# Patient Record
Sex: Male | Born: 1950 | Race: Black or African American | Hispanic: No | Marital: Married | Smoking: Current some day smoker
Health system: Southern US, Community
[De-identification: ages and names within clinical notes are randomized; demographics above are authoritative.]

## PROBLEM LIST (undated history)

## (undated) ENCOUNTER — Ambulatory Visit: Discharge status: Absent without leave | Payer: Medicare Other

## (undated) DIAGNOSIS — I2510 Atherosclerotic heart disease of native coronary artery without angina pectoris: Secondary | ICD-10-CM

## (undated) DIAGNOSIS — K449 Diaphragmatic hernia without obstruction or gangrene: Secondary | ICD-10-CM

## (undated) DIAGNOSIS — I10 Essential (primary) hypertension: Secondary | ICD-10-CM

## (undated) DIAGNOSIS — J189 Pneumonia, unspecified organism: Secondary | ICD-10-CM

## (undated) DIAGNOSIS — D649 Anemia, unspecified: Secondary | ICD-10-CM

## (undated) DIAGNOSIS — G4730 Sleep apnea, unspecified: Secondary | ICD-10-CM

## (undated) DIAGNOSIS — R011 Cardiac murmur, unspecified: Secondary | ICD-10-CM

## (undated) DIAGNOSIS — C189 Malignant neoplasm of colon, unspecified: Secondary | ICD-10-CM

## (undated) DIAGNOSIS — K219 Gastro-esophageal reflux disease without esophagitis: Secondary | ICD-10-CM

## (undated) DIAGNOSIS — M1990 Unspecified osteoarthritis, unspecified site: Secondary | ICD-10-CM

## (undated) HISTORY — DX: Sleep apnea, unspecified: G47.30

## (undated) HISTORY — DX: Gastro-esophageal reflux disease without esophagitis: K21.9

## (undated) HISTORY — PX: COLONOSCOPY: SHX174

## (undated) HISTORY — PX: ANKLE SURGERY: SHX546

## (undated) HISTORY — DX: Unspecified osteoarthritis, unspecified site: M19.90

## (undated) HISTORY — PX: HERNIA REPAIR: SHX51

## (undated) HISTORY — PX: CIRCUMCISION: SUR203

## (undated) HISTORY — DX: Anemia, unspecified: D64.9

---

## 1999-03-15 ENCOUNTER — Ambulatory Visit: Admission: RE | Admit: 1999-03-15 | Discharge: 1999-03-15 | Payer: Self-pay | Admitting: Cardiology

## 2000-05-23 ENCOUNTER — Encounter: Admission: RE | Admit: 2000-05-23 | Discharge: 2000-05-23 | Payer: Self-pay | Admitting: Cardiology

## 2000-05-23 ENCOUNTER — Encounter: Payer: Self-pay | Admitting: Cardiology

## 2004-09-06 ENCOUNTER — Ambulatory Visit (HOSPITAL_COMMUNITY): Admission: RE | Admit: 2004-09-06 | Discharge: 2004-09-06 | Payer: Self-pay | Admitting: Cardiology

## 2005-07-15 ENCOUNTER — Emergency Department (HOSPITAL_COMMUNITY): Admission: EM | Admit: 2005-07-15 | Discharge: 2005-07-15 | Payer: Self-pay | Admitting: Family Medicine

## 2006-04-04 ENCOUNTER — Ambulatory Visit (HOSPITAL_BASED_OUTPATIENT_CLINIC_OR_DEPARTMENT_OTHER): Admission: RE | Admit: 2006-04-04 | Discharge: 2006-04-04 | Payer: Self-pay | Admitting: Otolaryngology

## 2006-04-09 ENCOUNTER — Ambulatory Visit: Payer: Self-pay | Admitting: Internal Medicine

## 2006-05-16 ENCOUNTER — Encounter (HOSPITAL_COMMUNITY): Admission: RE | Admit: 2006-05-16 | Discharge: 2006-07-06 | Payer: Self-pay | Admitting: Cardiology

## 2006-09-05 ENCOUNTER — Ambulatory Visit (HOSPITAL_COMMUNITY): Admission: RE | Admit: 2006-09-05 | Discharge: 2006-09-05 | Payer: Self-pay | Admitting: Cardiology

## 2008-02-21 ENCOUNTER — Encounter: Admission: RE | Admit: 2008-02-21 | Discharge: 2008-02-21 | Payer: Self-pay | Admitting: Cardiology

## 2008-06-11 ENCOUNTER — Encounter: Admission: RE | Admit: 2008-06-11 | Discharge: 2008-06-11 | Payer: Self-pay | Admitting: Cardiology

## 2008-10-10 ENCOUNTER — Encounter (HOSPITAL_COMMUNITY): Admission: RE | Admit: 2008-10-10 | Discharge: 2008-10-21 | Payer: Self-pay | Admitting: Cardiology

## 2009-02-19 ENCOUNTER — Inpatient Hospital Stay (HOSPITAL_BASED_OUTPATIENT_CLINIC_OR_DEPARTMENT_OTHER): Admission: RE | Admit: 2009-02-19 | Discharge: 2009-02-19 | Payer: Self-pay | Admitting: Cardiology

## 2010-06-09 ENCOUNTER — Ambulatory Visit (HOSPITAL_COMMUNITY): Admission: RE | Admit: 2010-06-09 | Discharge: 2010-06-09 | Payer: Self-pay | Admitting: Cardiology

## 2010-11-13 ENCOUNTER — Encounter: Payer: Self-pay | Admitting: Cardiology

## 2010-11-14 ENCOUNTER — Encounter: Payer: Self-pay | Admitting: Cardiology

## 2011-01-04 ENCOUNTER — Inpatient Hospital Stay (INDEPENDENT_AMBULATORY_CARE_PROVIDER_SITE_OTHER)
Admission: RE | Admit: 2011-01-04 | Discharge: 2011-01-04 | Disposition: A | Discharge status: Home / Self Care | Payer: BC Managed Care – PPO | Source: Ambulatory Visit | Attending: Family Medicine | Admitting: Family Medicine

## 2011-01-04 DIAGNOSIS — J40 Bronchitis, not specified as acute or chronic: Secondary | ICD-10-CM

## 2011-03-08 NOTE — Cardiovascular Report (Signed)
George Watkins, George Watkins                ACCOUNT NO.:  [id number]   MEDICAL RECORD NO.:  [id number]          PATIENT TYPE:  OIB   LOCATION:  1963                         FACILITY:  [facility]   PHYSICIAN:  Mohan N. Sharyn Lull, M.D. DATE OF BIRTH:  02/08/1951   DATE OF PROCEDURE:  02/19/2009  DATE OF DISCHARGE:  02/19/2009                            CARDIAC CATHETERIZATION   PROCEDURE:  Left cardiac catheterization with selective left and right  coronary angiography, left ventriculography via right groin using  Judkins technique.   INDICATIONS FOR PROCEDURE:  George Watkins is 60 year old black male with  past medical history significant for coronary artery disease,  hypertension, morbid obesity, tobacco abuse, GERD, obesity,  hypoventilation syndrome, and obstructive sleep apnea.  Complains of  retrosternal and precordial chest pain off and on associated with  diaphoresis, chest pain resolves with rest.  Also, complains of  exertional dyspnea with minimal exertion, associated with tired and weak  feeling.  Denies any palpitation, lightheadedness, or syncope.  The  patient underwent Persantine Myoview on October 10, 2008, which showed  apical and lateral infarct with no evidence of ischemia.  The patient  was initially treated medically, but due to recurrent chest pain, was  referred for left cath.   PAST MEDICAL HISTORY:  As above.   PAST SURGICAL HISTORY:  1. He had left inguinal hernia repair.  2. Had circumcision in the past.  3. Had polyps of vocal cord resected in the past.  4. Had colonoscopy and polypectomy in the past.   ALLERGIES:  Allergic to SULFA.   MEDICATION AT HOME:  He is on:  1. Bystolic 10 mg daily.  2. Tekturna HCT 300/25 daily.  3. Exforge 10/160 daily.  4. Celebrex 200 mg daily.  5. Clonidine 0.2 mg daily.  6. Enteric-coated aspirin 81 mg p.o. daily.   SOCIAL HISTORY:  He is married and has 4 children.  Chews tobacco, half  pack everyday and drinks occasionally,  socially.  Works for [employer].   FAMILY HISTORY:  Father died of complications of pneumonia at the age of  73.  Mother died of stroke at age of 55, she was hypertensive.   PHYSICAL EXAMINATION:  GENERAL:  He was alert, awake, and oriented x3,  in no acute distress.  VITAL SIGNS:  Blood pressure was 140/84.  Pulse was 53 regular.  EYES:  Conjunctiva was pink.  NECK:  Supple.  No JVD or no bruit.  LUNGS:  Clear to auscultation without rhonchi or rales.  CARDIOVASCULAR:  S1 and S2 was normal.  There was soft systolic murmur.  There was no S3 gallop.  ABDOMEN:  Soft.  Bowel sounds are present, obese, and nontender.  EXTREMITIES:  There is no clubbing, cyanosis, or edema.   EKG showed normal sinus rhythm with LVH with nonspecific T-wave changes.  I discussed with the patient at length regarding left cardiac cath risks  and benefits, i.e., death, MI, stroke, need for emergency CABG, local  vascular complications, etc., and consented for the procedure.   PROCEDURE:  After obtaining informed consent, the patient was brought to  the cath lab and was placed on fluoroscopy table.  The right groin was  prepped and draped in usual fashion.  Xylocaine 2% was used for local  anesthesia in the right groin.  With the help of thin-wall needle, a 4-  French arterial sheath was placed.  The sheath was aspirated and  flushed.  Next, a 4-French left Judkins catheter was advanced over the  wire under fluoroscopic guidance up to the ascending aorta.  Wire was  pulled out, the catheter was aspirated and connected to the manifold.  Catheter was further advanced and engaged into left coronary ostium.  Multiple views of the left system were taken.  Next, the catheter was  disengaged and was pulled out over the wire and was replaced with 4-  [place] right Judkins catheter, which was advanced over the wire under  fluoroscopic guidance up to the ascending aorta.  Wire was pulled out,  the catheter was  aspirated and connected to the manifold.  Catheter was  further advanced and engaged into right coronary ostium.  Multiple views  of the right system were taken.  Next, the catheter was disengaged and  was pulled out over the wire and was replaced with 4-French pigtail  catheter, which was advanced over the wire under fluoroscopic guidance  up to the ascending aorta.  Wire was pulled out.  The catheter was  aspirated and connected to the manifold.  Catheter was further advanced  across the aortic valve into the LV.  LV pressures were recorded.  Next,  LV graft was done in 30-degree RAO position.  Post angiographic  pressures were recorded from LV and then pullback pressures were  recorded from the aorta.  There was no gradient across the aortic valve.  Next, the pigtail catheter was pulled out over the wire.  Sheaths were  aspirated and flushed.   FINDINGS:  LV was mildly enlarged, EF of approximately 50%.  Left main  was long, which was patent.  LAD has 10-15% mid stenosis.  Diagonal one  and two were small, which were patent.  Ramus was very very small, left  circumflex has 10-15% proximal stenosis.  OM1 and OM2 were very small.  OM3 was moderate size, which was patent.  OM4 was very small.  RCA has  10% proximal stenosis.  PDA was moderate size, which was patent.  PLV  branches were small, which were patent.  The patient tolerated the  procedure well.  There were no complications.  The patient was  transferred to recovery room in stable condition.      Eduardo Osier. Sharyn Lull, M.D.  Electronically Signed     MNH/MEDQ  D:  02/19/2009  T:  02/20/2009  Job:  [id number]   cc:   Genene Churn. Love, M.D.

## 2011-03-11 ENCOUNTER — Other Ambulatory Visit (HOSPITAL_COMMUNITY): Payer: Self-pay | Admitting: Cardiology

## 2011-03-11 NOTE — Procedures (Signed)
George Watkins, REASON                ACCOUNT NO.:  [id number]   MEDICAL RECORD NO.:  [id number]          PATIENT TYPE:  OUT   LOCATION:  SLEEP CENTER                 FACILITY:  [facility]   PHYSICIAN:  Clinton D. Maple Hudson, M.D. DATE OF BIRTH:  Dec 27, 1950   DATE OF STUDY:  04/04/2006                              NOCTURNAL POLYSOMNOGRAM   REFERRING PHYSICIAN:  Dr. Hermelinda Medicus.   INDICATIONS FOR STUDY:  Hypersomnia with sleep apnea.   EPWORTH SLEEPINESS SCORE:  17/24.   BMI:  40.9.   WEIGHT:  283 pounds.   HOME MEDICATIONS:  Diovan, labetalol, Tekturna, aspirin.   SLEEP ARCHITECTURE:  Total sleep time 303 minutes with sleep efficiency 74%.  Stage I was 8%, stage II 64%, stages III and IV were absent, REM was 28% of  total sleep time.  Sleep latency 31 minutes, REM latency 58 minutes, awake  after sleep onset 63 minutes arousal index 9.9.   No bedtime medication was taken.   RESPIRATORY DATA:  Split study protocol.  Apnea/hypopnea index (AHI, RDI)  50.3 obstructive events per hour indicating severe obstructive sleep  apnea/hypopnea syndrome before CPAP.  This included 20 obstructive apneas  and 97 hypopneas before CPAP.  He slept only supine and all events were  recorded in that position.  REM AHI 84.4.  CPAP was titrated to 19 CWP, AHI  0 per hour.  A large ResMed Quattro full-face mask was used with heated  humidifier.   OXYGEN DATA:  Moderate snoring with oxygen desaturation to a nadir of 70%  before CPAP.  After CPAP control saturation held 93-94% on room air.   CARDIAC DATA:  Sinus rhythm with occasional PAC and PVC.   MOVEMENT/PARASOMNIA:  Occasional limb jerk with arousal, insignificant.   IMPRESSION/RECOMMENDATIONS:  1.  Severe obstructive sleep apnea/hypopnea syndrome, AHI 50.3 per hour with      moderately loud snoring and oxygen desaturation to 70%.  All sleep and      events were while supine.  2.  Successful CPAP titration to 19 CWP, AHI 0 per hour.  A large ResMed     Quattro full-face mask was used with heated humidifier.      Clinton D. Maple Hudson, M.D.  Diplomate, Biomedical engineer of Sleep Medicine  Electronically Signed     CDY/MEDQ  D:  04/09/2006 09:42:39  T:  04/10/2006 10:36:29  Job:  [id number]

## 2011-03-25 ENCOUNTER — Encounter (HOSPITAL_COMMUNITY)
Admission: RE | Admit: 2011-03-25 | Discharge: 2011-03-25 | Disposition: A | Discharge status: Home / Self Care | Payer: BC Managed Care – PPO | Source: Ambulatory Visit | Attending: Cardiology | Admitting: Cardiology

## 2011-03-25 DIAGNOSIS — R079 Chest pain, unspecified: Secondary | ICD-10-CM | POA: Insufficient documentation

## 2011-03-25 MED ORDER — TECHNETIUM TC 99M TETROFOSMIN IV KIT
10.0000 | PACK | Freq: Once | INTRAVENOUS | Status: AC | PRN
  Administered 2011-03-25: 10 via INTRAVENOUS

## 2011-03-25 MED ORDER — TECHNETIUM TC 99M TETROFOSMIN IV KIT
30.0000 | PACK | Freq: Once | INTRAVENOUS | Status: AC | PRN
  Administered 2011-03-25: 30 via INTRAVENOUS

## 2011-03-31 ENCOUNTER — Inpatient Hospital Stay (HOSPITAL_BASED_OUTPATIENT_CLINIC_OR_DEPARTMENT_OTHER)
Admission: RE | Admit: 2011-03-31 | Discharge: 2011-03-31 | Disposition: A | Discharge status: Home / Self Care | Payer: BC Managed Care – PPO | Source: Ambulatory Visit | Attending: Cardiology | Admitting: Cardiology

## 2011-03-31 DIAGNOSIS — E662 Morbid (severe) obesity with alveolar hypoventilation: Secondary | ICD-10-CM | POA: Insufficient documentation

## 2011-03-31 DIAGNOSIS — F17200 Nicotine dependence, unspecified, uncomplicated: Secondary | ICD-10-CM | POA: Insufficient documentation

## 2011-03-31 DIAGNOSIS — I2510 Atherosclerotic heart disease of native coronary artery without angina pectoris: Secondary | ICD-10-CM | POA: Insufficient documentation

## 2011-03-31 DIAGNOSIS — G4733 Obstructive sleep apnea (adult) (pediatric): Secondary | ICD-10-CM | POA: Insufficient documentation

## 2011-03-31 DIAGNOSIS — R079 Chest pain, unspecified: Secondary | ICD-10-CM | POA: Insufficient documentation

## 2011-03-31 DIAGNOSIS — I10 Essential (primary) hypertension: Secondary | ICD-10-CM | POA: Insufficient documentation

## 2011-03-31 DIAGNOSIS — K219 Gastro-esophageal reflux disease without esophagitis: Secondary | ICD-10-CM | POA: Insufficient documentation

## 2011-04-14 NOTE — Cardiovascular Report (Signed)
NAMEHAYGEN, ZEBROWSKI                ACCOUNT NO.:  [id number]  MEDICAL RECORD NO.:  [id number]  LOCATION:  [facility]                          FACILITY:  [facility]  PHYSICIAN:  Katherina Wimer N. Sharyn Lull, M.D. DATE OF BIRTH:  1950/12/21  DATE OF PROCEDURE:  03/31/2011 DATE OF DISCHARGE:  03/25/2011                           CARDIAC CATHETERIZATION   PROCEDURE:  Left cardiac catheterization with selective left and right coronary angiography, left ventriculography via right groin using Judkins technique.  INDICATIONS FOR PROCEDURE:  Mr. Norfleet is a 60 year old black male with past medical history significant for coronary artery disease, hypertension, morbid obesity, tobacco abuse, GERD, obstructive sleep apnea, obesity hypoventilation syndrome, complains of recurrent retrosternal chest pain and left-sided chest pain lasting few minutes, relieves with rest.  The patient had Persantine Myoview in August 2011, which showed reversible ischemia in the midportion of the anteroseptal wall and was treated medically as the patient had left cath in March 2010, which showed mild coronary artery disease.  Due to recurrent chest pain again the patient underwent Persantine Myoview on March 25, 2011, which showed small area of reversible ischemia involving the inferoapical lateral wall with EF of 58%.  EKG done in my office showed sinus bradycardia with LVH with T-wave inversion in lateral leads.  The patient denies any relation of chest pain to food, breathing or movement.  PAST MEDICAL HISTORY:  As above.  PAST SURGICAL HISTORY:  He had left inguinal hernia repair in the past, had circumcision in the past, had vocal cord polyp infection in the past and colonoscopy and polypectomy in the past.  ALLERGIES:  Allergic to SULFA.  MEDICATIONS:  At home, he is on: 1. Metoprolol XL 50 mg daily. 2. Exforge 10/160 p.o. daily. 3. Clonidine 0.2 mg p.o. b.i.d. 4. Enteric-coated aspirin 81 mg p.o. daily. 5. Fish oil. 6.  Labetalol was discontinued due to marked bradycardia. 7. The patient was on to beta blockers.  PHYSICAL EXAMINATION:  GENERAL:  He is alert, awake, and oriented x3 in no acute distress. VITAL SIGNS:  Blood pressure was 140/80, pulse has improved from 48 to 68 regular sinus rhythm on the monitor. EYES:  Conjunctivae pink. NECK:  Supple.  No JVD.  No bruit. LUNGS:  Clear to auscultation without rhonchi or rales. CARDIOVASCULAR:  S1, S2 was normal.  There was soft systolic murmur and S3 gallop. ABDOMEN:  Soft.  Bowel sounds were present, nontender. EXTREMITIES:  There is no clubbing, cyanosis and edema.  IMPRESSION:  Recurrent chest pain and mildly positive Persantine Myoview, history of mild coronary artery disease in the past, hypertension, tobacco abuse, morbid obesity, obstructive sleep apnea. Discussed with the patient regarding Persantine Myoview result and left cardiac catheterization with selective left and right coronary angiography, left ventriculography, its risks and benefits i.e. death, myocardial infarction, stroke, need for emergency, coronary artery bypass grafting, peripheral vascular complications etc. and consented for left catheterization.  PROCEDURE:  After obtaining informed consent, the patient was brought to the cath lab and was placed on fluoroscopy table.  Right groin was prepped and draped in usual fashion.  Xylocaine 1% was used for local anesthesia in the right groin.  With the help of thin-wall needle,  a 4- [place] arterial sheath was placed.  The sheath was aspirated and flushed.  Next, 4-French left Judkins catheter was advanced over the wire under fluoroscopic guidance up to the ascending aorta.  Wire was pulled out, the catheter was aspirated and connected to the manifold. Catheter was further advanced and engaged into left coronary ostium. Multiple views of the left system were taken.  Next, catheter was disengaged and was pulled out over the wire and  was replaced with 4- [place] 3-D right diagnostic catheter which was advanced over the wire under fluoroscopic guidance up to the ascending aorta.  Wire was pulled out, the catheter was aspirated and connected to the manifold.  Catheter was further advanced and engaged into right coronary ostium.  Multiple views of the right system were taken.  Next, the catheter was disengaged and was pulled out over the wire and was replaced with 4-French pigtail catheter which was advanced over the wire under fluoroscopic guidance up to the ascending aorta.  Wire was pulled out, the catheter was aspirated and connected to the manifold.  Catheter was further advanced across the aortic valve into the LV.  LV pressures were recorded.  Next, LV graphy was done in 30-degree RAO position.  Post angiographic pressures were recorded from LV and then pullback pressures were recorded from the aorta.  There was no gradient across the aortic valve.  Next, the pigtail catheter was pulled out over the wire.  Sheaths were aspirated and flushed.  FINDINGS:  LV showed good LV systolic function, EF of 55-60%.  Left main was long which was patent.  LAD has 20-25% proximal stenosis and 10-15% mid stenosis.  Diagonal 1 and 2 were very small.  Left circumflex was large which has 5-10% proximal stenosis.  OM1 and OM-2 were very very small which were less than 0.5 mm.  OM-3  is large which is patent which bifurcates in the midportion where the inferior and superior branches were patent.  RCA was large which was patent.  PDA and PLV branches were small which were patent.  The patient tolerated the procedure well. There were no complications.  The patient was transferred to recovery room in stable condition.  The patient is acceptable risk for his ankle surgery which we will discuss with him also early next week.     Eduardo Osier. Sharyn Lull, M.D.     MNH/MEDQ  D:  03/31/2011  T:  03/31/2011  Job:  [id number]  Electronically  Signed by Rinaldo Cloud M.D. on 04/14/2011 09:42:57 AM

## 2011-07-29 LAB — LIPID PANEL
Cholesterol: 159 mg/dL (ref 0–200)
LDL Cholesterol: 112 mg/dL — ABNORMAL HIGH (ref 0–99)
Triglycerides: 80 mg/dL (ref <150)
VLDL: 16 mg/dL (ref 0–40)

## 2011-07-29 LAB — COMPREHENSIVE METABOLIC PANEL
AST: 25 U/L (ref 0–37)
Alkaline Phosphatase: 76 U/L (ref 39–117)
Calcium: 9 mg/dL (ref 8.4–10.5)
GFR calc non Af Amer: >60 mL/min (ref >60)

## 2011-07-29 LAB — URINALYSIS, ROUTINE W REFLEX MICROSCOPIC
Bilirubin Urine: NEGATIVE
Glucose, UA: NEGATIVE mg/dL
Protein, ur: NEGATIVE mg/dL
Urobilinogen, UA: 1 mg/dL (ref 0.0–1.0)

## 2011-07-29 LAB — CBC
HCT: 36.5 % — ABNORMAL LOW (ref 39.0–52.0)
RBC: 4.34 MIL/uL (ref 4.22–5.81)
WBC: 5.9 10*3/uL (ref 4.0–10.5)

## 2011-12-01 ENCOUNTER — Other Ambulatory Visit: Payer: Self-pay | Admitting: Cardiology

## 2016-11-11 ENCOUNTER — Encounter (HOSPITAL_COMMUNITY): Payer: Self-pay

## 2016-11-11 ENCOUNTER — Ambulatory Visit (HOSPITAL_COMMUNITY)
Admission: EM | Admit: 2016-11-11 | Discharge: 2016-11-11 | Disposition: A | Discharge status: Home / Self Care | Payer: Medicare Other | Attending: Family Medicine | Admitting: Family Medicine

## 2016-11-11 DIAGNOSIS — R059 Cough, unspecified: Secondary | ICD-10-CM

## 2016-11-11 DIAGNOSIS — R05 Cough: Secondary | ICD-10-CM | POA: Diagnosis not present

## 2016-11-11 DIAGNOSIS — J00 Acute nasopharyngitis [common cold]: Secondary | ICD-10-CM | POA: Diagnosis not present

## 2016-11-11 HISTORY — DX: Diaphragmatic hernia without obstruction or gangrene: K44.9

## 2016-11-11 HISTORY — DX: Cardiac murmur, unspecified: R01.1

## 2016-11-11 HISTORY — DX: Essential (primary) hypertension: I10

## 2016-11-11 MED ORDER — AZITHROMYCIN 250 MG PO TABS: 250.0000 mg | ORAL_TABLET | Freq: Every day | ORAL | 0 refills | Status: DC

## 2016-11-11 NOTE — ED Provider Notes (Signed)
CSN: [id number]     Arrival date & time 11/11/16  1623 History   First MD Initiated Contact with Patient 11/11/16 1843     Chief Complaint  Patient presents with  . Generalized Body Aches   (Consider location/radiation/quality/duration/timing/severity/associated sxs/prior Treatment) Patient c/o uri sx's and congestion for 3 days.    URI  Presenting symptoms: congestion, cough and fatigue   Severity:  Moderate Onset quality:  Sudden Duration:  3 days Timing:  Constant Progression:  Worsening Chronicity:  New   Past Medical History:  Diagnosis Date  . Heart murmur   . Hiatal hernia   . Hypertension    Past Surgical History:  Procedure Laterality Date  . CIRCUMCISION    . HERNIA REPAIR     No family history on file. Social History  Substance Use Topics  . Smoking status: Never Smoker  . Smokeless tobacco: Current User    Types: Chew  . Alcohol use No    Review of Systems  Constitutional: Positive for fatigue.  HENT: Positive for congestion.   Eyes: Negative.   Respiratory: Positive for cough.   Cardiovascular: Negative.   Gastrointestinal: Negative.   Endocrine: Negative.   Genitourinary: Negative.   Musculoskeletal: Negative.   Allergic/Immunologic: Negative.   Neurological: Negative.   Hematological: Negative.   Psychiatric/Behavioral: Negative.     Allergies  Sulfa antibiotics  Home Medications   Prior to Admission medications   Medication Sig Start Date End Date Taking? Authorizing Provider  amLODipine-valsartan (EXFORGE) 5-160 MG tablet Take 1 tablet by mouth daily.   Yes Historical Provider, MD  aspirin 81 MG chewable tablet Chew by mouth daily.   Yes Historical Provider, MD  cloNIDine (CATAPRES) 0.2 MG tablet Take 0.2 mg by mouth 2 (two) times daily.   Yes Historical Provider, MD  labetalol (NORMODYNE) 200 MG tablet Take 200 mg by mouth 2 (two) times daily.   Yes Historical Provider, MD  azithromycin (ZITHROMAX) 250 MG tablet Take 1 tablet (250  mg total) by mouth daily. Take first 2 tablets together, then 1 every day until finished. 11/11/16   Lysbeth Penner, FNP   Meds Ordered and Administered this Visit  Medications - No data to display  BP (!) 145/101 (BP Location: Right Arm)   Pulse (!) 59   Temp 98.2 F (36.8 C) (Oral)   Resp 20   SpO2 99%  No data found.   Physical Exam  Constitutional: He appears well-developed and well-nourished.  HENT:  Head: Normocephalic and atraumatic.  Right Ear: External ear normal.  Left Ear: External ear normal.  Mouth/Throat: Oropharynx is clear and moist.  Eyes: Conjunctivae and EOM are normal. Pupils are equal, round, and reactive to light.  Neck: Normal range of motion. Neck supple.  Cardiovascular: Normal rate, regular rhythm and normal heart sounds.   Pulmonary/Chest: Effort normal and breath sounds normal.  Abdominal: Soft. Bowel sounds are normal.  Vitals reviewed.   Urgent Care Course     Procedures (including critical care time)  Labs Review Labs Reviewed - No data to display  Imaging Review No results found.   Visual Acuity Review  Right Eye Distance:   Left Eye Distance:   Bilateral Distance:    Right Eye Near:   Left Eye Near:    Bilateral Near:         MDM   1. Acute nasopharyngitis   2. Cough    Zpak Offered Tamiflu since wife has flu and he has been having sx's but  refuses Continue current cough and cold meds    Lysbeth Penner, FNP 11/11/16 [phone]

## 2016-11-11 NOTE — ED Triage Notes (Signed)
Pt said he had a cold but now having body aches for over a week. Pain radiates and now having hot/cold flashes. Michela Pitcher his wife has the flu. Cough but no sore throat. Taking otc medication for cold and flu. No fever today.

## 2018-12-11 ENCOUNTER — Ambulatory Visit
Admission: EM | Admit: 2018-12-11 | Discharge: 2018-12-11 | Disposition: A | Discharge status: Home / Self Care | Payer: Medicare Other | Attending: Family Medicine | Admitting: Family Medicine

## 2018-12-11 ENCOUNTER — Ambulatory Visit (INDEPENDENT_AMBULATORY_CARE_PROVIDER_SITE_OTHER): Payer: Medicare Other

## 2018-12-11 DIAGNOSIS — S29011A Strain of muscle and tendon of front wall of thorax, initial encounter: Secondary | ICD-10-CM | POA: Diagnosis not present

## 2018-12-11 DIAGNOSIS — M5489 Other dorsalgia: Secondary | ICD-10-CM | POA: Diagnosis not present

## 2018-12-11 MED ORDER — PREDNISONE 20 MG PO TABS: ORAL_TABLET | ORAL | 0 refills | Status: DC

## 2018-12-11 NOTE — ED Triage Notes (Signed)
Pt c/o rt to middle back pain since Saturday, denies injury

## 2018-12-11 NOTE — ED Provider Notes (Signed)
[facility]    CSN: [id number] Arrival date & time: 12/11/18  1031     History   Chief Complaint Chief Complaint  Patient presents with  . Back Pain    HPI George Watkins is a 68 y.o. male.   Pt c/o rt to middle back pain since Saturday, denies injury.  Pain is sharp stabbing quality, worse with deep breath or movement.  Has a h/o pneumonia, but no fever or cough lately  No nausea or abdominal pain  Patient works 2 hours daily as custodian     Past Medical History:  Diagnosis Date  . Heart murmur   . Hiatal hernia   . Hypertension     There are no active problems to display for this patient.   Past Surgical History:  Procedure Laterality Date  . CIRCUMCISION    . HERNIA REPAIR         Home Medications    Prior to Admission medications   Medication Sig Start Date End Date Taking? Authorizing Provider  amLODipine-valsartan (EXFORGE) 5-160 MG tablet Take 1 tablet by mouth daily.    [provider]  aspirin 81 MG chewable tablet Chew by mouth daily.    [provider]  cloNIDine (CATAPRES) 0.2 MG tablet Take 0.2 mg by mouth 2 (two) times daily.    [provider]  labetalol (NORMODYNE) 200 MG tablet Take 200 mg by mouth 2 (two) times daily.    [provider]  predniSONE (DELTASONE) 20 MG tablet Two daily with food 12/11/18   Robyn Haber, MD    Family History No family history on file.  Social History Social History   Tobacco Use  . Smoking status: Never Smoker  . Smokeless tobacco: Current User    Types: Chew  Substance Use Topics  . Alcohol use: No  . Drug use: Not on file     Allergies   Sulfa antibiotics   Review of Systems Review of Systems   Physical Exam Triage Vital Signs ED Triage Vitals [12/11/18 1103]  Enc Vitals Group     BP (!) 156/88     Pulse Rate (!) 54     Resp 18     Temp 98.1 F (36.7 C)     Temp Source Oral     SpO2 97 %     Weight      Height      Head  Circumference      Peak Flow      Pain Score 6     Pain Loc      Pain Edu?      Excl. in [place]?    No data found.  Updated Vital Signs BP (!) 156/88 (BP Location: Left Arm)   Pulse (!) 54   Temp 98.1 F (36.7 C) (Oral)   Resp 18   SpO2 97%    Physical Exam Vitals signs and nursing note reviewed.  Constitutional:      Appearance: Normal appearance. He is obese.  HENT:     Head: Normocephalic.     Nose: Nose normal.     Mouth/Throat:     Mouth: Mucous membranes are moist.     Pharynx: Oropharynx is clear.  Eyes:     Pupils: Pupils are equal, round, and reactive to light.  Neck:     Musculoskeletal: Normal range of motion and neck supple.  Cardiovascular:     Rate and Rhythm: Normal rate and regular rhythm.  Pulmonary:  Effort: Pulmonary effort is normal.     Breath sounds: Normal breath sounds.  Musculoskeletal: Normal range of motion.  Skin:    General: Skin is warm and dry.  Neurological:     General: No focal deficit present.     Mental Status: He is alert.  Psychiatric:        Mood and Affect: Mood normal.      UC Treatments / Results  Labs (all labs ordered are listed, but only abnormal results are displayed) Labs Reviewed - No data to display  EKG None  Radiology Dg Chest 2 View  Result Date: 12/11/2018 CLINICAL DATA:  Patient complains of right upper and mid back pain with occasional movement to the left side X 3-4 days. Patient denies cough, congestion, SOB. Hx of Pneumonia and HTN. Former smoker. EXAM: CHEST - 2 VIEW COMPARISON:  06/11/2008 FINDINGS: The heart is mildly enlarged. There are no focal consolidations or pleural effusions. No pulmonary edema. IMPRESSION: Mild cardiomegaly. Electronically Signed   By: Nolon Nations M.D.   On: 12/11/2018 12:21    Procedures Procedures (including critical care time)  Medications Ordered in UC Medications - No data to display  Initial Impression / Assessment and Plan / UC Course  I have reviewed  the triage vital signs and the nursing notes.  Pertinent labs & imaging results that were available during my care of the patient were reviewed by me and considered in my medical decision making (see chart for details).    Final Clinical Impressions(s) / UC Diagnoses   Final diagnoses:  Muscle strain of chest wall, initial encounter   Discharge Instructions   None    ED Prescriptions    Medication Sig Dispense Auth. Provider   predniSONE (DELTASONE) 20 MG tablet Two daily with food 10 tablet Robyn Haber, MD     Controlled Substance Prescriptions [place] Controlled Substance Registry consulted? Not Applicable   Robyn Haber, MD 12/11/18 1229

## 2018-12-17 ENCOUNTER — Ambulatory Visit
Admission: EM | Admit: 2018-12-17 | Discharge: 2018-12-17 | Disposition: A | Discharge status: Home / Self Care | Payer: Medicare Other | Attending: Nurse Practitioner | Admitting: Nurse Practitioner

## 2018-12-17 ENCOUNTER — Encounter: Payer: Self-pay | Admitting: Emergency Medicine

## 2018-12-17 ENCOUNTER — Ambulatory Visit (INDEPENDENT_AMBULATORY_CARE_PROVIDER_SITE_OTHER): Payer: Medicare Other

## 2018-12-17 DIAGNOSIS — M5489 Other dorsalgia: Secondary | ICD-10-CM

## 2018-12-17 MED ORDER — KETOROLAC TROMETHAMINE 60 MG/2ML IM SOLN
60.0000 mg | Freq: Once | INTRAMUSCULAR | Status: AC
  Administered 2018-12-17: 60 mg via INTRAMUSCULAR

## 2018-12-17 MED ORDER — DICLOFENAC POTASSIUM 50 MG PO TABS: 50.0000 mg | ORAL_TABLET | Freq: Three times a day (TID) | ORAL | 0 refills | Status: DC | PRN

## 2018-12-17 MED ORDER — METHYLPREDNISOLONE SODIUM SUCC 125 MG IJ SOLR
125.0000 mg | Freq: Once | INTRAMUSCULAR | Status: AC
  Administered 2018-12-17: 125 mg via INTRAMUSCULAR

## 2018-12-17 NOTE — ED Triage Notes (Addendum)
Pt presents to [facility] for assessment of continued back pain since his previous visit.  Finished his last steroid pill yesterday.  States he was feeling better, but flared up again Saturday.  States last night he could barely walk around.  Pt states pain radiates from RUQ to his back and between.

## 2018-12-17 NOTE — ED Provider Notes (Signed)
[facility]    CSN: [id number] Arrival date & time: 12/17/18  1114     History   Chief Complaint Chief Complaint  Patient presents with  . Back Pain    HPI George Watkins is a 68 y.o. male.   Subjective:  George Watkins is a 68 y.o. male who presents for evaluation of low back pain. The patient has had recurrent self limited episodes of low back pain in the past as he was seen most recently on 12/11/2018 for similar complaints.  He underwent a chest x-ray at that time which was negative for anything acute.  He was prescribed a short course of steroids which was not much helpful for his pain.  Patient presents again today with thoracic back pain with radiation to the right.  Symptoms have been present for 2 weeks and are unchanged.  Onset was related to / precipitated by no known injury. The pain is described as burning and tightness and occurs intermittently. He rates his pain as moderate. Symptoms are exacerbated by twisting, standing up and various body movements. Symptoms are improved by rest. He denies any weakness in the legs, tingling in the legs, burning pain in the legs, urinary incontinence, bowel incontinence or groin/perineal numbness associated with the back pain. The patient has no "red flag" history indicative of complicated back pain.  The following portions of the patient's history were reviewed and updated as appropriate: allergies, current medications, past family history, past medical history, past social history, past surgical history and problem list.         Past Medical History:  Diagnosis Date  . Heart murmur   . Hiatal hernia   . Hypertension     There are no active problems to display for this patient.   Past Surgical History:  Procedure Laterality Date  . CIRCUMCISION    . HERNIA REPAIR         Home Medications    Prior to Admission medications   Medication Sig Start Date End Date Taking? Authorizing Provider    amLODipine-valsartan (EXFORGE) 5-160 MG tablet Take 1 tablet by mouth daily.   Yes [provider]  aspirin 81 MG chewable tablet Chew by mouth daily.   Yes [provider]  cloNIDine (CATAPRES) 0.2 MG tablet Take 0.2 mg by mouth 2 (two) times daily.   Yes [provider]  labetalol (NORMODYNE) 200 MG tablet Take 200 mg by mouth 2 (two) times daily.   Yes [provider]  predniSONE (DELTASONE) 20 MG tablet Two daily with food 12/11/18  Yes Robyn Haber, MD  diclofenac (CATAFLAM) 50 MG tablet Take 1 tablet (50 mg total) by mouth 3 (three) times daily as needed (pain). 12/17/18   Enrique Sack, FNP    Family History History reviewed. No pertinent family history.  Social History Social History   Tobacco Use  . Smoking status: Never Smoker  . Smokeless tobacco: Current User    Types: Chew  Substance Use Topics  . Alcohol use: No  . Drug use: Not on file     Allergies   Sulfa antibiotics   Review of Systems Review of Systems  Genitourinary: Negative.   Musculoskeletal: Positive for back pain.  Neurological: Negative.   All other systems reviewed and are negative.    Physical Exam Triage Vital Signs ED Triage Vitals  Enc Vitals Group     BP 12/17/18 1151 (!) 159/93     Pulse Rate 12/17/18 1151 60  Resp 12/17/18 1151 18     Temp 12/17/18 1151 97.7 F (36.5 C)     Temp Source 12/17/18 1151 Oral     SpO2 12/17/18 1151 95 %     Weight --      Height --      Head Circumference --      Peak Flow --      Pain Score 12/17/18 1152 5     Pain Loc --      Pain Edu? --      Excl. in [place]? --    No data found.  Updated Vital Signs BP (!) 159/93 (BP Location: Left Arm)   Pulse 60   Temp 97.7 F (36.5 C) (Oral)   Resp 18   SpO2 95%   Visual Acuity Right Eye Distance:   Left Eye Distance:   Bilateral Distance:    Right Eye Near:   Left Eye Near:    Bilateral Near:     Physical Exam Vitals signs reviewed.   Constitutional:      General: He is not in acute distress.    Appearance: Normal appearance. He is not ill-appearing, toxic-appearing or diaphoretic.  HENT:     Head: Normocephalic.  Neck:     Musculoskeletal: Normal range of motion and neck supple.  Cardiovascular:     Rate and Rhythm: Normal rate and regular rhythm.  Pulmonary:     Effort: Pulmonary effort is normal.     Breath sounds: Normal breath sounds.  Musculoskeletal: Normal range of motion.     Comments: Full range of motion without pain, no tenderness, no spasm, no curvature. Normal reflexes, gait, strength and negative straight-leg raise. Muscle tone and ROM exam: muscle tone normal without spasm, full range of motion with pain. Straight leg raise: negative    Skin:    General: Skin is warm and dry.  Neurological:     General: No focal deficit present.     Mental Status: He is alert and oriented to person, place, and time.     Comments: Normal DTRs, muscle strength and reflexes.   Psychiatric:        Mood and Affect: Mood normal.        Behavior: Behavior normal.        Thought Content: Thought content normal.        Judgment: Judgment normal.      UC Treatments / Results  Labs (all labs ordered are listed, but only abnormal results are displayed) Labs Reviewed - No data to display  EKG None  Radiology Dg Thoracic Spine 2 View  Result Date: 12/17/2018 CLINICAL DATA:  Midthoracic pain 2 weeks. No known injury. Pain radiating to RIGHT side. EXAM: THORACIC SPINE 2 VIEWS COMPARISON:  Chest x-ray dated 06/11/2008. FINDINGS: Mild dextroscoliosis of the thoracic spine. No evidence of acute vertebral body subluxation. No acute or suspicious osseous lesion. No fracture line or displaced fracture fragment seen. Mild degenerative spurring within the mid and lower thoracic spine. There is some prominence/displacement of the LEFT paraspinal line. IMPRESSION: 1. Prominence/displacement of the LEFT paraspinal line, of  uncertain significance, but can be associated with spinal or paraspinal pathology. Recommend thoracic spine CT or MRI for further characterization. 2. Mild scoliosis. 3. No acute appearing osseous abnormality. These results will be called to the ordering clinician or representative by the Radiologist Assistant, and communication documented in the PACS or zVision Dashboard. Electronically Signed   By: Franki Cabot M.D.   On: 12/17/2018 13:06  Procedures Procedures (including critical care time)  Medications Ordered in UC Medications  ketorolac (TORADOL) injection 60 mg (has no administration in time range)  methylPREDNISolone sodium succinate (SOLU-MEDROL) 125 mg/2 mL injection 125 mg (has no administration in time range)    Initial Impression / Assessment and Plan / UC Course  I have reviewed the triage vital signs and the nursing notes.  Pertinent labs & imaging results that were available during my care of the patient were reviewed by me and considered in my medical decision making (see chart for details).     68 year old male presenting with thoracic pain the radiation to the right side for 2 weeks.  No known injury noted.  Not much relief after short course of steroids.  X-ray today of the thoracic spine shows prominence/displacement of the left paraspinal line, of uncertain significance, but can be associated with spinal or paraspinal pathology. Thoracic spine CT or MRI for further characterization is recommended.  Patient given injection of Solu-Medrol and Toradol in the clinic.  Rx for diclofenac 3 times daily as needed.  Warm compresses to affected areas.  Avoid heavy lifting or pulling.  Patient should follow-up with orthopedics as soon as possible.  Today's evaluation has revealed no signs of a dangerous process. Discussed diagnosis with patient. Patient aware of their diagnosis, possible red flag symptoms to watch out for and need for close follow up. Patient understands verbal and  written discharge instructions. Patient comfortable with plan and disposition.  Patient has a clear mental status at this time, good insight into illness (after discussion and teaching) and has clear judgment to make decisions regarding their care.  Documentation was completed with the aid of voice recognition software. Transcription may contain typographical errors.  Final Clinical Impressions(s) / UC Diagnoses   Final diagnoses:  Pain in paraspinal region     Discharge Instructions     Take medications as directed.  Alternate between ice and heat to affected areas.  Avoid heavy lifting and pulling.  Follow-up with orthopedics as soon as possible.  Call today to make an appointment.    ED Prescriptions    Medication Sig Dispense Auth. Provider   diclofenac (CATAFLAM) 50 MG tablet Take 1 tablet (50 mg total) by mouth 3 (three) times daily as needed (pain). 30 tablet Enrique Sack, FNP     Controlled Substance Prescriptions [place] Controlled Substance Registry consulted? Not Applicable   Enrique Sack, [place] 12/17/18 1329

## 2018-12-17 NOTE — Discharge Instructions (Addendum)
Take medications as directed.  Alternate between ice and heat to affected areas.  Avoid heavy lifting and pulling.  Follow-up with orthopedics as soon as possible.  Call today to make an appointment.

## 2018-12-17 NOTE — ED Notes (Signed)
Explained delay and offered water to patient.

## 2018-12-17 NOTE — ED Notes (Signed)
Patient able to ambulate independently  

## 2018-12-24 DIAGNOSIS — M546 Pain in thoracic spine: Secondary | ICD-10-CM | POA: Insufficient documentation

## 2020-03-31 ENCOUNTER — Ambulatory Visit
Admission: EM | Admit: 2020-03-31 | Discharge: 2020-03-31 | Disposition: A | Discharge status: Home / Self Care | Payer: Medicare PPO | Attending: Physician Assistant | Admitting: Physician Assistant

## 2020-03-31 ENCOUNTER — Other Ambulatory Visit: Payer: Self-pay

## 2020-03-31 DIAGNOSIS — K130 Diseases of lips: Secondary | ICD-10-CM

## 2020-03-31 DIAGNOSIS — K069 Disorder of gingiva and edentulous alveolar ridge, unspecified: Secondary | ICD-10-CM

## 2020-03-31 MED ORDER — CHLORHEXIDINE GLUCONATE 0.12 % MT SOLN: 15.0000 mL | Freq: Two times a day (BID) | OROMUCOSAL | 0 refills | Status: DC

## 2020-03-31 MED ORDER — MICONAZOLE NITRATE 2 % EX CREA: 1.0000 "application " | TOPICAL_CREAM | Freq: Two times a day (BID) | CUTANEOUS | 0 refills | Status: DC

## 2020-03-31 MED ORDER — LIDOCAINE VISCOUS HCL 2 % MT SOLN: 15.0000 mL | OROMUCOSAL | 0 refills | Status: DC | PRN

## 2020-03-31 NOTE — ED Triage Notes (Signed)
Pt c/o soreness/raw to both corners on lips/mouth and soreness to gums x2-3 wks.

## 2020-03-31 NOTE — Discharge Instructions (Signed)
Start Peridex as directed for dental hygiene.  Lidocaine solution to help with numbing.  Miconazole cream to the corner of the lips to help with possible fungal infection.  You can also apply barrier cream corner of the lip to prevent recurring symptoms. Keep hydrated, urine should be clear to pale yellow in color.  Follow-up with dentist for further evaluation and management needed.

## 2020-03-31 NOTE — ED Provider Notes (Signed)
[facility]    CSN: [id number] Arrival date & time: 03/31/20  1538      History   Chief Complaint Chief Complaint  Patient presents with  . mouth pain    HPI George Watkins is a 69 y.o. male.   69 year old male comes in for 2-3 week history of mouth soreness. Has pain to the corner of the lips, lower gumline.  Wears dentures to his top teeth, removes daily for cleaning.  Has been doing this more frequently since pain started.  He denies any trouble swallowing, sore throat, fever.  Has been doing saltwater rinses, mouth wash rinses without relief.     Past Medical History:  Diagnosis Date  . Heart murmur   . Hiatal hernia   . Hypertension     There are no problems to display for this patient.   Past Surgical History:  Procedure Laterality Date  . CIRCUMCISION    . HERNIA REPAIR         Home Medications    Prior to Admission medications   Medication Sig Start Date End Date Taking? Authorizing Provider  amLODipine-valsartan (EXFORGE) 5-160 MG tablet Take 1 tablet by mouth daily.    [provider]  aspirin 81 MG chewable tablet Chew by mouth daily.    [provider]  chlorhexidine (PERIDEX) 0.12 % solution Use as directed 15 mLs in the mouth or throat 2 (two) times daily. 03/31/20   Tasia Catchings, Kohler Pellerito V, PA-C  cloNIDine (CATAPRES) 0.2 MG tablet Take 0.2 mg by mouth 2 (two) times daily.    [provider]  labetalol (NORMODYNE) 200 MG tablet Take 200 mg by mouth 2 (two) times daily.    [provider]  lidocaine (XYLOCAINE) 2 % solution Use as directed 15 mLs in the mouth or throat as needed for mouth pain. 03/31/20   Tasia Catchings, Sewell Pitner V, PA-C  miconazole (MICOTIN) 2 % cream Apply 1 application topically 2 (two) times daily. To the corner of the mouth sparingly for 5-7 days. 03/31/20   Ok Edwards, PA-C    Family History History reviewed. No pertinent family history.  Social History Social History   Tobacco Use  . Smoking status: Current  Some Day Smoker    Types: Cigars  . Smokeless tobacco: Current User    Types: Chew  Substance Use Topics  . Alcohol use: Yes  . Drug use: Not Currently     Allergies   Sulfa antibiotics   Review of Systems Review of Systems  Reason unable to perform ROS: See HPI as above.     Physical Exam Triage Vital Signs ED Triage Vitals  Enc Vitals Group     BP 03/31/20 1550 128/81     Pulse Rate 03/31/20 1550 83     Resp 03/31/20 1550 18     Temp 03/31/20 1550 98.3 F (36.8 C)     Temp Source 03/31/20 1550 Oral     SpO2 03/31/20 1550 98 %     Weight --      Height --      Head Circumference --      Peak Flow --      Pain Score 03/31/20 1554 0     Pain Loc --      Pain Edu? --      Excl. in [place]? --    No data found.  Updated Vital Signs BP 128/81 (BP Location: Left Arm)   Pulse 83   Temp  98.3 F (36.8 C) (Oral)   Resp 18   SpO2 98%   Physical Exam Constitutional:      General: He is not in acute distress.    Appearance: Normal appearance. He is well-developed. He is not ill-appearing, toxic-appearing or diaphoretic.  HENT:     Head: Normocephalic and atraumatic.     Jaw: No trismus.     Mouth/Throat:     Mouth: Mucous membranes are moist.     Pharynx: Oropharynx is clear. Uvula midline. No uvula swelling.     Tonsils: No tonsillar exudate.     Comments: Redness, skin cracking to the corner of bilateral lips. Dentures removed. No gum swelling noted. receeding gumline with plaques noted to the bottom teeth.   Floor of mouth soft to palpation.  No facial swelling. Eyes:     Conjunctiva/sclera: Conjunctivae normal.     Pupils: Pupils are equal, round, and reactive to light.  Pulmonary:     Effort: Pulmonary effort is normal. No respiratory distress.     Comments: Speaking in full sentences without difficulty Musculoskeletal:     Cervical back: Normal range of motion and neck supple.  Skin:    General: Skin is warm and dry.  Neurological:     Mental Status: He  is alert and oriented to person, place, and time.      UC Treatments / Results  Labs (all labs ordered are listed, but only abnormal results are displayed) Labs Reviewed - No data to display  EKG   Radiology No results found.  Procedures Procedures (including critical care time)  Medications Ordered in UC Medications - No data to display  Initial Impression / Assessment and Plan / UC Course  I have reviewed the triage vital signs and the nursing notes.  Pertinent labs & imaging results that were available during my care of the patient were reviewed by me and considered in my medical decision making (see chart for details).    Will treat for angular cheilitis with miconazole, barrier cream.  Peridex/lidocaine for gum discomfort.  Discussed to follow-up with dentist for further evaluation of gum disease.  Return precautions given.  Final Clinical Impressions(s) / UC Diagnoses   Final diagnoses:  Angular cheilitis  Gum disease   ED Prescriptions    Medication Sig Dispense Auth. Provider   chlorhexidine (PERIDEX) 0.12 % solution Use as directed 15 mLs in the mouth or throat 2 (two) times daily. 120 mL Natahlia Hoggard V, PA-C   lidocaine (XYLOCAINE) 2 % solution Use as directed 15 mLs in the mouth or throat as needed for mouth pain. 100 mL Parisa Pinela V, PA-C   miconazole (MICOTIN) 2 % cream Apply 1 application topically 2 (two) times daily. To the corner of the mouth sparingly for 5-7 days. 14 g Ok Edwards, PA-C     PDMP not reviewed this encounter.   Ok Edwards, PA-C 03/31/20 1730

## 2020-05-22 ENCOUNTER — Encounter (HOSPITAL_BASED_OUTPATIENT_CLINIC_OR_DEPARTMENT_OTHER): Payer: Self-pay | Admitting: *Deleted

## 2020-05-22 ENCOUNTER — Emergency Department (HOSPITAL_BASED_OUTPATIENT_CLINIC_OR_DEPARTMENT_OTHER)
Admission: EM | Admit: 2020-05-22 | Discharge: 2020-05-22 | Disposition: A | Discharge status: Home / Self Care | Payer: Medicare PPO | Attending: Emergency Medicine | Admitting: Emergency Medicine

## 2020-05-22 ENCOUNTER — Other Ambulatory Visit: Payer: Self-pay

## 2020-05-22 DIAGNOSIS — F17290 Nicotine dependence, other tobacco product, uncomplicated: Secondary | ICD-10-CM | POA: Insufficient documentation

## 2020-05-22 DIAGNOSIS — D649 Anemia, unspecified: Secondary | ICD-10-CM | POA: Diagnosis present

## 2020-05-22 DIAGNOSIS — I10 Essential (primary) hypertension: Secondary | ICD-10-CM | POA: Diagnosis not present

## 2020-05-22 LAB — FERRITIN: Ferritin: 3 ng/mL — ABNORMAL LOW (ref 24–336)

## 2020-05-22 LAB — OCCULT BLOOD X 1 CARD TO LAB, STOOL: Fecal Occult Bld: NEGATIVE

## 2020-05-22 LAB — COMPREHENSIVE METABOLIC PANEL
ALT: 23 U/L (ref 0–44)
AST: 22 U/L (ref 15–41)
Albumin: 3.7 g/dL (ref 3.5–5.0)
Alkaline Phosphatase: 63 U/L (ref 38–126)
Anion gap: 11 (ref 5–15)
BUN: 14 mg/dL (ref 8–23)
CO2: 26 mmol/L (ref 22–32)
Calcium: 9 mg/dL (ref 8.9–10.3)
Chloride: 102 mmol/L (ref 98–111)
Creatinine, Ser: 1.34 mg/dL — ABNORMAL HIGH (ref 0.61–1.24)
GFR calc Af Amer: >60 mL/min (ref >60)
GFR calc non Af Amer: 54 mL/min — ABNORMAL LOW (ref >60)
Glucose, Bld: 104 mg/dL — ABNORMAL HIGH (ref 70–99)
Potassium: 3.1 mmol/L — ABNORMAL LOW (ref 3.5–5.1)
Sodium: 139 mmol/L (ref 135–145)
Total Bilirubin: 0.5 mg/dL (ref 0.3–1.2)
Total Protein: 8.1 g/dL (ref 6.5–8.1)

## 2020-05-22 LAB — CBC WITH DIFFERENTIAL/PLATELET
Abs Immature Granulocytes: 0.03 10*3/uL (ref 0.00–0.07)
Basophils Absolute: 0.1 10*3/uL (ref 0.0–0.1)
Basophils Relative: 1 %
Eosinophils Absolute: 0.2 10*3/uL (ref 0.0–0.5)
Eosinophils Relative: 2 %
HCT: 26.3 % — ABNORMAL LOW (ref 39.0–52.0)
Hemoglobin: 6.9 g/dL — CL (ref 13.0–17.0)
Immature Granulocytes: 0 %
Lymphocytes Relative: 20 %
Lymphs Abs: 1.9 10*3/uL (ref 0.7–4.0)
MCH: 16 pg — ABNORMAL LOW (ref 26.0–34.0)
MCHC: 26.2 g/dL — ABNORMAL LOW (ref 30.0–36.0)
MCV: 61.2 fL — ABNORMAL LOW (ref 80.0–100.0)
Monocytes Absolute: 1.2 10*3/uL — ABNORMAL HIGH (ref 0.1–1.0)
Monocytes Relative: 13 %
Neutro Abs: 6 10*3/uL (ref 1.7–7.7)
Neutrophils Relative %: 64 %
Platelets: 349 10*3/uL (ref 150–400)
RBC: 4.3 MIL/uL (ref 4.22–5.81)
RDW: 20.2 % — ABNORMAL HIGH (ref 11.5–15.5)
WBC: 9.4 10*3/uL (ref 4.0–10.5)
nRBC: 0 % (ref 0.0–0.2)

## 2020-05-22 LAB — IRON AND TIBC
Iron: 14 ug/dL — ABNORMAL LOW (ref 45–182)
Saturation Ratios: 3 % — ABNORMAL LOW (ref 17.9–39.5)
TIBC: 475 ug/dL — ABNORMAL HIGH (ref 250–450)
UIBC: 461 ug/dL

## 2020-05-22 LAB — RETICULOCYTES
Immature Retic Fract: 29.3 % — ABNORMAL HIGH (ref 2.3–15.9)
RBC.: 4.32 MIL/uL (ref 4.22–5.81)
Retic Count, Absolute: 81.2 10*3/uL (ref 19.0–186.0)
Retic Ct Pct: 1.9 % (ref 0.4–3.1)

## 2020-05-22 LAB — VITAMIN B12: Vitamin B-12: 367 pg/mL (ref 180–914)

## 2020-05-22 MED ORDER — POTASSIUM CHLORIDE CRYS ER 20 MEQ PO TBCR
40.0000 meq | EXTENDED_RELEASE_TABLET | Freq: Once | ORAL | Status: AC
  Administered 2020-05-22: 40 meq via ORAL
  Filled 2020-05-22: qty 2

## 2020-05-22 MED ORDER — FERROUS SULFATE 325 (65 FE) MG PO TABS
325.0000 mg | ORAL_TABLET | Freq: Two times a day (BID) | ORAL | 0 refills | Status: AC
Start: 2020-05-22 — End: ?

## 2020-05-22 NOTE — ED Provider Notes (Signed)
[facility] Provider Note   CSN: [id number] Arrival date & time: 05/22/20  1718     History Chief Complaint  Patient presents with  . Abnormal Lab    George Watkins is a 69 y.o. male.  HPI 70 year old male presents with anemia.  The patient states that he went to see a PCP for the 1st time as he was changing PCPs and they checked blood work.  Called him today with anemia.  He states he feels fine and denies dizziness/lightheadedness, chest pain, shortness of breath, fatigue.  He denies any known blood in his stool or black stools.  Notes prior history of the same.   Past Medical History:  Diagnosis Date  . Heart murmur   . Hiatal hernia   . Hypertension     There are no problems to display for this patient.   Past Surgical History:  Procedure Laterality Date  . CIRCUMCISION    . HERNIA REPAIR         History reviewed. No pertinent family history.  Social History   Tobacco Use  . Smoking status: Current Some Day Smoker    Types: Cigars  . Smokeless tobacco: Current User    Types: Chew  Substance Use Topics  . Alcohol use: Yes  . Drug use: Not Currently    Home Medications Prior to Admission medications   Medication Sig Start Date End Date Taking? Authorizing Provider  amLODipine-valsartan (EXFORGE) 5-160 MG tablet Take 1 tablet by mouth daily.    [provider]  aspirin 81 MG chewable tablet Chew by mouth daily.    [provider]  chlorhexidine (PERIDEX) 0.12 % solution Use as directed 15 mLs in the mouth or throat 2 (two) times daily. 03/31/20   Tasia Catchings, Amy V, PA-C  cloNIDine (CATAPRES) 0.2 MG tablet Take 0.2 mg by mouth 2 (two) times daily.    [provider]  ferrous sulfate 325 (65 FE) MG tablet Take 1 tablet (325 mg total) by mouth 2 (two) times daily with a meal. 05/22/20   Sherwood Gambler, MD  labetalol (NORMODYNE) 200 MG tablet Take 200 mg by mouth 2 (two) times daily.    [provider]    lidocaine (XYLOCAINE) 2 % solution Use as directed 15 mLs in the mouth or throat as needed for mouth pain. 03/31/20   Tasia Catchings, Amy V, PA-C  miconazole (MICOTIN) 2 % cream Apply 1 application topically 2 (two) times daily. To the corner of the mouth sparingly for 5-7 days. 03/31/20   Tasia Catchings, Amy V, PA-C    Allergies    Sulfa antibiotics  Review of Systems   Review of Systems  Constitutional: Negative for fatigue.  Respiratory: Negative for shortness of breath.   Cardiovascular: Negative for chest pain.  Neurological: Negative for dizziness, weakness and light-headedness.  All other systems reviewed and are negative.   Physical Exam Updated Vital Signs BP (!) 161/94   Pulse 69   Temp 98.5 F (36.9 C) (Oral)   Resp 16   Ht 5\' 9"  (1.753 m)   Wt (!) 104.3 kg   SpO2 100%   BMI 33.97 kg/m   Physical Exam Vitals and nursing note reviewed.  Constitutional:      Appearance: He is well-developed. He is obese.  HENT:     Head: Normocephalic and atraumatic.     Right Ear: External ear normal.     Left Ear: External ear normal.     Nose: Nose normal.  Eyes:     General:        Right eye: No discharge.        Left eye: No discharge.  Cardiovascular:     Rate and Rhythm: Normal rate and regular rhythm.     Heart sounds: Normal heart sounds.  Pulmonary:     Effort: Pulmonary effort is normal.     Breath sounds: Normal breath sounds.  Abdominal:     Palpations: Abdomen is soft.     Tenderness: There is no abdominal tenderness.  Genitourinary:    Rectum: No tenderness, anal fissure, external hemorrhoid or internal hemorrhoid. Normal anal tone.     Comments: Light brown stool on rectal exam Musculoskeletal:     Cervical back: Neck supple.  Skin:    General: Skin is warm and dry.  Neurological:     Mental Status: He is alert.  Psychiatric:        Mood and Affect: Mood is not anxious.     ED Results / Procedures / Treatments   Labs (all labs ordered are listed, but only abnormal  results are displayed) Labs Reviewed  CBC WITH DIFFERENTIAL/PLATELET - Abnormal; Notable for the following components:      Result Value   Hemoglobin 6.9 (*)    HCT 26.3 (*)    MCV 61.2 (*)    MCH 16.0 (*)    MCHC 26.2 (*)    RDW 20.2 (*)    Monocytes Absolute 1.2 (*)    All other components within normal limits  COMPREHENSIVE METABOLIC PANEL - Abnormal; Notable for the following components:   Potassium 3.1 (*)    Glucose, Bld 104 (*)    Creatinine, Ser 1.34 (*)    GFR calc non Af Amer 54 (*)    All other components within normal limits  OCCULT BLOOD X 1 CARD TO LAB, STOOL  VITAMIN B12  FOLATE  IRON AND TIBC  FERRITIN  RETICULOCYTES    EKG None  Radiology No results found.  Procedures Procedures (including critical care time)  Medications Ordered in ED Medications  potassium chloride SA (KLOR-CON) CR tablet 40 mEq (40 mEq Oral Given 05/22/20 2001)    ED Course  I have reviewed the triage vital signs and the nursing notes.  Pertinent labs & imaging results that were available during my care of the patient were reviewed by me and considered in my medical decision making (see chart for details).    MDM Rules/Calculators/A&P                          Patient has anemia with hemoglobin of 6.9.  Most recent check besides yesterday is several years ago so nothing significant to compare to.  He is not symptomatic at all.  Appears to have microcytic anemia based on low MCV.  Hemoccult is negative and no obvious melena or blood based on patient report or exam.  We do not have type and crossmatched blood at this facility.  He also has not symptomatic or unstable.  I discussed options with patient and he does not want to be admitted and transferred.  He would rather try iron and follow-up closely with his PCP.  Given he is not symptomatic I think this is reasonable though I did discuss strict return precautions. Final Clinical Impression(s) / ED Diagnoses Final diagnoses:    Anemia, unspecified type    Rx / DC Orders ED Discharge Orders  Ordered    ferrous sulfate 325 (65 FE) MG tablet  2 times daily with meals     Discontinue  Reprint     05/22/20 2043           Sherwood Gambler, MD 05/22/20 2115

## 2020-05-22 NOTE — Discharge Instructions (Addendum)
If you develop fatigue, shortness of breath, dizziness, lightheadedness, weakness, or any other new/concerning symptoms then return to the ER for evaluation.  Call your doctor on Monday for an appointment this upcoming week.  Be sure to take the iron supplementations.

## 2020-05-22 NOTE — ED Triage Notes (Signed)
Pt sent here by PMD  For " low blood"

## 2020-05-22 NOTE — ED Notes (Signed)
ED Provider at bedside. 

## 2020-05-23 LAB — FOLATE: Folate: 32.9 ng/mL (ref >5.9)

## 2020-08-18 ENCOUNTER — Encounter: Payer: Self-pay | Admitting: Gastroenterology

## 2020-08-27 ENCOUNTER — Other Ambulatory Visit: Payer: Self-pay

## 2020-08-27 ENCOUNTER — Ambulatory Visit (INDEPENDENT_AMBULATORY_CARE_PROVIDER_SITE_OTHER): Payer: Medicare PPO

## 2020-08-27 ENCOUNTER — Ambulatory Visit: Payer: Medicare PPO | Admitting: Podiatry

## 2020-08-27 DIAGNOSIS — M216X1 Other acquired deformities of right foot: Secondary | ICD-10-CM | POA: Diagnosis not present

## 2020-08-27 DIAGNOSIS — L84 Corns and callosities: Secondary | ICD-10-CM

## 2020-08-27 DIAGNOSIS — M2140 Flat foot [pes planus] (acquired), unspecified foot: Secondary | ICD-10-CM

## 2020-08-27 DIAGNOSIS — M21861 Other specified acquired deformities of right lower leg: Secondary | ICD-10-CM

## 2020-08-27 DIAGNOSIS — B351 Tinea unguium: Secondary | ICD-10-CM | POA: Diagnosis not present

## 2020-08-27 DIAGNOSIS — M79674 Pain in right toe(s): Secondary | ICD-10-CM | POA: Diagnosis not present

## 2020-08-27 DIAGNOSIS — M79675 Pain in left toe(s): Secondary | ICD-10-CM

## 2020-08-27 DIAGNOSIS — M2141 Flat foot [pes planus] (acquired), right foot: Secondary | ICD-10-CM | POA: Diagnosis not present

## 2020-08-27 DIAGNOSIS — M19071 Primary osteoarthritis, right ankle and foot: Secondary | ICD-10-CM | POA: Diagnosis not present

## 2020-08-27 DIAGNOSIS — M62461 Contracture of muscle, right lower leg: Secondary | ICD-10-CM

## 2020-08-30 NOTE — Progress Notes (Signed)
  Subjective:  Patient ID: George Watkins, male    DOB: [birth year],  MRN: [id number]  Chief Complaint  Patient presents with  . routine foot care    nail trim thickened nails and painful callou. Right ankle is tender    69 y.o. male presents with the above complaint. History confirmed with patient. Had sx 10 years ago in R ankle with another podiatrist "up on [address]" in [place]. Painful and stiff.  Objective:  Physical Exam: warm, good capillary refill, no trophic changes or ulcerative lesions, normal DP and PT pulses and normal sensory exam. Onychomycosis x10. Right foot PoP to medial ankle, sinus tarsi, with ROM of STJ which is near rigid, over the TNJ. 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 Gastrocnemius equinus of right lower extremity   4. Onychomycosis   5. Pain due to onychomycosis of toenails of both feet   6. Callus of foot      Plan:  Patient was evaluated and treated and all questions answered.   Discussed the etiology and treatment options for the condition in detail with the patient. Educated patient on the topical and oral treatment options for mycotic nails. Recommended debridement of the nails today. Sharp and mechanical debridement performed of all painful and mycotic nails today. Nails debrided in length and thickness using a nail nipper and a mechanical burr to level of comfort. Discussed treatment options including appropriate shoe gear. Follow up as needed for painful nails.  Discussed etiology and treatment options of his painful hindfoot and that he has arthritic changes and moderate to severe pes planus. We discussed non surgical and surgical treatment options in detail. Surgically we discussed double or triple arthrodesis with Achilles tendon  lengthening for realignment of his deformity and treatment of the hindfoot arthritis. He is at higher risk for complications such as wound infection or delayed healing as well as bone healing complications due to his heavy smoking. I recommend we continue non operative treatment for now with bracing. I believe he would benefit from an [place] SMO brace. Appointment for fitting will be scheduled with our pedorthist [facility] ([address]  Return in about 3 months (around 11/27/2020).

## 2020-09-08 ENCOUNTER — Other Ambulatory Visit: Payer: Medicare PPO | Admitting: Orthotics

## 2020-10-07 ENCOUNTER — Encounter: Payer: Self-pay | Admitting: Gastroenterology

## 2020-10-07 ENCOUNTER — Other Ambulatory Visit (INDEPENDENT_AMBULATORY_CARE_PROVIDER_SITE_OTHER): Payer: Medicare PPO

## 2020-10-07 ENCOUNTER — Ambulatory Visit: Payer: Medicare PPO | Admitting: Gastroenterology

## 2020-10-07 VITALS — BP 132/84 | HR 72 | Ht 69.5 in | Wt 240.0 lb

## 2020-10-07 DIAGNOSIS — R1084 Generalized abdominal pain: Secondary | ICD-10-CM | POA: Diagnosis not present

## 2020-10-07 DIAGNOSIS — D509 Iron deficiency anemia, unspecified: Secondary | ICD-10-CM | POA: Diagnosis not present

## 2020-10-07 LAB — COMPREHENSIVE METABOLIC PANEL
ALT: 27 U/L (ref 0–53)
AST: 24 U/L (ref 0–37)
Albumin: 4 g/dL (ref 3.5–5.2)
Alkaline Phosphatase: 67 U/L (ref 39–117)
BUN: 9 mg/dL (ref 6–23)
CO2: 32 mEq/L (ref 19–32)
Calcium: 9.6 mg/dL (ref 8.4–10.5)
Chloride: 101 mEq/L (ref 96–112)
Creatinine, Ser: 1.2 mg/dL (ref 0.40–1.50)
GFR: 61.95 mL/min (ref >60.00)
Glucose, Bld: 112 mg/dL — ABNORMAL HIGH (ref 70–99)
Potassium: 3.7 mEq/L (ref 3.5–5.1)
Sodium: 138 mEq/L (ref 135–145)
Total Bilirubin: 0.4 mg/dL (ref 0.2–1.2)
Total Protein: 7.8 g/dL (ref 6.0–8.3)

## 2020-10-07 LAB — CBC
HCT: 38.6 % — ABNORMAL LOW (ref 39.0–52.0)
Hemoglobin: 12.4 g/dL — ABNORMAL LOW (ref 13.0–17.0)
MCHC: 32.2 g/dL (ref 30.0–36.0)
MCV: 83.7 fl (ref 78.0–100.0)
Platelets: 225 10*3/uL (ref 150.0–400.0)
RBC: 4.61 Mil/uL (ref 4.22–5.81)
RDW: 14.2 % (ref 11.5–15.5)
WBC: 5.6 10*3/uL (ref 4.0–10.5)

## 2020-10-07 LAB — PROTIME-INR
INR: 1 ratio (ref 0.8–1.0)
Prothrombin Time: 11.5 s (ref 9.6–13.1)

## 2020-10-07 MED ORDER — NA SULFATE-K SULFATE-MG SULF 17.5-3.13-1.6 GM/177ML PO SOLN: 1.0000 | Freq: Once | ORAL | 0 refills | Status: AC

## 2020-10-07 NOTE — Progress Notes (Signed)
HPI: This is a very pleasant 69 year old man who was referred to me by Arthur Holms, NP from [facility]  He was feeling very fatigued presented to his PCP several months ago and was found to be profoundly anemic with a hemoglobin around 6, very low MCV.  They instructed him to go to the emergency room for transfusion.  He went to the emergency room but ER team did not feel he necessarily needed blood transfusion so he was instructed to take iron twice daily.  He has been taking that indeed oral iron supplement twice daily since then.  He really has not noticed much change in his bowels.  Prior to his significant anemia he was having no overt GI bleeding.  He does have chronic intermittent abdominal pains, they are generalized, no associated nausea or vomiting.  He says eating certain foods tends to bring it on including oranges and certain fruits, tomatoes as well.  The pain can last about an hour.  It does not radiate anywhere.  He was put on a proton pump with her once daily which she takes every day or 2 and it does seem to help his abdominal pains.  He has lost 5 to 10 pounds in the past 6 months.  He does not take NSAIDs routinely.  Colon cancer does not run in his family.  Upper GI cancers do not either as far as he knows   Old Data Reviewed: Blood work August 2021 showed hemoglobin 10.3, MCV 73, normal platelets, normal white count, basic metabolic profile was normal.  1 month prior his hemoglobin was 10.9 and MCV was 63, platelets 528, ferritin 176, total iron 38, base metabolic profile was normal.  Hospital work-up July 2021 Hemoccult testing of his stool was negative, his ferritin was 3, his hemoglobin was 6.9 and his MCV was 61, complete metabolic profile was normal.    Review of systems: Pertinent positive and negative review of systems were noted in the above HPI section. All other review negative.   Past Medical History:  Diagnosis Date  . Anemia    . Arthritis   . GERD (gastroesophageal reflux disease)   . Heart murmur   . Hiatal hernia   . Hypertension     Past Surgical History:  Procedure Laterality Date  . ANKLE SURGERY    . CIRCUMCISION    . HERNIA REPAIR      Current Outpatient Medications  Medication Sig Dispense Refill  . amLODipine (NORVASC) 5 MG tablet Take 5 mg by mouth daily.    . cloNIDine (CATAPRES) 0.2 MG tablet Take 0.2 mg by mouth 2 (two) times daily.    . ferrous sulfate 325 (65 FE) MG tablet Take 1 tablet (325 mg total) by mouth 2 (two) times daily with a meal. 30 tablet 0  . labetalol (NORMODYNE) 200 MG tablet Take 200 mg by mouth 2 (two) times daily.    Marland Kitchen losartan-hydrochlorothiazide (HYZAAR) 100-25 MG tablet Take 1 tablet by mouth daily.    . Omega-3 Fatty Acids (FISH OIL) 1200 MG CAPS Take 1 capsule by mouth in the morning and at bedtime.    . pantoprazole (PROTONIX) 40 MG tablet Take 40 mg by mouth daily.    Marland Kitchen triamcinolone ointment (KENALOG) 0.1 %      No current facility-administered medications for this visit.    Allergies as of 10/07/2020 - Review Complete 10/07/2020  Allergen Reaction Noted  . Sulfa antibiotics Itching 11/11/2016    History reviewed.  No pertinent family history.  Social History   Socioeconomic History  . Marital status: Married    Spouse name: Not on file  . Number of children: Not on file  . Years of education: Not on file  . Highest education level: Not on file  Occupational History  . Not on file  Tobacco Use  . Smoking status: Current Some Day Smoker    Types: Cigars  . Smokeless tobacco: Current User    Types: Chew  Substance and Sexual Activity  . Alcohol use: Yes  . Drug use: Not Currently  . Sexual activity: Not on file  Other Topics Concern  . Not on file  Social History Narrative  . Not on file   Social Determinants of Health   Financial Resource Strain: Not on file  Food Insecurity: Not on file  Transportation Needs: Not on file  Physical  Activity: Not on file  Stress: Not on file  Social Connections: Not on file  Intimate Partner Violence: Not on file     Physical Exam: BP 132/84   Pulse 72   Ht 5' 9.5" (1.765 m)   Wt 240 lb (108.9 kg)   BMI 34.93 kg/m  Constitutional: generally well-appearing Psychiatric: alert and oriented x3 Eyes: extraocular movements intact Mouth: oral pharynx moist, no lesions Neck: supple no lymphadenopathy Cardiovascular: heart regular rate and rhythm Lungs: clear to auscultation bilaterally Abdomen: soft, nontender, nondistended, no obvious ascites, no peritoneal signs, normal bowel sounds Extremities: no lower extremity edema bilaterally Skin: no lesions on visible extremities   Assessment and plan: 69 y.o. male with chronic intermittent generalized abdominal pain and severe iron deficiency anemia  Unclear etiology however I do have some concern for underlying neoplasm given his severe iron deficiency anemia and the fact that he has lost 5 or 10 pounds in the past 6 months or so.  He has not had repeat labs in 4 months.  We will do that for him today including a CBC and complete metabolic profile as well as coags.  I recommended we start his testing with endoscopic work-up including a colonoscopy and upper endoscopy.  He understands that if these tests are not helpful to explain his pains or his severe iron deficiency anemia then he will very likely need further testing.  Probably imaging at that point.  Please see the "Patient Instructions" section for addition details about the plan.   Owens Loffler, MD [facility] 10/07/2020, 11:16 AM  Cc: Arthur Holms, NP  Total time on date of encounter was 45  minutes (this included time spent preparing to see the patient reviewing records; obtaining and/or reviewing separately obtained history; performing a medically appropriate exam and/or evaluation; counseling and educating the patient and family if present; ordering  medications, tests or procedures if applicable; and documenting clinical information in the health record).

## 2020-10-07 NOTE — Patient Instructions (Signed)
If you are age 68 or older, your body mass index should be between 23-30. Your Body mass index is 34.93 kg/m. If this is out of the aforementioned range listed, please consider follow up with your Primary Care Provider.  If you are age 35 or younger, your body mass index should be between 19-25. Your Body mass index is 34.93 kg/m. If this is out of the aformentioned range listed, please consider follow up with your Primary Care Provider.    Your provider has requested that you go to the basement level for lab work before leaving today. Press "B" on the elevator. The lab is located at the first door on the left as you exit the elevator.  Due to recent changes in healthcare laws, you may see the results of your imaging and laboratory studies on MyChart before your provider has had a chance to review them.  We understand that in some cases there may be results that are confusing or concerning to you. Not all laboratory results come back in the same time frame and the provider may be waiting for multiple results in order to interpret others.  Please give [place] 48 hours in order for your provider to thoroughly review all the results before contacting the office for clarification of your results.    You have been scheduled for an endoscopy and colonoscopy. Please follow the written instructions given to you at your visit today. Please pick up your prep supplies at the pharmacy within the next 1-3 days. If you use inhalers (even only as needed), please bring them with you on the day of your procedure.   It was great seeing you today!  Thank you for entrusting me with your care and choosing [facility].  Dr. Ardis Hughs

## 2020-10-09 ENCOUNTER — Telehealth: Payer: Self-pay | Admitting: Gastroenterology

## 2020-10-09 NOTE — Telephone Encounter (Signed)
See results note. 

## 2020-11-20 ENCOUNTER — Telehealth: Payer: Self-pay | Admitting: Gastroenterology

## 2020-11-20 NOTE — Telephone Encounter (Signed)
Hi Dr. Ardis Hughs,  This patient just called requesting to cancel his procedure scheduled for 11/23/20 due to his wife being at the hospital at this time.

## 2020-11-23 ENCOUNTER — Encounter: Payer: Medicare PPO | Admitting: Gastroenterology

## 2020-12-03 ENCOUNTER — Ambulatory Visit: Payer: Medicare PPO | Admitting: Podiatry

## 2020-12-28 ENCOUNTER — Ambulatory Visit
Admission: EM | Admit: 2020-12-28 | Discharge: 2020-12-28 | Disposition: A | Discharge status: Home / Self Care | Payer: Medicare PPO | Attending: Family Medicine | Admitting: Family Medicine

## 2020-12-28 ENCOUNTER — Other Ambulatory Visit: Payer: Self-pay

## 2020-12-28 DIAGNOSIS — M5489 Other dorsalgia: Secondary | ICD-10-CM

## 2020-12-28 MED ORDER — CYCLOBENZAPRINE HCL 5 MG PO TABS: 5.0000 mg | ORAL_TABLET | Freq: Every day | ORAL | 0 refills | Status: DC

## 2020-12-28 MED ORDER — PREDNISONE 20 MG PO TABS
20.0000 mg | ORAL_TABLET | Freq: Every day | ORAL | 0 refills | Status: AC
Start: 2020-12-28 — End: 2021-01-02

## 2020-12-28 NOTE — ED Triage Notes (Signed)
Pt presents with complains of right flank pain x 2 weeks. Denies any urinary symptoms or injury. Reports hx of same, unsure what it was at that time. Sometimes the pain is worse with movement or positioning.

## 2020-12-28 NOTE — ED Provider Notes (Signed)
[facility]    CSN: [id number] Arrival date & time: 12/28/20  1145      History   Chief Complaint Chief Complaint  Patient presents with  . Flank Pain    HPI George Watkins is a 70 y.o. male.   HPI  Patient reports right lateral thoracic/flank pain ongoing over 2 weeks.  He is not having any dysuria symptoms.  Pain is worse with positioning and movement. Patient was seen here at urgent care for the same pattern of pain back in 2020 and was treated for acute paraspinal inflammation with prednisone.  Patient reports symptoms resolved with treatment.  He has not had any additional follow-up since that time.  He has primary care provider over at [facility].  He is interested in a referral to a spine specialist and will discuss with primary care provider.  He denies any recent upper respiratory illness, any chest pain, or any shortness of breath.  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  Past Medical History:  Diagnosis Date  . Anemia   . Arthritis   . GERD (gastroesophageal reflux disease)   . Heart murmur   . Hiatal hernia   . Hypertension     There are no problems to display for this patient.   Past Surgical History:  Procedure Laterality Date  . ANKLE SURGERY    . CIRCUMCISION    . HERNIA REPAIR         Home Medications    Prior to Admission medications   Medication Sig Start Date End Date Taking? Authorizing Provider  amLODipine (NORVASC) 5 MG tablet Take 5 mg by mouth daily. 08/06/20   [provider]  cloNIDine (CATAPRES) 0.2 MG tablet Take 0.2 mg by mouth 2 (two) times daily.    [provider]  ferrous sulfate 325 (65 FE) MG tablet Take 1 tablet (325 mg total) by mouth 2 (two) times daily with a meal. 05/22/20   Sherwood Gambler, MD  labetalol (NORMODYNE) 200 MG tablet Take 200 mg by mouth 2 (two) times daily.    [provider]  losartan-hydrochlorothiazide (HYZAAR) 100-25  MG tablet Take 1 tablet by mouth daily.    [provider]  Omega-3 Fatty Acids (FISH OIL) 1200 MG CAPS Take 1 capsule by mouth in the morning and at bedtime.    [provider]  pantoprazole (PROTONIX) 40 MG tablet Take 40 mg by mouth daily. 06/11/20   [provider]  triamcinolone ointment (KENALOG) 0.1 %  06/11/20   [provider]    Family History Family History  Problem Relation Age of Onset  . Healthy Mother   . Healthy Father     Social History Social History   Tobacco Use  . Smoking status: Current Some Day Smoker    Types: Cigars  . Smokeless tobacco: Current User    Types: Chew  Substance Use Topics  . Alcohol use: Yes  . Drug use: Not Currently     Allergies   Sulfa antibiotics   Review of Systems Review of Systems Pertinent negatives listed in HPI   Physical Exam Triage Vital Signs ED Triage Vitals  Enc Vitals Group     BP 12/28/20 1254 126/81     Pulse Rate 12/28/20 1254 (!) 54     Resp 12/28/20 1254 19     Temp 12/28/20 1254 98 F (36.7 C)     Temp src --  SpO2 12/28/20 1254 100 %     Weight --      Height --      Head Circumference --      Peak Flow --      Pain Score 12/28/20 1253 8     Pain Loc --      Pain Edu? --      Excl. in [place]? --    No data found.  Updated Vital Signs BP 126/81   Pulse (!) 54   Temp 98 F (36.7 C)   Resp 19   SpO2 100%   Visual Acuity Right Eye Distance:   Left Eye Distance:   Bilateral Distance:    Right Eye Near:   Left Eye Near:    Bilateral Near:     Physical Exam Constitutional:      Appearance: Normal appearance.  Cardiovascular:     Rate and Rhythm: Regular rhythm. Bradycardia present.  Musculoskeletal:     Cervical back: Normal range of motion.       Back:  Skin:    Capillary Refill: Capillary refill takes less than 2 seconds.  Neurological:     General: No focal deficit present.     Mental Status: He is alert and oriented to person, place,  and time.     Sensory: Sensation is intact.     Motor: Motor function is intact.     Coordination: Coordination is intact.     Gait: Gait is intact.  Psychiatric:        Mood and Affect: Mood normal.        Behavior: Behavior normal.        Thought Content: Thought content normal.        Judgment: Judgment normal.      UC Treatments / Results  Labs (all labs ordered are listed, but only abnormal results are displayed) Labs Reviewed - No data to display  EKG   Radiology No results found.  Procedures Procedures (including critical care time)  Medications Ordered in UC Medications - No data to display  Initial Impression / Assessment and Plan / UC Course  I have reviewed the triage vital signs and the nursing notes.  Pertinent labs & imaging results that were available during my care of the patient were reviewed by me and considered in my medical decision making (see chart for details).   Treating for acute right paraspinal strain.  Prednisone 20 mg once daily for 5 days.  Cyclobenzaprine as needed at bedtime.  Discussed with patient to contact primary care provider for referral to a spinal specialist.  Avoid any heavy lifting until symptoms completely resolved.  ER precautions given. Final Clinical Impressions(s) / UC Diagnoses   Final diagnoses:  Pain in right paraspinal region     Discharge Instructions     Please follow-up with primary care provider for referral to spine specialist for further evaluation of left paraspinal pain.     ED Prescriptions    Medication Sig Dispense Auth. Provider   predniSONE (DELTASONE) 20 MG tablet Take 1 tablet (20 mg total) by mouth daily with breakfast for 5 days. 5 tablet Scot Jun, FNP   cyclobenzaprine (FLEXERIL) 5 MG tablet Take 1 tablet (5 mg total) by mouth at bedtime. 20 tablet Scot Jun, FNP     PDMP not reviewed this encounter.   Scot Jun, FNP 12/28/20 1356

## 2020-12-28 NOTE — Discharge Instructions (Addendum)
Please follow-up with primary care provider for referral to spine specialist for further evaluation of left paraspinal pain.

## 2020-12-29 ENCOUNTER — Ambulatory Visit
Admission: EM | Admit: 2020-12-29 | Discharge: 2020-12-29 | Disposition: A | Discharge status: Home / Self Care | Payer: Medicare PPO | Attending: Family Medicine | Admitting: Family Medicine

## 2020-12-29 DIAGNOSIS — M546 Pain in thoracic spine: Secondary | ICD-10-CM | POA: Diagnosis not present

## 2020-12-29 DIAGNOSIS — M62838 Other muscle spasm: Secondary | ICD-10-CM

## 2020-12-29 MED ORDER — KETOROLAC TROMETHAMINE 30 MG/ML IJ SOLN
30.0000 mg | Freq: Once | INTRAMUSCULAR | Status: AC
  Administered 2020-12-29: 30 mg via INTRAMUSCULAR

## 2020-12-29 MED ORDER — TRAMADOL HCL 50 MG PO TABS: 50.0000 mg | ORAL_TABLET | Freq: Four times a day (QID) | ORAL | 0 refills | Status: DC | PRN

## 2020-12-29 NOTE — ED Triage Notes (Signed)
Pt was seen yesterday for back pain and returns because he was unable to sleep because of pain, has taken t doses of medication

## 2020-12-29 NOTE — Discharge Instructions (Signed)
You have received an injection for pain in the office today  I have sent in Tramadol 50mg  for you to take one tablet every 6 hours as needed for moderate to severe pain  Continue steroid course and muscle relaxers as needed  Follow up with this office or with primary care if symptoms are persisting.  Follow up in the ER for high fever, trouble swallowing, trouble breathing, other concerning symptoms.

## 2020-12-31 NOTE — ED Provider Notes (Signed)
[name]    CSN: [id number] Arrival date & time: 12/29/20  1117      History   Chief Complaint Chief Complaint  Patient presents with  . Back Pain    HPI George Watkins is a 70 y.o. male.   Reports right sided back and rib pain for the last week. Was seen in this office yesterday and was treated with muscle relaxers and steroids. States that he was in so much pain last night that he couldn't sleep. Denies new pain, new symptoms. Denies pain radiation, changes in strength or sensation.   ROS per HPI  The history is provided by the patient.  Back Pain   Past Medical History:  Diagnosis Date  . Anemia   . Arthritis   . GERD (gastroesophageal reflux disease)   . Heart murmur   . Hiatal hernia   . Hypertension     There are no problems to display for this patient.   Past Surgical History:  Procedure Laterality Date  . ANKLE SURGERY    . CIRCUMCISION    . HERNIA REPAIR         Home Medications    Prior to Admission medications   Medication Sig Start Date End Date Taking? Authorizing Provider  traMADol (ULTRAM) 50 MG tablet Take 1 tablet (50 mg total) by mouth every 6 (six) hours as needed. 12/29/20  Yes Faustino Congress, NP  amLODipine (NORVASC) 5 MG tablet Take 5 mg by mouth daily. 08/06/20   [provider]  cloNIDine (CATAPRES) 0.2 MG tablet Take 0.2 mg by mouth 2 (two) times daily.    [provider]  cyclobenzaprine (FLEXERIL) 5 MG tablet Take 1 tablet (5 mg total) by mouth at bedtime. 12/28/20   Scot Jun, FNP  ferrous sulfate 325 (65 FE) MG tablet Take 1 tablet (325 mg total) by mouth 2 (two) times daily with a meal. 05/22/20   Sherwood Gambler, MD  labetalol (NORMODYNE) 200 MG tablet Take 200 mg by mouth 2 (two) times daily.    [provider]  losartan-hydrochlorothiazide (HYZAAR) 100-25 MG tablet Take 1 tablet by mouth daily.    [provider]  Omega-3 Fatty Acids (FISH OIL) 1200 MG CAPS Take 1  capsule by mouth in the morning and at bedtime.    [provider]  pantoprazole (PROTONIX) 40 MG tablet Take 40 mg by mouth daily. 06/11/20   [provider]  predniSONE (DELTASONE) 20 MG tablet Take 1 tablet (20 mg total) by mouth daily with breakfast for 5 days. 12/28/20 01/02/21  Scot Jun, FNP  triamcinolone ointment (KENALOG) 0.1 %  06/11/20   [provider]    Family History Family History  Problem Relation Age of Onset  . Healthy Mother   . Healthy Father     Social History Social History   Tobacco Use  . Smoking status: Current Some Day Smoker    Types: Cigars  . Smokeless tobacco: Current User    Types: Chew  Substance Use Topics  . Alcohol use: Yes  . Drug use: Not Currently     Allergies   Sulfa antibiotics   Review of Systems Review of Systems  Musculoskeletal: Positive for back pain.     Physical Exam Triage Vital Signs ED Triage Vitals  Enc Vitals Group     BP 12/29/20 1138 (!) 152/94     Pulse Rate 12/29/20 1138 75     Resp 12/29/20 1138 20  Temp 12/29/20 1138 98.7 F (37.1 C)     Temp Source 12/29/20 1138 Oral     SpO2 12/29/20 1138 97 %     Weight --      Height --      Head Circumference --      Peak Flow --      Pain Score 12/29/20 1155 7     Pain Loc --      Pain Edu? --      Excl. in [place]? --    No data found.  Updated Vital Signs BP (!) 152/94 (BP Location: Left Arm)   Pulse 75   Temp 98.7 F (37.1 C) (Oral)   Resp 20   SpO2 97%   Visual Acuity Right Eye Distance:   Left Eye Distance:   Bilateral Distance:    Right Eye Near:   Left Eye Near:    Bilateral Near:     Physical Exam Vitals and nursing note reviewed.  Constitutional:      General: He is not in acute distress.    Appearance: Normal appearance. He is well-developed. He is not ill-appearing.  HENT:     Head: Normocephalic and atraumatic.  Eyes:     Conjunctiva/sclera: Conjunctivae normal.  Cardiovascular:     Rate and  Rhythm: Normal rate and regular rhythm.     Heart sounds: No murmur heard.   Pulmonary:     Effort: Pulmonary effort is normal. No respiratory distress.     Breath sounds: Normal breath sounds.  Abdominal:     Palpations: Abdomen is soft.     Tenderness: There is no abdominal tenderness.  Musculoskeletal:     Cervical back: Normal range of motion and neck supple. Tenderness present.       Back:  Skin:    General: Skin is warm and dry.     Capillary Refill: Capillary refill takes less than 2 seconds.  Neurological:     General: No focal deficit present.     Mental Status: He is alert and oriented to person, place, and time.  Psychiatric:        Mood and Affect: Mood normal.        Behavior: Behavior normal.        Thought Content: Thought content normal.      UC Treatments / Results  Labs (all labs ordered are listed, but only abnormal results are displayed) Labs Reviewed - No data to display  EKG   Radiology No results found.  Procedures Procedures (including critical care time)  Medications Ordered in UC Medications  ketorolac (TORADOL) 30 MG/ML injection 30 mg (30 mg Intramuscular Given 12/29/20 1213)    Initial Impression / Assessment and Plan / UC Course  I have reviewed the triage vital signs and the nursing notes.  Pertinent labs & imaging results that were available during my care of the patient were reviewed by me and considered in my medical decision making (see chart for details).    Muscle spasm Acute right sided thoracic back pain  Toradol 30mg  IM in office today Prescribed tramadol for moderate to severe pain May continue to take tylenol Continue steroid course Continue muscle relaxers as ordered Follow up with this office or with primary care if symptoms are persisting.  Follow up in the ER for high fever, trouble swallowing, trouble breathing, other concerning symptoms.   Final Clinical Impressions(s) / UC Diagnoses   Final diagnoses:   Acute right-sided thoracic back pain  Muscle spasm  Discharge Instructions     You have received an injection for pain in the office today  I have sent in Tramadol 50mg  for you to take one tablet every 6 hours as needed for moderate to severe pain  Continue steroid course and muscle relaxers as needed  Follow up with this office or with primary care if symptoms are persisting.  Follow up in the ER for high fever, trouble swallowing, trouble breathing, other concerning symptoms.     ED Prescriptions    Medication Sig Dispense Auth. Provider   traMADol (ULTRAM) 50 MG tablet Take 1 tablet (50 mg total) by mouth every 6 (six) hours as needed. 15 tablet Faustino Congress, NP     I have reviewed the PDMP during this encounter.   Faustino Congress, NP 12/31/20 1202

## 2021-01-11 ENCOUNTER — Ambulatory Visit: Payer: Medicare PPO | Attending: Critical Care Medicine

## 2021-01-11 DIAGNOSIS — Z20822 Contact with and (suspected) exposure to covid-19: Secondary | ICD-10-CM

## 2021-01-12 LAB — NOVEL CORONAVIRUS, NAA: SARS-CoV-2, NAA: NOT DETECTED

## 2021-01-12 LAB — SARS-COV-2, NAA 2 DAY TAT

## 2021-08-25 ENCOUNTER — Other Ambulatory Visit (INDEPENDENT_AMBULATORY_CARE_PROVIDER_SITE_OTHER): Payer: Medicare PPO

## 2021-08-25 ENCOUNTER — Encounter: Payer: Self-pay | Admitting: Gastroenterology

## 2021-08-25 ENCOUNTER — Ambulatory Visit (INDEPENDENT_AMBULATORY_CARE_PROVIDER_SITE_OTHER): Payer: Medicare PPO | Admitting: Gastroenterology

## 2021-08-25 DIAGNOSIS — R109 Unspecified abdominal pain: Secondary | ICD-10-CM

## 2021-08-25 DIAGNOSIS — D509 Iron deficiency anemia, unspecified: Secondary | ICD-10-CM

## 2021-08-25 LAB — CBC
HCT: 38.4 % — ABNORMAL LOW (ref 39.0–52.0)
Hemoglobin: 12.1 g/dL — ABNORMAL LOW (ref 13.0–17.0)
MCHC: 31.5 g/dL (ref 30.0–36.0)
MCV: 85.2 fl (ref 78.0–100.0)
Platelets: 240 10*3/uL (ref 150.0–400.0)
RBC: 4.51 Mil/uL (ref 4.22–5.81)
RDW: 14.8 % (ref 11.5–15.5)
WBC: 7.3 10*3/uL (ref 4.0–10.5)

## 2021-08-25 MED ORDER — NA SULFATE-K SULFATE-MG SULF 17.5-3.13-1.6 GM/177ML PO SOLN
1.0000 | ORAL | 0 refills | Status: DC
Start: 2021-08-25 — End: 2021-10-29

## 2021-08-25 NOTE — Patient Instructions (Signed)
If you are age 69 or older, your body mass index should be between 23-30. Your Body mass index is 32.82 kg/m. If this is out of the aforementioned range listed, please consider follow up with your Primary Care Provider. ________________________________________________________  The [facility] providers would like to encourage you to use [facility] to communicate with providers for non-urgent requests or questions.  Due to long hold times on the telephone, sending your provider a message by [facility] may be a faster and more efficient way to get a response.  Please allow 48 business hours for a response.  Please remember that this is for non-urgent requests.  _______________________________________________________  Your provider has requested that you go to the basement level for lab work before leaving today. Press "B" on the elevator. The lab is located at the first door on the left as you exit the elevator.  You have been scheduled for an endoscopy and colonoscopy. Please follow the written instructions given to you at your visit today. Please pick up your prep supplies at the pharmacy within the next 1-3 days. If you use inhalers (even only as needed), please bring them with you on the day of your procedure.  Due to recent changes in healthcare laws, you may see the results of your imaging and laboratory studies on MyChart before your provider has had a chance to review them.  We understand that in some cases there may be results that are confusing or concerning to you. Not all laboratory results come back in the same time frame and the provider may be waiting for multiple results in order to interpret others.  Please give [place] 48 hours in order for your provider to thoroughly review all the results before contacting the office for clarification of your results.   Thank you for entrusting me with your care and choosing [facility].  Dr Ardis Hughs

## 2021-08-25 NOTE — Progress Notes (Signed)
HPI: This is a very pleasant 70 year old man   I last saw him here in the office December 2021 for generalized abdominal pain and severe iron deficiency anemia.  I recommended that we start his testing with repeat blood work as well as endoscopic testing with a colonoscopy and upper endoscopy.  He canceled the procedures and we have not heard from him since then.  Sounds like his wife became ill with gallbladder problems that were eventually proven to be metastatic gallbladder cancer.  She is on chemotherapy now.  Blood work December 2021 showed hemoglobin 12.4, MCV was normal, coags were normal, complete metabolic profile was normal.  Today he tells me he has continued to take iron, 2 pills daily.  He has generalized abdominal pain.  He thinks he has lost maybe 20 pounds in the past year or so.  The abdominal pain is just a generalized ache.  It can be worse postprandial sometimes.  Otherwise he feels fine no dysphagia, he moves his bowels regularly without overt bleeding.  Colon cancer does not run in his family, he does not take NSAIDs regularly  ROS: complete GI ROS as described in HPI, all other review negative.  Constitutional:  No unintentional weight loss   Past Medical History:  Diagnosis Date   Anemia    Arthritis    GERD (gastroesophageal reflux disease)    Heart murmur    Hiatal hernia    Hypertension     Past Surgical History:  Procedure Laterality Date   ANKLE SURGERY     CIRCUMCISION     HERNIA REPAIR      Current Outpatient Medications  Medication Sig Dispense Refill   amLODipine (NORVASC) 5 MG tablet Take 5 mg by mouth daily.     cloNIDine (CATAPRES) 0.2 MG tablet Take 0.2 mg by mouth 2 (two) times daily.     ferrous sulfate 325 (65 FE) MG tablet Take 1 tablet (325 mg total) by mouth 2 (two) times daily with a meal. 30 tablet 0   labetalol (NORMODYNE) 200 MG tablet Take 200 mg by mouth 2 (two) times daily.     losartan-hydrochlorothiazide (HYZAAR) 100-25  MG tablet Take 1 tablet by mouth daily.     Omega-3 Fatty Acids (FISH OIL) 1200 MG CAPS Take 1 capsule by mouth in the morning and at bedtime.     pantoprazole (PROTONIX) 40 MG tablet Take 40 mg by mouth daily.     triamcinolone ointment (KENALOG) 0.1 %      No current facility-administered medications for this visit.    Allergies as of 08/25/2021 - Review Complete 08/25/2021  Allergen Reaction Noted   Sulfa antibiotics Itching 11/11/2016    Family History  Problem Relation Age of Onset   Healthy Mother    Healthy Father     Social History   Socioeconomic History   Marital status: Married    Spouse name: Not on file   Number of children: Not on file   Years of education: Not on file   Highest education level: Not on file  Occupational History   Not on file  Tobacco Use   Smoking status: Some Days    Types: Cigars   Smokeless tobacco: Current    Types: Chew  Substance and Sexual Activity   Alcohol use: Yes   Drug use: Not Currently   Sexual activity: Not on file  Other Topics Concern   Not on file  Social History Narrative   Not on file  Social Determinants of Health   Financial Resource Strain: Not on file  Food Insecurity: Not on file  Transportation Needs: Not on file  Physical Activity: Not on file  Stress: Not on file  Social Connections: Not on file  Intimate Partner Violence: Not on file     Physical Exam: BP 118/82   Pulse 70   Ht 5' 9.5" (1.765 m)   Wt 225 lb 8 oz (102.3 kg)   SpO2 98%   BMI 32.82 kg/m  Constitutional: generally well-appearing Psychiatric: alert and oriented x3 Abdomen: soft, nontender, nondistended, no obvious ascites, no peritoneal signs, normal bowel sounds No peripheral edema noted in lower extremities  Assessment and plan: 70 y.o. male with 70 year old man with iron deficiency anemia, generalized abdominal pain, previous weight loss  I again recommended that we proceed with further testing with colonoscopy and upper  endoscopy at his soonest convenience.  He will also get a repeat set of labs today including a CBC.  I see no reason for any further blood tests or imaging studies prior to then.  Please see the "Patient Instructions" section for addition details about the plan.  George Loffler, MD [facility] 08/25/2021, 11:04 AM   Total time on date of encounter was 35 minutes (this included time spent preparing to see the patient reviewing records; obtaining and/or reviewing separately obtained history; performing a medically appropriate exam and/or evaluation; counseling and educating the patient and family if present; ordering medications, tests or procedures if applicable; and documenting clinical information in the health record).

## 2021-10-07 ENCOUNTER — Encounter: Payer: Self-pay | Admitting: Gastroenterology

## 2021-10-13 ENCOUNTER — Other Ambulatory Visit: Payer: Self-pay | Admitting: *Deleted

## 2021-10-13 ENCOUNTER — Ambulatory Visit (AMBULATORY_SURGERY_CENTER): Payer: Medicare PPO | Admitting: Gastroenterology

## 2021-10-13 ENCOUNTER — Encounter: Payer: Self-pay | Admitting: Gastroenterology

## 2021-10-13 ENCOUNTER — Other Ambulatory Visit (INDEPENDENT_AMBULATORY_CARE_PROVIDER_SITE_OTHER): Payer: Medicare PPO

## 2021-10-13 ENCOUNTER — Other Ambulatory Visit: Payer: Self-pay

## 2021-10-13 VITALS — BP 139/75 | HR 56 | Temp 97.1°F | Resp 22 | Ht 69.0 in | Wt 225.0 lb

## 2021-10-13 DIAGNOSIS — C183 Malignant neoplasm of hepatic flexure: Secondary | ICD-10-CM | POA: Diagnosis not present

## 2021-10-13 DIAGNOSIS — D509 Iron deficiency anemia, unspecified: Secondary | ICD-10-CM

## 2021-10-13 DIAGNOSIS — R109 Unspecified abdominal pain: Secondary | ICD-10-CM

## 2021-10-13 DIAGNOSIS — D123 Benign neoplasm of transverse colon: Secondary | ICD-10-CM

## 2021-10-13 LAB — COMPREHENSIVE METABOLIC PANEL
ALT: 31 U/L (ref 0–53)
AST: 24 U/L (ref 0–37)
Albumin: 3.5 g/dL (ref 3.5–5.2)
Alkaline Phosphatase: 50 U/L (ref 39–117)
BUN: 14 mg/dL (ref 6–23)
CO2: 33 mEq/L — ABNORMAL HIGH (ref 19–32)
Calcium: 8.9 mg/dL (ref 8.4–10.5)
Chloride: 99 mEq/L (ref 96–112)
Creatinine, Ser: 1.3 mg/dL (ref 0.40–1.50)
GFR: 55.88 mL/min — ABNORMAL LOW (ref >60.00)
Glucose, Bld: 95 mg/dL (ref 70–99)
Potassium: 3.4 mEq/L — ABNORMAL LOW (ref 3.5–5.1)
Sodium: 138 mEq/L (ref 135–145)
Total Bilirubin: 1.2 mg/dL (ref 0.2–1.2)
Total Protein: 7.1 g/dL (ref 6.0–8.3)

## 2021-10-13 LAB — CBC WITH DIFFERENTIAL/PLATELET
Basophils Absolute: 0 10*3/uL (ref 0.0–0.1)
Basophils Relative: 0.7 % (ref 0.0–3.0)
Eosinophils Absolute: 0.1 10*3/uL (ref 0.0–0.7)
Eosinophils Relative: 1.1 % (ref 0.0–5.0)
HCT: 35.8 % — ABNORMAL LOW (ref 39.0–52.0)
Hemoglobin: 11.2 g/dL — ABNORMAL LOW (ref 13.0–17.0)
Lymphocytes Relative: 22 % (ref 12.0–46.0)
Lymphs Abs: 1.4 10*3/uL (ref 0.7–4.0)
MCHC: 31.4 g/dL (ref 30.0–36.0)
MCV: 83.6 fl (ref 78.0–100.0)
Monocytes Absolute: 0.6 10*3/uL (ref 0.1–1.0)
Monocytes Relative: 10.3 % (ref 3.0–12.0)
Neutro Abs: 4.1 10*3/uL (ref 1.4–7.7)
Neutrophils Relative %: 65.9 % (ref 43.0–77.0)
Platelets: 216 10*3/uL (ref 150.0–400.0)
RBC: 4.28 Mil/uL (ref 4.22–5.81)
RDW: 13.9 % (ref 11.5–15.5)
WBC: 6.3 10*3/uL (ref 4.0–10.5)

## 2021-10-13 MED ORDER — SODIUM CHLORIDE 0.9 % IV SOLN: 500.0000 mL | Freq: Once | INTRAVENOUS | Status: DC

## 2021-10-13 NOTE — Progress Notes (Signed)
Called to room to assist during endoscopic procedure.  Patient ID and intended procedure confirmed with present staff. Received instructions for my participation in the procedure from the performing physician.  

## 2021-10-13 NOTE — Patient Instructions (Signed)
Please read handouts provided. Continue present medications. Await pathology results.     YOU HAD AN ENDOSCOPIC PROCEDURE TODAY AT THE [facility]:   Refer to the procedure report that was given to you for any specific questions about what was found during the examination.  If the procedure report does not answer your questions, please call your gastroenterologist to clarify.  If you requested that your care partner not be given the details of your procedure findings, then the procedure report has been included in a sealed envelope for you to review at your convenience later.  YOU SHOULD EXPECT: Some feelings of bloating in the abdomen. Passage of more gas than usual.  Walking can help get rid of the air that was put into your GI tract during the procedure and reduce the bloating. If you had a lower endoscopy (such as a colonoscopy or flexible sigmoidoscopy) you may notice spotting of blood in your stool or on the toilet paper. If you underwent a bowel prep for your procedure, you may not have a normal bowel movement for a few days.  Please Note:  You might notice some irritation and congestion in your nose or some drainage.  This is from the oxygen used during your procedure.  There is no need for concern and it should clear up in a day or so.  SYMPTOMS TO REPORT IMMEDIATELY:   Following lower endoscopy (colonoscopy or flexible sigmoidoscopy):  Excessive amounts of blood in the stool  Significant tenderness or worsening of abdominal pains  Swelling of the abdomen that is new, acute  Fever of 100F or higher   Following upper endoscopy (EGD)  Vomiting of blood or coffee ground material  New chest pain or pain under the shoulder blades  Painful or persistently difficult swallowing  New shortness of breath  Fever of 100F or higher  Black, tarry-looking stools  For urgent or emergent issues, a gastroenterologist can be reached at any hour by calling [phone]. Do not  use MyChart messaging for urgent concerns.    DIET:  We do recommend a small meal at first, but then you may proceed to your regular diet.  Drink plenty of fluids but you should avoid alcoholic beverages for 24 hours.  ACTIVITY:  You should plan to take it easy for the rest of today and you should NOT DRIVE or use heavy machinery until tomorrow (because of the sedation medicines used during the test).    FOLLOW UP: Our staff will call the number listed on your records 48-72 hours following your procedure to check on you and address any questions or concerns that you may have regarding the information given to you following your procedure. If we do not reach you, we will leave a message.  We will attempt to reach you two times.  During this call, we will ask if you have developed any symptoms of COVID 19. If you develop any symptoms (ie: fever, flu-like symptoms, shortness of breath, cough etc.) before then, please call [phone].  If you test positive for Covid 19 in the 2 weeks post procedure, please call and report this information to us.    If any biopsies were taken you will be contacted by phone or by letter within the next 1-3 weeks.  Please call us at [phone] if you have not heard about the biopsies in 3 weeks.    SIGNATURES/CONFIDENTIALITY: You and/or your care partner have signed paperwork which will be entered into your electronic medical   record.  These signatures attest to the fact that that the information above on your After Visit Summary has been reviewed and is understood.  Full responsibility of the confidentiality of this discharge information lies with you and/or your care-partner. 

## 2021-10-13 NOTE — Op Note (Signed)
[facility] Patient Name: George Watkins Procedure Date: 10/13/2021 1:50 PM MRN: [id number] Endoscopist: Milus Banister , MD Age: 70 Referring MD:  Date of Birth: 11/22/1950 Gender: Male Account #: [id number] Procedure:                Upper GI endoscopy Indications:              Iron deficiency anemia, abdominal pain Medicines:                Monitored Anesthesia Care Procedure:                Pre-Anesthesia Assessment:                           - Prior to the procedure, a History and Physical                            was performed, and patient medications and                            allergies were reviewed. The patient's tolerance of                            previous anesthesia was also reviewed. The risks                            and benefits of the procedure and the sedation                            options and risks were discussed with the patient.                            All questions were answered, and informed consent                            was obtained. Prior Anticoagulants: The patient has                            taken no previous anticoagulant or antiplatelet                            agents. ASA Grade Assessment: II - A patient with                            mild systemic disease. After reviewing the risks                            and benefits, the patient was deemed in                            satisfactory condition to undergo the procedure.                           After obtaining informed consent, the endoscope was  passed under direct vision. Throughout the                            procedure, the patient's blood pressure, pulse, and                            oxygen saturations were monitored continuously. The                            Endoscope was introduced through the mouth, and                            advanced to the second part of duodenum. The upper                            GI endoscopy was  accomplished without difficulty.                            The patient tolerated the procedure well. Scope In: Scope Out: Findings:                 The esophagus was normal.                           The stomach was normal.                           The examined duodenum was normal. Complications:            No immediate complications. Estimated blood loss:                            None. Estimated Blood Loss:     Estimated blood loss: none. Impression:               - Normal UGI tract. Recommendation:           - Patient has a contact number available for                            emergencies. The signs and symptoms of potential                            delayed complications were discussed with the                            patient. Return to normal activities tomorrow.                            Written discharge instructions were provided to the                            patient.                           - Resume previous diet.                           -  Continue present medications.                           - Staging workup for the colon cancer diagnosed                            just prior to this exam: CT scan chest/abd/pelvis                            with PO and IV contrast. CBC, CMET, CEA.                           - Referral to [facility] for newly diagnosed colon                            cancer.                           - May need medical oncology referral as well, will                            depend on staging workup. Milus Banister, MD 10/13/2021 2:39:37 PM This report has been signed electronically.

## 2021-10-13 NOTE — Progress Notes (Signed)
Report to PACU, RN, vss, BBS= Clear.  

## 2021-10-13 NOTE — Op Note (Signed)
[name] Procedure Date: 10/13/2021 1:50 PM MRN: [id number] Endoscopist: Milus Banister , MD Age: 70 Referring MD:  Date of Birth: 1951/02/25 Gender: Male Account #: [id number] Procedure:                Colonoscopy Indications:              Iron deficiency anemia, abdominal pain Medicines:                Monitored Anesthesia Care Procedure:                Pre-Anesthesia Assessment:                           - Prior to the procedure, a History and Physical                            was performed, and patient medications and                            allergies were reviewed. The patient's tolerance of                            previous anesthesia was also reviewed. The risks                            and benefits of the procedure and the sedation                            options and risks were discussed with the patient.                            All questions were answered, and informed consent                            was obtained. Prior Anticoagulants: The patient has                            taken no previous anticoagulant or antiplatelet                            agents. ASA Grade Assessment: II - A patient with                            mild systemic disease. After reviewing the risks                            and benefits, the patient was deemed in                            satisfactory condition to undergo the procedure.                           After obtaining informed consent, the colonoscope  was passed under direct vision. Throughout the                            procedure, the patient's blood pressure, pulse, and                            oxygen saturations were monitored continuously. The                            Olympus CF-HQ190L ([id number]) Colonoscope was                            introduced through the anus and advanced to the the                            cecum, identified by  appendiceal orifice and                            ileocecal valve. The colonoscopy was performed                            without difficulty. The patient tolerated the                            procedure well. The quality of the bowel                            preparation was good. The ileocecal valve,                            appendiceal orifice, and rectum were photographed. 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 The mass was circumferential and measured                            4cm in length. Biopsies were taken with a cold                            forceps for histology and then the mucosa just                            distal to the mass was tattooed with an injection                            of Spot (carbon black).  jar 1                           Two pedunculated polyps were found in the mid                            transverse colon. The polyps were 8 to 11 mm in                            size. These polyps were removed with a hot snare.                            Resection and retrieval were complete. jar 2                           Five pedunculated and sessile polyps were found in                            the distal transverse colon. The polyps were 4 to                            14 mm in size. Two were removed with hot snare,                            three were removed with cold snare. Resection and                            retrieval were complete. jar 3                           A 20 mm polyp was found in the distal transverse                            colon. The polyp was pedunculated. The polyp was                            removed with a hot snare.  Resection and retrieval                            were complete. jar 4                           The exam was otherwise without abnormality on                            direct and retroflexion views. Complications:            No immediate complications. Estimated blood loss:                            None. Estimated Blood Loss:     Estimated blood loss: none. 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 Biopsied. Tattooed.                           - Two 8 to 11 mm polyps in the mid transverse                            colon, removed with a hot snare. Resected and                            retrieved.                           - Five 4 to 14 mm polyps in the distal transverse                            colon, some removed with hot snare and some with                            cold snare. Resected and retrieved.                           - One 20 mm polyp in the distal transverse colon,                            removed with a hot snare. Resected and retrieved.                           - The examination was otherwise normal on direct                            and retroflexion views. Recommendation:           - EGD now.                           - Await pathology results, sent [facility] Milus Banister, MD 10/13/2021 2:36:05 PM This report has been signed electronically.

## 2021-10-13 NOTE — Progress Notes (Signed)
HPI: This is a man with IDA   ROS: complete GI ROS as described in HPI, all other review negative.  Constitutional:  No unintentional weight loss   Past Medical History:  Diagnosis Date   Anemia    Arthritis    GERD (gastroesophageal reflux disease)    Heart murmur    Hiatal hernia    Hypertension    Sleep apnea     Past Surgical History:  Procedure Laterality Date   ANKLE SURGERY     CIRCUMCISION     HERNIA REPAIR      Current Outpatient Medications  Medication Sig Dispense Refill   amLODipine (NORVASC) 5 MG tablet Take 5 mg by mouth daily.     atorvastatin (LIPITOR) 10 MG tablet atorvastatin 10 mg tablet  TAKE 1 TABLET BY MOUTH EVERYDAY AT BEDTIME     cloNIDine (CATAPRES) 0.2 MG tablet Take 0.2 mg by mouth 2 (two) times daily.     losartan-hydrochlorothiazide (HYZAAR) 100-25 MG tablet Take 1 tablet by mouth daily.     pantoprazole (PROTONIX) 40 MG tablet Take 40 mg by mouth daily.     ferrous sulfate 325 (65 FE) MG tablet Take 1 tablet (325 mg total) by mouth 2 (two) times daily with a meal. 30 tablet 0   labetalol (NORMODYNE) 200 MG tablet Take 200 mg by mouth 2 (two) times daily.     Na Sulfate-K Sulfate-Mg Sulf (SUPREP BOWEL PREP KIT) 17.5-3.13-1.6 GM/177ML SOLN Take 1 kit by mouth as directed. 324 mL 0   Omega-3 Fatty Acids (FISH OIL) 1200 MG CAPS Take 1 capsule by mouth in the morning and at bedtime.     triamcinolone ointment (KENALOG) 0.1 %      Current Facility-Administered Medications  Medication Dose Route Frequency Provider Last Rate Last Admin   0.9 %  sodium chloride infusion  500 mL Intravenous Once Milus Banister, MD        Allergies as of 10/13/2021 - Review Complete 10/13/2021  Allergen Reaction Noted   Sulfa antibiotics Itching 11/11/2016    Family History  Problem Relation Age of Onset   Healthy Mother    Healthy Father     Social History   Socioeconomic History   Marital status: Married    Spouse name: Not on file   Number of  children: Not on file   Years of education: Not on file   Highest education level: Not on file  Occupational History   Not on file  Tobacco Use   Smoking status: Some Days    Types: Cigars   Smokeless tobacco: Current    Types: Chew  Substance and Sexual Activity   Alcohol use: Yes   Drug use: Not Currently   Sexual activity: Not on file  Other Topics Concern   Not on file  Social History Narrative   Not on file   Social Determinants of Health   Financial Resource Strain: Not on file  Food Insecurity: Not on file  Transportation Needs: Not on file  Physical Activity: Not on file  Stress: Not on file  Social Connections: Not on file  Intimate Partner Violence: Not on file     Physical Exam: BP (!) 105/51    Pulse (!) 52    Temp (!) 97.1 F (36.2 C)    Ht 5' 9"  (1.753 m)    Wt 225 lb (102.1 kg)    SpO2 96%    BMI 33.23 kg/m  Constitutional: generally well-appearing Psychiatric: alert and  oriented x3 Lungs: CTA bilaterally Heart: no MCR  Assessment and plan: 70 y.o. male with IDA  Colonosc opy and EGD today  Care is appropriate for the ambulatory setting.  George Loffler, MD [facility] 10/13/2021, 1:47 PM

## 2021-10-14 ENCOUNTER — Other Ambulatory Visit: Payer: Self-pay

## 2021-10-14 ENCOUNTER — Telehealth: Payer: Self-pay

## 2021-10-14 DIAGNOSIS — C189 Malignant neoplasm of colon, unspecified: Secondary | ICD-10-CM

## 2021-10-14 LAB — CEA: CEA: 25.9 ng/mL — ABNORMAL HIGH

## 2021-10-14 NOTE — Telephone Encounter (Signed)
CT has been ordered. Schedulers made aware. Referral to [facility] faxed over.

## 2021-10-15 ENCOUNTER — Telehealth: Payer: Self-pay

## 2021-10-15 NOTE — Telephone Encounter (Signed)
°  Follow up Call-  Call back number 10/13/2021  Post procedure Call Back phone  # [phone]  Permission to leave phone message Yes  Some recent data might be hidden     Patient questions:  Do you have a fever, pain , or abdominal swelling? No. Pain Score  0 *  Have you tolerated food without any problems? Yes.    Have you been able to return to your normal activities? Yes.    Do you have any questions about your discharge instructions: Diet   No. Medications  No. Follow up visit  No.  Do you have questions or concerns about your Care? No.  Actions: * If pain score is 4 or above: No action needed, pain <4.

## 2021-10-21 ENCOUNTER — Ambulatory Visit (HOSPITAL_COMMUNITY)
Admission: RE | Admit: 2021-10-21 | Discharge: 2021-10-21 | Disposition: A | Discharge status: Home / Self Care | Payer: Medicare PPO | Source: Ambulatory Visit | Attending: Gastroenterology | Admitting: Gastroenterology

## 2021-10-21 ENCOUNTER — Encounter (HOSPITAL_COMMUNITY): Payer: Self-pay

## 2021-10-21 ENCOUNTER — Other Ambulatory Visit: Payer: Self-pay

## 2021-10-21 DIAGNOSIS — C189 Malignant neoplasm of colon, unspecified: Secondary | ICD-10-CM | POA: Insufficient documentation

## 2021-10-21 MED ORDER — SODIUM CHLORIDE (PF) 0.9 % IJ SOLN
INTRAMUSCULAR | Status: AC
  Filled 2021-10-21: qty 50

## 2021-10-21 MED ORDER — IOHEXOL 350 MG/ML SOLN
75.0000 mL | Freq: Once | INTRAVENOUS | Status: AC | PRN
  Administered 2021-10-21: 08:00:00 80 mL via INTRAVENOUS

## 2021-10-26 ENCOUNTER — Telehealth: Payer: Self-pay | Admitting: Physician Assistant

## 2021-10-26 ENCOUNTER — Other Ambulatory Visit: Payer: Self-pay

## 2021-10-26 DIAGNOSIS — C189 Malignant neoplasm of colon, unspecified: Secondary | ICD-10-CM

## 2021-10-26 NOTE — Telephone Encounter (Signed)
Scheduled appt per 1/3 referral. Pt is aware of appt date and time.

## 2021-10-28 DIAGNOSIS — C182 Malignant neoplasm of ascending colon: Secondary | ICD-10-CM | POA: Insufficient documentation

## 2021-10-28 DIAGNOSIS — C19 Malignant neoplasm of rectosigmoid junction: Secondary | ICD-10-CM | POA: Insufficient documentation

## 2021-10-28 NOTE — Progress Notes (Signed)
[place] [phone]   [phone]  CONSULT NOTE  REFERRING PHYSICIAN: Dr. Ardis Hughs  REASON FOR CONSULTATION:  Newly diagnosed colorectal cancer  HPI George Watkins is a 71 y.o. male with a past medical history significant for iron deficiency anemia, hypertension, GERD, hiatal hernia, and hyperlipidemia is referred to the clinic for newly diagnosed colorectal cancer.   The patient saw Dr. Ardis Hughs from gastroenterology in 10/07/2020 for the chief complaint of significant iron deficiency anemia (Hbg 6.9 on 05/22/20 per chart review) and generalized intermittent abdominal pain.  Dr. Ardis Hughs note indicated that he was concerned about an underlying neoplasm given the severe iron deficiency anemia and the fact that he lost 5 to 10 pounds in the last 6 months prior to that appointment.  Dr. Ardis Hughs recommended endoscopic work-up including colonoscopy and upper endoscopy.  The patient canceled this procedure due to taking care of his ill wife who was diagnosed with what sounds like is cholangiocarcinoma and is also a patient in the clinic.   The patient then followed back up with Dr. Ardis Hughs in August 25, 2021 for the same concern.  He was still reporting generalized abdominal pain and weight loss of 20 pounds in 1 year. Regarding his anemia, he had a good response to taking ferrous sulfate BID, which he is compliant with. He never has require a blood transfusion or iron infusion to date. Last Hbg from 10/13/21 11.2.   Dr. Ardis Hughs arrange for upper endoscopy and colonoscopy which was performed on 10/13/2021.  Unfortunately, the patient's colonoscopy noted a malignant appearing 4 cm mass near the hepatic flexure.  The patient's endoscopy was normal.  His baseline CEA was elevated at 25.9.  The patient had a CT scan of the chest, abdomen, and pelvis to complete the staging work-up which was performed on 10/21/2021.  The CT scan showed a circumferential mass in the mid  ascending colon measuring 5 cm in length.  There was some adjacent mesenteric fat stranding suggestive of invasion beyond the muscularis as well as abnormally enlarged mesocolonic lymph nodes.  There was a mildly prominent, asymmetric right external iliac lymph node which is nonspecific although remains suspicious for early nodal metastatic disease. Other incidental findings include asymmetric prominent left axillary lymph nodes which are nonspecific and less favored to reflect metastatic disease given the location and the absence of any other metastatic disease above the diaphragm.  There was also an exophytic soft tissue attenuation along the anterior midportion of the left kidney measuring 1.2 cm.  As well as a partially exophytic soft tissue attenuation lesion along the posterior inferior pole of the left kidney measuring 1.9 x 1.7 cm which may reflect hemorrhagic or proteinaceous cyst but it was incompletely characterized but could represent incidental renal cell carcinoma.  He is scheduled to see Dr. Marcello Moores from surgery later today.  Overall, he is feeling fair. Regarding the abdominal pain, he characterizes his pain as achy and intermittent. It occurs at least once a day and lasts about an hour or less before resolving. The patient gestured that the pain starts near the umbilicus moving outward bilaterally across the abdomen. He rates the pain a 5/10. Bowel movements and passing gas helps relieve the pain. He denies constipation. He denies associated nausea, vomiting, or reflux. Denies hematochezia. He reports his stools are dark brown but not black. He describes his stools as being a normal consistency and caliber. His last bowel movement was yesterday. He has not tried taking any  medication for his abdominal pain.  He denies any fever or chills.  He reports that he has night sweats 2 to 3 days a week on average and has had this for many years.  Overall, he does not have a good appetite due to early  satiety and eats 1 meal a day. He does not drink any supplemental drinks. He reports fatigue.   Regarding the left axillary adenopathy, he denies upper extremity or breast tissue skin infections. Denies axillary pain or swelling.  Denies any joint or shoulder pain.  Denies breast masses. He had an area that needed to be lanced in his left axilla over 30 years ago but nothing recently. His last vaccine in his left arm was the 3rd COVID vaccine which was on 10/20/20.   The patient denies any family history of malignancy to his knowledge.  He does not know his father's past medical history but his father passed away at the age of 60 during an unknown surgery.  The patient's mother had "heart trouble" and was on dialysis.  Patient has a half sibling who had an unknown type of brain surgery approximately 5 years ago.   The patient is accompanied by his wife of over 28 years today.  The patient lives at home in a 1 level house with his wife and 103 year old granddaughter who is in the process of moving out.  The patient walks without any assistive devices and is independent with his activities of daily living.  The patient chews tobacco multiple times a day and has done so over the past 30 years.  He smokes cigars about 1 time per 1 to 2 months.  He does not drink alcohol frequently and estimates that he drinks on average 1 beer per month.  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  He still works as a Retail buyer 2 hours a day.    HPI  Past Medical History:  Diagnosis Date   Anemia    Arthritis    GERD (gastroesophageal reflux disease)    Heart murmur    Hiatal hernia    Hypertension    Sleep apnea     Past Surgical History:  Procedure Laterality Date   ANKLE SURGERY     CIRCUMCISION     HERNIA REPAIR      Family History  Problem Relation Age of Onset   Healthy Mother    Healthy Father     Social History Social History   Tobacco Use   Smoking status: Some Days     Types: Cigars   Smokeless tobacco: Current    Types: Chew  Substance Use Topics   Alcohol use: Yes   Drug use: Not Currently    Allergies  Allergen Reactions   Sulfa Antibiotics Itching    Current Outpatient Medications  Medication Sig Dispense Refill   amLODipine (NORVASC) 5 MG tablet Take 5 mg by mouth daily.     atorvastatin (LIPITOR) 10 MG tablet atorvastatin 10 mg tablet  TAKE 1 TABLET BY MOUTH EVERYDAY AT BEDTIME     cloNIDine (CATAPRES) 0.2 MG tablet Take 0.2 mg by mouth 2 (two) times daily.     ferrous sulfate 325 (65 FE) MG tablet Take 1 tablet (325 mg total) by mouth 2 (two) times daily with a meal. 30 tablet 0   labetalol (NORMODYNE) 200 MG tablet Take 200 mg by mouth 2 (two) times daily.     losartan-hydrochlorothiazide (HYZAAR) 100-25 MG tablet Take 1 tablet  by mouth daily.     Multiple Vitamins-Minerals (MULTIVITAMIN MEN 50+ PO) Take by mouth.     triamcinolone ointment (KENALOG) 0.1 %      Omega-3 Fatty Acids (FISH OIL) 1200 MG CAPS Take 1 capsule by mouth in the morning and at bedtime. (Patient not taking: Reported on 10/29/2021)     pantoprazole (PROTONIX) 40 MG tablet Take 40 mg by mouth daily.     No current facility-administered medications for this visit.    REVIEW OF SYSTEMS:   Review of Systems  Constitutional: Positive for fatigue and decreased appetite.  Positive for 20 pound weight loss over the last year or so.  Negative for chills and fever. HENT: Negative for mouth sores, nosebleeds, sore throat and trouble swallowing.   Eyes: Negative for eye problems and icterus.  Respiratory: Negative for cough, hemoptysis, shortness of breath and wheezing.   Cardiovascular: Negative for chest pain and leg swelling.  Gastrointestinal: Positive for generalized abdominal pain.  Negative for  constipation, diarrhea, nausea and vomiting.  Genitourinary: Negative for bladder incontinence, difficulty urinating, dysuria, frequency and hematuria.   Musculoskeletal:  Negative for back pain, gait problem, neck pain and neck stiffness.  Skin: Negative for itching and rash.  Neurological: Negative for dizziness, extremity weakness, gait problem, headaches, light-headedness and seizures.  Hematological: Negative for adenopathy. Does not bruise/bleed easily.  Psychiatric/Behavioral: Negative for confusion, depression and sleep disturbance. The patient is not nervous/anxious.     PHYSICAL EXAMINATION:  Blood pressure 118/79, pulse (!) 57, temperature (!) 97.2 F (36.2 C), resp. rate 18, weight 220 lb 1.6 oz (99.8 kg), SpO2 100 %.  ECOG PERFORMANCE STATUS: 1  Physical Exam  Constitutional: Oriented to person, place, and time and well-developed, well-nourished, and in no distress.  HENT:  Head: Normocephalic and atraumatic.  Mouth/Throat: Oropharynx is clear and moist. No oropharyngeal exudate.  Eyes: Conjunctivae are normal. Right eye exhibits no discharge. Left eye exhibits no discharge. No scleral icterus.  Neck: Normal range of motion. Neck supple.  Cardiovascular: Normal rate, regular rhythm, normal heart sounds and intact distal pulses.   Pulmonary/Chest: Effort normal and breath sounds normal. No respiratory distress. No wheezes. No rales.  Abdominal: Soft. Bowel sounds are normal. Exhibits no distension and no mass. Mild tenderness near umbilicus and RLQ.  Musculoskeletal: Normal range of motion. Exhibits no edema.  Lymphadenopathy:    No cervical adenopathy, supraclavicular, or palpable axillary adenopathy. Neurological: Alert and oriented to person, place, and time. Exhibits normal muscle tone. Gait normal. Coordination normal.  Skin: No overlying skin changes or obvious signs of infection over upper extremity, chest, or axillary region. Skin is warm and dry. No rash noted. Not diaphoretic. No erythema. No pallor.  Breasts: Breast inspection showed them to be symmetrical with no nipple discharge. Palpation of the breasts and axilla revealed no  obvious mass that I could appreciate. Psychiatric: Mood, memory and judgment normal.  Vitals reviewed.  LABORATORY DATA: Lab Results  Component Value Date   WBC 6.3 10/13/2021   HGB 11.2 (L) 10/13/2021   HCT 35.8 (L) 10/13/2021   MCV 83.6 10/13/2021   PLT 216.0 10/13/2021      Chemistry      Component Value Date/Time   NA 138 10/13/2021 1518   K 3.4 (L) 10/13/2021 1518   CL 99 10/13/2021 1518   CO2 33 (H) 10/13/2021 1518   BUN 14 10/13/2021 1518   CREATININE 1.30 10/13/2021 1518      Component Value Date/Time   CALCIUM 8.9  10/13/2021 1518   ALKPHOS 50 10/13/2021 1518   AST 24 10/13/2021 1518   ALT 31 10/13/2021 1518   BILITOT 1.2 10/13/2021 1518       RADIOGRAPHIC STUDIES: CT CHEST ABDOMEN PELVIS W CONTRAST  Result Date: 10/21/2021 CLINICAL DATA:  New diagnosis colon cancer at recent colonoscopy EXAM: CT CHEST, ABDOMEN, AND PELVIS WITH CONTRAST TECHNIQUE: Multidetector CT imaging of the chest, abdomen and pelvis was performed following the standard protocol during bolus administration of intravenous contrast. CONTRAST:  74m OMNIPAQUE IOHEXOL 350 MG/ML SOLN, additional oral enteric contrast COMPARISON:  None. FINDINGS: CT CHEST FINDINGS Cardiovascular: Aortic atherosclerosis. Normal heart size. No pericardial effusion. Mediastinum/Nodes: Asymmetrically prominent left axillary lymph nodes measuring up to 1.3 x 0.7 cm (series 2, image 17). No other enlarged mediastinal, hilar, or axillary lymph nodes. Thyroid gland, trachea, and esophagus demonstrate no significant findings. Lungs/Pleura: Lungs are clear. No pleural effusion or pneumothorax. Musculoskeletal: No chest wall mass or suspicious bone lesions identified. CT ABDOMEN PELVIS FINDINGS Hepatobiliary: No solid liver abnormality is seen. No gallstones, gallbladder wall thickening, or biliary dilatation. Pancreas: Unremarkable. No pancreatic ductal dilatation or surrounding inflammatory changes. Spleen: Normal in size without  significant abnormality. Adrenals/Urinary Tract: Adrenal glands are unremarkable. Soft tissue attenuation exophytic lesion of the anterior midportion of the left kidney measuring 1.2 cm (series 2, image 71). Partially exophytic soft tissue attenuation lesion of the posteroinferior pole of left kidney measuring 1.9 x 1.7 cm (series 2, image 77). Thickening of the decompressed urinary bladder wall. Stomach/Bowel: Stomach is within normal limits. Appendix appears normal. There is a circumferential mass of the mid ascending colon measuring approximately 5 cm in length (series 4, image 69). There is adjacent mesenteric fat stranding and abnormally enlarged mesocolonic lymph nodes measuring up to 1.6 x 1.2 cm (series 2, image 71). Vascular/Lymphatic: Aortic atherosclerosis. There are mildly prominent, asymmetric right external iliac lymph nodes measuring up to 1.3 x 0.6 cm (series 2, image 102). Reproductive: Prostatomegaly. Other: No abdominal wall hernia or abnormality. No abdominopelvic ascites. Musculoskeletal: No acute or significant osseous findings. IMPRESSION: 1. There is a circumferential mass of the mid ascending colon measuring approximately 5 cm in length, in keeping with newly diagnosed primary colon malignancy. 2. Adjacent to the colon mass, there is mesenteric fat stranding suggesting invasion beyond the muscularis as well as abnormally enlarged mesocolonic lymph nodes. 3. Mildly prominent, asymmetric right external iliac lymph nodes, nonspecific although suspicious for early nodal metastatic disease. 4. Asymmetrically prominent left axillary lymph nodes, nonspecific, these nodes less favored to reflect metastatic disease given location in the absence of other evidence of metastatic disease above the diaphragm. Both external iliac and axillary lymph nodes could be characterized for abnormal metabolic activity by PET-CT. 5. No evidence of organ metastatic disease in the chest, abdomen, or pelvis. 6.  Exophytic soft tissue attenuation lesion of the anterior midportion of the left kidney measuring 1.2 cm. Partially exophytic soft tissue attenuation lesion of the posteroinferior pole of the left kidney measuring 1.9 x 1.7 cm. These may reflect hemorrhagic or proteinaceous cysts but are incompletely characterized and incidental renal cell carcinoma is not excluded. Recommend initial renal ultrasound to establish definitively cystic character, multiphasic contrast enhanced CT or MRI may however be further required to exclude malignancy. 7. Prostatomegaly with thickening of the decompressed urinary bladder wall, likely due to chronic outlet obstruction. Aortic Atherosclerosis (ICD10-I70.0). Electronically Signed   By: ADelanna AhmadiM.D.   On: 10/21/2021 16:23    ASSESSMENT:  This is a  very pleasant 71 year old African-American male with:   1) Colorectal cancer, adenocarcinoma of the ascending colon.  Diagnosed in December 2022.  -Colonoscopy performed on 12/21 for the chief complaint of IDA and abdominal pain showed a malignant appearing 4 cm mass near the hepatic flexure.   -Baseline CEA was elevated at 25.9 on 10/13/21 -CT scan of C/A/P on 10/21/21 showed circumferential mass in the mid ascending colon, mildly prominent, asymmetric right external iliac lymph node which is nonspecific although remains suspicious for early nodal metastatic disease.   -The final pathology ( Accession: [id number]) was consistent with adenocarcinoma. MMR Requested today (10/29/21).  -The patient was seen with Dr. Burr Medico today.  Dr. Burr Medico had a lengthy discussion today with the patient for his current condition and recommended treatment options. Dr. Burr Medico recommended arranging for a PET scan to complete the staging work-up.  This would also allow [place] to evaluate the left renal lesion, abdominal lymph nodes, and the left axillary adenopathy.  -Dr. Burr Medico discussed that if he is found to have metastatic disease, she would likely arrange  for biopsy to prove metastatic disease.  -If the patient does not have metastatic disease, the patient may be a candidate for surgery.  The patient is scheduled to see Dr. Marcello Moores later today from [facility].  Dr. Burr Medico discussed her role in the patient's care would be to possibly provide additional chemotherapy before or after surgery based on surgery evaluation and his staging PET scan to assess for lymph node involvement/metastatic disease.  -This patient's case will also likely be discussed at the multidisciplinary GI tumor board conference next week. -We will arrange for follow-up visit after the patient has a staging PET scan performed and surgical evaluation.   2.  Abdominal discomfort and weight loss -Patient reports generalized abdominal pain, intermittent, and achy.  Rates pain 5 out of 10.  Resolve spontaneously.  -Encouraged the patient to try taking Tylenol for now if needed.  He is not needed to take any pain medication thus far. -Regarding the 20 pound weight loss over the past year, discussed the importance of good nutrition with the patient at length.  Discussed if he is ever interested, we do have nutritionist here at the cancer center.  For now, he was encouraged to increase his caloric intake with high-calorie high-protein foods.  Encouraged to drink supplemental drinks and to eat small frequent meals.   3.  Iron deficiency anemia  -Patient currently taking ferrous sulfate BID and compliant.  Had good response to oral iron supplement.  Has not required iron infusion or blood transfusion.  -Last hemoglobin (10/13/21) 11.2.  -May consider repeating iron studies and ferritin in the future.   4. Renal Lesion -Staging CT incidentally noted: exophytic soft tissue attenuation lesion of the anterior midportion of the left kidney measuring 1.2 cm. Partially exophytic soft tissue attenuation lesion of the posteroinferior pole of the left kidney measuring 1.9 x 1.7 cm. These may  reflect hemorrhagic or proteinaceous cysts but are incompletely characterized and incidental renal cell carcinoma is not excluded. Dr. Burr Medico recommends arranging for staging PET scan which will also evaluate this area.   Left Axillary Adenopathy: -No suspicious signs and symptoms of upper extremity, chest, or shoulder infection.  -Adenopathy not appreciated on exam today -Patient denies any recent vaccines in this area.  His most recent COVID-vaccine was on 10/20/2020 -We will evaluate this area on upcoming PET scan.  PLAN: -MMR requested -Ordered PET scan -Follow up after PET scan and surgical evaluation  Thank you so much for allowing me to participate in the care of [place]. I will continue to follow up the patient with you and assist in his care.  Disclaimer: This note was dictated with voice recognition software. Similar sounding words can inadvertently be transcribed and may not be corrected upon review.   Miliyah Luper L Safwan Tomei October 29, 2021, 12:39 PM   Addendum I have seen the patient, examined him. I agree with the assessment and and plan and have edited the notes.   71 yo male with PMH of iron deficiency anemia, hypertension, GERD, hiatal hernia, and hyperlipidemia is referred to the clinic for newly diagnosed colorectal cancer. His IDA has been going one over a year, and he responded well to oral iron.  I discussed his colonoscopy, CT scan, and biopsy results with the patient and his wife in detail. He has adenocarcinoma in ascending colon, with probable regional nodal metastasis.CT scan also showed left axillar adenopathy, which would be unusual pattern for colon cancer related metastasis.  Commend PET scan for further evaluation, to see if he needs additional biopsy.  He also has 2 incidental finding of cystic lesion in the left kidney, which may require further evaluation after PET.  If PET scan shows no evidence of distant metastasis, he will likely proceed with  hemicolectomy and lymph node dissection, he plan to meet his surgeon Dr. Marcello Moores this afternoon.  All questions were answered.   Truitt Merle  10/29/2021

## 2021-10-29 ENCOUNTER — Other Ambulatory Visit: Payer: Self-pay

## 2021-10-29 ENCOUNTER — Encounter: Payer: Self-pay | Admitting: Physician Assistant

## 2021-10-29 ENCOUNTER — Ambulatory Visit: Payer: Self-pay | Admitting: General Surgery

## 2021-10-29 ENCOUNTER — Inpatient Hospital Stay: Payer: Medicare PPO | Attending: Physician Assistant | Admitting: Emergency Medicine

## 2021-10-29 ENCOUNTER — Inpatient Hospital Stay (HOSPITAL_BASED_OUTPATIENT_CLINIC_OR_DEPARTMENT_OTHER): Payer: Medicare PPO | Admitting: Physician Assistant

## 2021-10-29 VITALS — BP 118/79 | HR 57 | Temp 97.2°F | Resp 18 | Wt 220.1 lb

## 2021-10-29 DIAGNOSIS — R634 Abnormal weight loss: Secondary | ICD-10-CM

## 2021-10-29 DIAGNOSIS — R1084 Generalized abdominal pain: Secondary | ICD-10-CM

## 2021-10-29 DIAGNOSIS — R590 Localized enlarged lymph nodes: Secondary | ICD-10-CM | POA: Insufficient documentation

## 2021-10-29 DIAGNOSIS — I10 Essential (primary) hypertension: Secondary | ICD-10-CM

## 2021-10-29 DIAGNOSIS — C19 Malignant neoplasm of rectosigmoid junction: Secondary | ICD-10-CM

## 2021-10-29 DIAGNOSIS — D509 Iron deficiency anemia, unspecified: Secondary | ICD-10-CM

## 2021-10-29 DIAGNOSIS — C182 Malignant neoplasm of ascending colon: Secondary | ICD-10-CM | POA: Insufficient documentation

## 2021-10-29 DIAGNOSIS — F17290 Nicotine dependence, other tobacco product, uncomplicated: Secondary | ICD-10-CM | POA: Diagnosis not present

## 2021-10-29 DIAGNOSIS — N289 Disorder of kidney and ureter, unspecified: Secondary | ICD-10-CM | POA: Insufficient documentation

## 2021-10-29 NOTE — H&P (View-Only) (Signed)
REFERRING PHYSICIAN:  Milus Banister, MD  PROVIDER:  Monico Blitz, MD  MRN: [id number] DOB: Mar 26, 1951 DATE OF ENCOUNTER: 10/29/2021  Subjective   Chief Complaint: New Patient (New cancer malignant neoplasm of colon )     History of Present Illness: George Watkins is a 71 y.o. male who is seen today as an office consultation at the request of Dr. Ardis Hughs for evaluation of New Patient (New cancer malignant neoplasm of colon )  71 year old male who recently underwent a colonoscopy due to iron deficiency anemia and abdominal pain.  A malignant appearing mass was found in the hepatic flexure.  It was biopsied and tattooed distally.  Several other polyps were removed.  Biopsy showed adenocarcinoma.  CEA level is 25.9.  CT scan of the chest shows a mid a sending colon mass approximately 5 cm in length.  There are large mesocolic lymph nodes.  Mildly prominent axillary and iliac lymph nodes that are indeterminate at this time.  No other evidence of organ metastatic disease in the chest, abdomen or pelvis.  He saw Dr Burr Medico earlier today.     Review of Systems: A complete review of systems was obtained from the patient.  I have reviewed this information and discussed as appropriate with the patient.  See HPI as well for other ROS.   Medical History: Past Medical History:  Diagnosis Date   Arthritis     Patient Active Problem List  Diagnosis   Colorectal cancer (CMS-HCC)   Hypertensive disorder   Pain in thoracic spine    History reviewed. No pertinent surgical history.   No Known Allergies  Current Outpatient Medications on File Prior to Visit  Medication Sig Dispense Refill   amLODIPine (NORVASC) 5 MG tablet Take 5 mg by mouth once daily     atorvastatin (LIPITOR) 10 MG tablet atorvastatin 10 mg tablet  TAKE 1 TABLET BY MOUTH EVERYDAY AT BEDTIME     cloNIDine HCL (CATAPRES) 0.2 MG tablet Take 0.2 mg by mouth 2 (two) times daily     labetaloL (TRANDATE) 200 MG tablet Take  200 mg by mouth 2 (two) times daily     losartan-hydrochlorothiazide (HYZAAR) 100-25 mg tablet Take 1 tablet by mouth once daily     multivitamin with minerals tablet Take by mouth     No current facility-administered medications on file prior to visit.    Family History  Problem Relation Age of Onset   High blood pressure (Hypertension) Mother      Social History   Tobacco Use  Smoking Status Never  Smokeless Tobacco Never     Social History   Socioeconomic History   Marital status: Married  Tobacco Use   Smoking status: Never   Smokeless tobacco: Never  Substance and Sexual Activity   Alcohol use: Yes    Comment: social   Drug use: Never    Objective:    Vitals:   10/29/21 1409  BP: 138/80  Pulse: 90  Temp: 36.8 C (98.2 F)  SpO2: 99%  Weight: 99.5 kg (219 lb 6.4 oz)  Height: 175.3 cm (5\' 9" )     Exam Gen: NAD CV: RRR Lungs: CTA Abd: soft    Labs, Imaging and Diagnostic Testing: CT C/A/P IMPRESSION: 1. There is a circumferential mass of the mid ascending colon measuring approximately 5 cm in length, in keeping with newly diagnosed primary colon malignancy. 2. Adjacent to the colon mass, there is mesenteric fat stranding suggesting invasion beyond the muscularis as well as  abnormally enlarged mesocolonic lymph nodes. 3. Mildly prominent, asymmetric right external iliac lymph nodes, nonspecific although suspicious for early nodal metastatic disease. 4. Asymmetrically prominent left axillary lymph nodes, nonspecific, these nodes less favored to reflect metastatic disease given location in the absence of other evidence of metastatic disease above the diaphragm. Both external iliac and axillary lymph nodes could be characterized for abnormal metabolic activity by PET-CT. 5. No evidence of organ metastatic disease in the chest, abdomen, or pelvis. 6. Exophytic soft tissue attenuation lesion of the anterior midportion of the left kidney measuring 1.2  cm. Partially exophytic soft tissue attenuation lesion of the posteroinferior pole of the left kidney measuring 1.9 x 1.7 cm. These may reflect hemorrhagic or proteinaceous cysts but are incompletely characterized and incidental renal cell carcinoma is not excluded. Recommend initial renal ultrasound to establish definitively cystic character, multiphasic contrast enhanced CT or MRI may however be further required to exclude malignancy. 7. Prostatomegaly with thickening of the decompressed urinary bladder wall, likely due to chronic outlet obstruction.  CT images personally reviewed.  Mass in the distal ascending colon  Assessment and Plan:  Diagnoses and all orders for this visit:  Colon cancer, ascending (CMS-HCC) -     polyethylene glycol (MIRALAX) powder; Take 233.75 g by mouth once for 1 dose Take according to your procedure prep instructions. -     bisacodyL (DULCOLAX) 5 mg EC tablet; Take 4 tablets (20 mg total) by mouth once daily as needed for Constipation for up to 1 dose -     metroNIDAZOLE (FLAGYL) 500 MG tablet; Take 2 tablets (1,000 mg total) by mouth 3 (three) times daily for 3 doses Take according to your procedure colon prep instructions -     neomycin 500 mg tablet; Take 2 tablets (1,000 mg total) by mouth 3 (three) times daily for 3 doses Take according to your procedure colon prep instructions  71 year old male who presents to the office for evaluation of a newly diagnosed ascending colon mass.  This was seen on recent colonoscopy and biopsied.  It was also tattooed distally.  Pathology shows adenocarcinoma.  CT scan showed a mass in the distal ascending colon with some pericolonic lymphadenopathy but no signs of metastatic disease.  A renal mass was noted as well as some enlarged lymph nodes.  He is scheduled to undergo a PET scan to evaluate this further.  We discussed proceeding with a robotic assisted partial colectomy.  We have discussed this in detail.  All questions  were answered.  The surgery and anatomy were described to the patient as well as the risks of surgery and the possible complications.  These include: Bleeding, deep abdominal infections and possible wound complications such as hernia and infection, damage to adjacent structures, leak of surgical connections, which can lead to other surgeries and possibly an ostomy, possible need for other procedures, such as abscess drains in radiology, possible prolonged hospital stay, possible diarrhea from removal of part of the colon, possible constipation from narcotics, possible bowel, bladder or sexual dysfunction if having rectal surgery, prolonged fatigue/weakness or appetite loss, possible early recurrence of of disease, possible complications of their medical problems such as heart disease or arrhythmias or lung problems, death (less than 1%). I believe the patient understands and wishes to proceed with the surgery.

## 2021-10-29 NOTE — Research (Signed)
MTG-015 - Tissue and Bodily Fluids: Translational Medicine: Discovery and Evaluation of Biomarkers/Pharmacogenomics for the Diagnosis and Personalized Management of Patients   MTG 98242  INTRODUCTION CONSENTS VISIT  Patient George TOMPSON was identified by Clinical Research Coordinator Clabe Seal as a potential candidate for the above listed study.  This Clinical Research Nurse met with BREYON SIGG, [id number], on 10/29/21 in a manner and location that ensures patient privacy to discuss participation in the above listed research study.  Patient is Accompanied by wife [name] .  A copy of the informed consent document and separate HIPAA Authorization was provided to the patient.  Patient reads, speaks, and understands [place].   Patient was provided with the business card of this Nurse and encouraged to contact the research team with any questions.  Approximately 15 minutes were spent with the patient reviewing the informed consent documents.  Patient was provided the option of taking informed consent documents home to review and was encouraged to review at their convenience with their support network, including other care providers. Patient took the consent documents home to review.  CRC Clabe Seal will f/u with patient early next week to determine interest in study.  Wells Guiles 'Learta CoddingNeysa Bonito, RN, BSN Clinical Research Nurse I 10/29/21 12:55 PM

## 2021-10-29 NOTE — Progress Notes (Signed)
I met with George Watkins and his wife after his consultation with Cassie Heilingoepter, PA and Dr Burr Medico.  I explained my role as a nurse navigator and provided my contact information. I explained the services provided at [facility] and provided written information.  I explained the alight grant and let  them know one of the financial advisors will reach out to  him at the time of his chemo education class. I briefly explained insertion and care of a port a cath.  I showed a sample of the port a cath.   All questions were answered.  They verbalized understanding.

## 2021-10-29 NOTE — Patient Instructions (Signed)
It was a pleasure meeting you today.  I know we covered a lot of important information.  Here is a summary of the most important points.  -The sample (biopsy) Dr. Ardis Hughs took from your colon is consistent with colorectal cancer.  I have more information about colorectal cancer on the following pages.  -Dr. Ardis Hughs arrange for that CT scan of the chest, abdomen, and pelvis to look to make sure that it has not spread to other locations.  Some of the lymph nodes in the left armpit and abdomen look enlarged.  Dr. Burr Medico would like for you to have a PET scan performed to see if these areas have any suspicious activity.  I would expect for this to be performed in the next 7 to 10 days.  I have placed the order.  Someone will call you from the radiology department in order to get this scheduled.  However, you have their number in case you need to call them.  We will see you back for follow-up visit once this has been complete and after you see the surgeon for more detailed discussion about possible chemotherapy based on the results. -In the meantime, please work on getting good nutrition.  I would recommend drinking supplemental drinks such as boost and Ensure as well as eating high-calorie high-protein foods.  I would also recommend quitting smoking cigars and chewing tobacco at this time.  -We will be in touch about a follow-up visit after you meet with a surgeon and after you have the scan done.

## 2021-10-29 NOTE — H&P (Signed)
REFERRING PHYSICIAN:  Milus Banister, MD  PROVIDER:  Monico Blitz, MD  MRN: [id number] DOB: 1951/10/13 DATE OF ENCOUNTER: 10/29/2021  Subjective   Chief Complaint: New Patient (New cancer malignant neoplasm of colon )     History of Present Illness: George Watkins is a 71 y.o. male who is seen today as an office consultation at the request of Dr. Ardis Watkins for evaluation of New Patient (New cancer malignant neoplasm of colon )  71 year old male who recently underwent a colonoscopy due to iron deficiency anemia and abdominal pain.  A malignant appearing mass was found in the hepatic flexure.  It was biopsied and tattooed distally.  Several other polyps were removed.  Biopsy showed adenocarcinoma.  CEA level is 25.9.  CT scan of the chest shows a mid a sending colon mass approximately 5 cm in length.  There are large mesocolic lymph nodes.  Mildly prominent axillary and iliac lymph nodes that are indeterminate at this time.  No other evidence of organ metastatic disease in the chest, abdomen or pelvis.  He saw Dr George Watkins earlier today.     Review of Systems: A complete review of systems was obtained from the patient.  I have reviewed this information and discussed as appropriate with the patient.  See HPI as well for other ROS.   Medical History: Past Medical History:  Diagnosis Date   Arthritis     Patient Active Problem List  Diagnosis   Colorectal cancer (CMS-HCC)   Hypertensive disorder   Pain in thoracic spine    History reviewed. No pertinent surgical history.   No Known Allergies  Current Outpatient Medications on File Prior to Visit  Medication Sig Dispense Refill   amLODIPine (NORVASC) 5 MG tablet Take 5 mg by mouth once daily     atorvastatin (LIPITOR) 10 MG tablet atorvastatin 10 mg tablet  TAKE 1 TABLET BY MOUTH EVERYDAY AT BEDTIME     cloNIDine HCL (CATAPRES) 0.2 MG tablet Take 0.2 mg by mouth 2 (two) times daily     labetaloL (TRANDATE) 200 MG tablet Take  200 mg by mouth 2 (two) times daily     losartan-hydrochlorothiazide (HYZAAR) 100-25 mg tablet Take 1 tablet by mouth once daily     multivitamin with minerals tablet Take by mouth     No current facility-administered medications on file prior to visit.    Family History  Problem Relation Age of Onset   High blood pressure (Hypertension) Mother      Social History   Tobacco Use  Smoking Status Never  Smokeless Tobacco Never     Social History   Socioeconomic History   Marital status: Married  Tobacco Use   Smoking status: Never   Smokeless tobacco: Never  Substance and Sexual Activity   Alcohol use: Yes    Comment: social   Drug use: Never    Objective:    Vitals:   10/29/21 1409  BP: 138/80  Pulse: 90  Temp: 36.8 C (98.2 F)  SpO2: 99%  Weight: 99.5 kg (219 lb 6.4 oz)  Height: 175.3 cm (5\' 9" )     Exam Gen: NAD CV: RRR Lungs: CTA Abd: soft    Labs, Imaging and Diagnostic Testing: CT C/A/P IMPRESSION: 1. There is a circumferential mass of the mid ascending colon measuring approximately 5 cm in length, in keeping with newly diagnosed primary colon malignancy. 2. Adjacent to the colon mass, there is mesenteric fat stranding suggesting invasion beyond the muscularis as well as  abnormally enlarged mesocolonic lymph nodes. 3. Mildly prominent, asymmetric right external iliac lymph nodes, nonspecific although suspicious for early nodal metastatic disease. 4. Asymmetrically prominent left axillary lymph nodes, nonspecific, these nodes less favored to reflect metastatic disease given location in the absence of other evidence of metastatic disease above the diaphragm. Both external iliac and axillary lymph nodes could be characterized for abnormal metabolic activity by PET-CT. 5. No evidence of organ metastatic disease in the chest, abdomen, or pelvis. 6. Exophytic soft tissue attenuation lesion of the anterior midportion of the left kidney measuring 1.2  cm. Partially exophytic soft tissue attenuation lesion of the posteroinferior pole of the left kidney measuring 1.9 x 1.7 cm. These may reflect hemorrhagic or proteinaceous cysts but are incompletely characterized and incidental renal cell carcinoma is not excluded. Recommend initial renal ultrasound to establish definitively cystic character, multiphasic contrast enhanced CT or MRI may however be further required to exclude malignancy. 7. Prostatomegaly with thickening of the decompressed urinary bladder wall, likely due to chronic outlet obstruction.  CT images personally reviewed.  Mass in the distal ascending colon  Assessment and Plan:  Diagnoses and all orders for this visit:  Colon cancer, ascending (CMS-HCC) -     polyethylene glycol (MIRALAX) powder; Take 233.75 g by mouth once for 1 dose Take according to your procedure prep instructions. -     bisacodyL (DULCOLAX) 5 mg EC tablet; Take 4 tablets (20 mg total) by mouth once daily as needed for Constipation for up to 1 dose -     metroNIDAZOLE (FLAGYL) 500 MG tablet; Take 2 tablets (1,000 mg total) by mouth 3 (three) times daily for 3 doses Take according to your procedure colon prep instructions -     neomycin 500 mg tablet; Take 2 tablets (1,000 mg total) by mouth 3 (three) times daily for 3 doses Take according to your procedure colon prep instructions  71 year old male who presents to the office for evaluation of a newly diagnosed ascending colon mass.  This was seen on recent colonoscopy and biopsied.  It was also tattooed distally.  Pathology shows adenocarcinoma.  CT scan showed a mass in the distal ascending colon with some pericolonic lymphadenopathy but no signs of metastatic disease.  A renal mass was noted as well as some enlarged lymph nodes.  He is scheduled to undergo a PET scan to evaluate this further.  We discussed proceeding with a robotic assisted partial colectomy.  We have discussed this in detail.  All questions  were answered.  The surgery and anatomy were described to the patient as well as the risks of surgery and the possible complications.  These include: Bleeding, deep abdominal infections and possible wound complications such as hernia and infection, damage to adjacent structures, leak of surgical connections, which can lead to other surgeries and possibly an ostomy, possible need for other procedures, such as abscess drains in radiology, possible prolonged hospital stay, possible diarrhea from removal of part of the colon, possible constipation from narcotics, possible bowel, bladder or sexual dysfunction if having rectal surgery, prolonged fatigue/weakness or appetite loss, possible early recurrence of of disease, possible complications of their medical problems such as heart disease or arrhythmias or lung problems, death (less than 1%). I believe the patient understands and wishes to proceed with the surgery.

## 2021-11-02 NOTE — Progress Notes (Signed)
I spoke with George Watkins and let him know Dr Burr Medico will be calling him on 1/19 at 1600 to review his PET scan results.  All questions were answered.  He verbalized understanding.

## 2021-11-03 ENCOUNTER — Other Ambulatory Visit: Payer: Self-pay

## 2021-11-03 ENCOUNTER — Telehealth: Payer: Self-pay | Admitting: Emergency Medicine

## 2021-11-03 NOTE — Telephone Encounter (Signed)
MTG-015 - Tissue and Bodily Fluids: Translational Medicine: Discovery and Evaluation of Biomarkers/Pharmacogenomics for the Diagnosis and Personalized Management of Patients   11/03/21  2:20pm: Called to follow up regarding interest in participating in this study.  Patient is interested in participating.  Patient does not currently have any lab appointment scheduled at the cancer center.  He had some questions regarding his upcoming appointment schedule.  3:22pm: Called patient back after confirming appointment schedule and instructed patient regarding his upcoming appointment times.  The patient is interested in coming in the the clinic to consent to the study and have specimen collected.  Scheduled research appointment at 11:00am on Friday 11/05/2021 to consent to the study with lab to follow at 11:30am.  Patient denied questions at this time.    Clabe Seal Clinical Research Coordinator I  11/03/21  3:35 PM

## 2021-11-03 NOTE — Progress Notes (Signed)
The proposed treatment discussed in conference is for discussion purpose only and is not a binding recommendation.  The patients have not been physically examined, or presented with their treatment options.  Therefore, final treatment plans cannot be decided.  

## 2021-11-03 NOTE — Telephone Encounter (Signed)
MTG-015 - Tissue and Bodily Fluids: Translational Medicine: Discovery and Evaluation of Biomarkers/Pharmacogenomics for the Diagnosis and Personalized Management of Patients   11/03/21  3:45pm: Patient called back asking to reschedule appointment for tomorrow as he will be in the clinic with his wife at that time.  Rescheduled research appointment for tomorrow 11/04/21 at 12:00pm with lab to follow at 12:30pm.   Clabe Seal Clinical Research Coordinator I  11/03/21  3:47 PM

## 2021-11-04 ENCOUNTER — Inpatient Hospital Stay: Payer: Medicare PPO | Admitting: Emergency Medicine

## 2021-11-04 ENCOUNTER — Inpatient Hospital Stay: Payer: Medicare PPO

## 2021-11-04 ENCOUNTER — Other Ambulatory Visit: Payer: Self-pay

## 2021-11-04 ENCOUNTER — Other Ambulatory Visit: Payer: Self-pay | Admitting: *Deleted

## 2021-11-04 DIAGNOSIS — C19 Malignant neoplasm of rectosigmoid junction: Secondary | ICD-10-CM

## 2021-11-04 LAB — RESEARCH LABS

## 2021-11-04 NOTE — Research (Signed)
[id number]: This Nurse has reviewed this patient's inclusion and exclusion criteria as a second review and confirms George Watkins is eligible for study participation.  Patient may continue with enrollment.  Foye Spurling, BSN, RN, [employer] Clinical Research Nurse 11/04/2021 1:28 PM

## 2021-11-04 NOTE — Research (Signed)
MTG-015 - Tissue and Bodily Fluids: Translational Medicine: Discovery and Evaluation of Biomarkers/Pharmacogenomics for the Diagnosis and Personalized Management of Patients   11/04/21  Informed Consent:  Patient George Watkins was identified by clinical research staff as a potential candidate for the above listed study.  This Clinical Research Coordinator met with George Watkins, MRN [id number] on 11/04/21 in a manner and location that ensures patient privacy to discuss participation in the above listed research study.  Patient is Unaccompanied.  Patient was previously provided with informed consent documents.  Patient has not yet read the informed consent documents and so documents were reviewed page by page today.  As outlined in the informed consent form, this Coordinator and George Watkins discussed the purpose of the research study, the investigational nature of the study, study procedures and requirements for study participation, potential risks and benefits of study participation, as well as alternatives to participation.  This study is not blinded or double-blinded. The patient understands participation is voluntary and they may withdraw from study participation at any time.  This study does not involve randomization.  This study does not involve an investigational drug or device. This study does not involve a placebo. Patient understands enrollment is pending full eligibility review.   Confidentiality and how the patient's information will be used as part of study participation were discussed.  Patient was informed there is reimbursement provided for their time and effort spent on trial participation.  The patient is encouraged to discuss research study participation with their insurance provider to determine what costs they may incur as part of study participation, including research related injury.    All questions were answered to patient's satisfaction.  The informed consent and separate  HIPAA Authorization was reviewed page by page.  The patient's mental and emotional status is appropriate to provide informed consent, and the patient verbalizes an understanding of study participation.  Patient has agreed to participate in the above listed research study and has voluntarily signed the informed consent version IRB approved 09/13/2021 and separate HIPAA Authorization, version 5  on 11/04/21 at 12:20PM.  The patient was provided with a copy of the signed informed consent form and separate HIPAA Authorization for their reference.  No study specific procedures were obtained prior to the signing of the informed consent document.  Approximately 15 minutes were spent with the patient reviewing the informed consent documents.  Patient was not requested to complete a Release of Information form.  Medication Review:  Reviewed patient's medication list for accuracy.  Patient states he is taking cyclobenzaprine 33m at bedtime as needed.  He also states he is taking vitamin D 1000 IU daily.  He denies any other changes to his medication list.  Research Specimen:  Research specimen was collected at 12:36pm by MWellington [place]  Gift Card:  Patient was provided with a $50 visa gift card for his participation in the study.  Plan:  Patient was informed of follow up phone call to occur at 12-14 months. The patient denied questions at this time and was given this coordinator's contact information if he has any questions in the future.     KClabe SealClinical Research Coordinator I  11/04/21 12:42 PM

## 2021-11-04 NOTE — Patient Instructions (Addendum)
DUE TO COVID-19 ONLY ONE VISITOR IS ALLOWED TO COME WITH YOU AND STAY IN THE WAITING ROOM ONLY DURING PRE OP AND PROCEDURE.   **NO VISITORS ARE ALLOWED IN THE SHORT STAY AREA OR RECOVERY ROOM!!**  IF YOU WILL BE ADMITTED INTO THE HOSPITAL YOU ARE ALLOWED ONLY TWO SUPPORT PEOPLE DURING VISITATION HOURS ONLY (7 AM -8PM)    Up to two visitors ages 63+ are allowed at one time in a patient's room.  The visitors may rotate out with other people throughout the day.  Additionally, up to two children between the ages of 48 and 62 are allowed and do not count toward the number of allowed visitors.  Children within this age range must be accompanied by an adult visitor.  One adult visitor may remain with the patient overnight and must be in the room by 8 PM.  COVID SWAB TESTING MUST BE COMPLETED ON: 11-15-21 @ 8:00 AM   COME IN THROUGH MAIN ENTRANCE of [facility].  Take a seat in the lobby area to the right as you come in the main entrance.  Call [phone]  and give your name and let them know you are here for COVID testing  You are not required to quarantine, however you are required to wear a well-fitted mask when you are out and around people not in your household.  Hand Hygiene often Do NOT share personal items Notify your provider if you are in close contact with someone who has COVID or you develop fever 100.4 or greater, new onset of sneezing, cough, sore throat, shortness of breath or body aches.        Your procedure is scheduled on: Wednesday, 11-17-21   Report to [facility] Main  Entrance     Report to admitting at 9:45 AM   Call this number if you have problems the morning of surgery [phone]   Follow a clear liquid diet day of prep to avoid dehyrdration   Follow prep instructions from surgeon's office   May have liquids until 9:00 AM day of surgery  CLEAR LIQUID DIET  Foods Allowed                                                                     Foods  Excluded  Water, Black Coffee (no milk/no creamer) and tea, regular and decaf                              liquids that you cannot  Plain Jell-O in any flavor  (No red)                         see through such as: Fruit ices (not with fruit pulp)                                 milk, soups, orange juice  Iced Popsicles (No red)                                    All solid  food                             Apple juices Sports drinks like Gatorade (No red) Lightly seasoned clear broth or consume(fat free) Sugar Sample Menu Breakfast                                Lunch                                     Supper Cranberry juice                    Beef broth                            Chicken broth Jell-O                                     Grape juice                           Apple juice Coffee or tea                        Jell-O                                      Popsicle                                                Coffee or tea                        Coffee or tea      Drink 2 Ensure drinks the night before surgery, complete by 10:00 PM  Complete one Ensure drink the morning of surgery 3 hours prior to scheduled surgery by 9:00 AM     The day of surgery:  Drink ONE (1) Pre-Surgery Clear Ensure the morning of surgery. Drink in one sitting. Do not sip.  This drink was given to you during your hospital  pre-op appointment visit. Nothing else to drink after completing the Pre-Surgery Clear Ensure           If you have questions, please contact your surgeons office.     Oral Hygiene is also important to reduce your risk of infection.                                    Remember - BRUSH YOUR TEETH THE MORNING OF SURGERY WITH YOUR REGULAR TOOTHPASTE   Do NOT smoke after Midnight   Take these medicines the morning of surgery with A SIP OF WATER: Amlodipine, Clonidine, Labetalol, Pantoprazole   Stop all vitamins and herbal supplements a week before surgery             You may  not have any metal  on your body including  jewelry, and body piercing             Do not wear  lotions, powders, cologne, or deodorant              Men may shave face and neck.  Do not bring valuables to the hospital. [place].   Contacts, dentures or bridgework may not be worn into surgery.   Bring small overnight bag day of surgery.  Please read over the following fact sheets you were given: IF YOU HAVE QUESTIONS ABOUT YOUR PRE OP INSTRUCTIONS PLEASE CALL [facility] - Preparing for Surgery Before surgery, you can play an important role.  Because skin is not sterile, your skin needs to be as free of germs as possible.  You can reduce the number of germs on your skin by washing with CHG (chlorahexidine gluconate) soap before surgery.  CHG is an antiseptic cleaner which kills germs and bonds with the skin to continue killing germs even after washing. Please DO NOT use if you have an allergy to CHG or antibacterial soaps.  If your skin becomes reddened/irritated stop using the CHG and inform your nurse when you arrive at Short Stay. Do not shave (including legs and underarms) for at least 48 hours prior to the first CHG shower.  You may shave your face/neck.  Please follow these instructions carefully:  1.  Shower with CHG Soap the night before surgery and the  morning of surgery.  2.  If you choose to wash your hair, wash your hair first as usual with your normal  shampoo.  3.  After you shampoo, rinse your hair and body thoroughly to remove the shampoo.                             4.  Use CHG as you would any other liquid soap.  You can apply chg directly to the skin and wash.  Gently with a scrungie or clean washcloth.  5.  Apply the CHG Soap to your body ONLY FROM THE NECK DOWN.   Do   not use on face/ open                           Wound or open sores. Avoid contact with eyes, ears mouth and   genitals (private parts).                        Wash face,  Genitals (private parts) with your normal soap.             6.  Wash thoroughly, paying special attention to the area where your    surgery  will be performed.  7.  Thoroughly rinse your body with warm water from the neck down.  8.  DO NOT shower/wash with your normal soap after using and rinsing off the CHG Soap.                9.  Pat yourself dry with a clean towel.            10.  Wear clean pajamas.            11.  Place clean sheets on your bed the night of your first shower and do not  sleep with pets. Day of Surgery : Do  not apply any lotions/deodorants the morning of surgery.  Please wear clean clothes to the hospital/surgery center.  FAILURE TO FOLLOW THESE INSTRUCTIONS MAY RESULT IN THE CANCELLATION OF YOUR SURGERY  PATIENT SIGNATURE_________________________________  NURSE SIGNATURE__________________________________  ________________________________________________________________________   Adam Phenix  An incentive spirometer is a tool that can help keep your lungs clear and active. This tool measures how well you are filling your lungs with each breath. Taking long deep breaths may help reverse or decrease the chance of developing breathing (pulmonary) problems (especially infection) following: A long period of time when you are unable to move or be active. BEFORE THE PROCEDURE  If the spirometer includes an indicator to show your best effort, your nurse or respiratory therapist will set it to a desired goal. If possible, sit up straight or lean slightly forward. Try not to slouch. Hold the incentive spirometer in an upright position. INSTRUCTIONS FOR USE  Sit on the edge of your bed if possible, or sit up as far as you can in bed or on a chair. Hold the incentive spirometer in an upright position. Breathe out normally. Place the mouthpiece in your mouth and seal your lips tightly around it. Breathe in slowly and as deeply as possible, raising  the piston or the ball toward the top of the column. Hold your breath for 3-5 seconds or for as long as possible. Allow the piston or ball to fall to the bottom of the column. Remove the mouthpiece from your mouth and breathe out normally. Rest for a few seconds and repeat Steps 1 through 7 at least 10 times every 1-2 hours when you are awake. Take your time and take a few normal breaths between deep breaths. The spirometer may include an indicator to show your best effort. Use the indicator as a goal to work toward during each repetition. After each set of 10 deep breaths, practice coughing to be sure your lungs are clear. If you have an incision (the cut made at the time of surgery), support your incision when coughing by placing a pillow or rolled up towels firmly against it. Once you are able to get out of bed, walk around indoors and cough well. You may stop using the incentive spirometer when instructed by your caregiver.  RISKS AND COMPLICATIONS Take your time so you do not get dizzy or light-headed. If you are in pain, you may need to take or ask for pain medication before doing incentive spirometry. It is harder to take a deep breath if you are having pain. AFTER USE Rest and breathe slowly and easily. It can be helpful to keep track of a log of your progress. Your caregiver can provide you with a simple table to help with this. If you are using the spirometer at home, follow these instructions: [place] IF:  You are having difficultly using the spirometer. You have trouble using the spirometer as often as instructed. Your pain medication is not giving enough relief while using the spirometer. You develop fever of 100.5 F (38.1 C) or higher. SEEK IMMEDIATE MEDICAL CARE IF:  You cough up bloody sputum that had not been present before. You develop fever of 102 F (38.9 C) or greater. You develop worsening pain at or near the incision site. MAKE SURE YOU:  Understand these  instructions. Will watch your condition. Will get help right away if you are not doing well or get worse. Document Released: 02/20/2007 Document Revised: 01/02/2012 Document Reviewed: 04/23/2007 [employer] Patient Information  2014 [employer].   ________________________________________________________________________  WHAT IS A BLOOD TRANSFUSION? Blood Transfusion Information  A transfusion is the replacement of blood or some of its parts. Blood is made up of multiple cells which provide different functions. Red blood cells carry oxygen and are used for blood loss replacement. White blood cells fight against infection. Platelets control bleeding. Plasma helps clot blood. Other blood products are available for specialized needs, such as hemophilia or other clotting disorders. BEFORE THE TRANSFUSION  Who gives blood for transfusions?  Healthy volunteers who are fully evaluated to make sure their blood is safe. This is blood bank blood. Transfusion therapy is the safest it has ever been in the practice of medicine. Before blood is taken from a donor, a complete history is taken to make sure that person has no history of diseases nor engages in risky social behavior (examples are intravenous drug use or sexual activity with multiple partners). The donor's travel history is screened to minimize risk of transmitting infections, such as malaria. The donated blood is tested for signs of infectious diseases, such as HIV and hepatitis. The blood is then tested to be sure it is compatible with you in order to minimize the chance of a transfusion reaction. If you or a relative donates blood, this is often done in anticipation of surgery and is not appropriate for emergency situations. It takes many days to process the donated blood. RISKS AND COMPLICATIONS Although transfusion therapy is very safe and saves many lives, the main dangers of transfusion include:  Getting an infectious disease. Developing a  transfusion reaction. This is an allergic reaction to something in the blood you were given. Every precaution is taken to prevent this. The decision to have a blood transfusion has been considered carefully by your caregiver before blood is given. Blood is not given unless the benefits outweigh the risks. AFTER THE TRANSFUSION Right after receiving a blood transfusion, you will usually feel much better and more energetic. This is especially true if your red blood cells have gotten low (anemic). The transfusion raises the level of the red blood cells which carry oxygen, and this usually causes an energy increase. The nurse administering the transfusion will monitor you carefully for complications. HOME CARE INSTRUCTIONS  No special instructions are needed after a transfusion. You may find your energy is better. Speak with your caregiver about any limitations on activity for underlying diseases you may have. SEEK MEDICAL CARE IF:  Your condition is not improving after your transfusion. You develop redness or irritation at the intravenous (IV) site. SEEK IMMEDIATE MEDICAL CARE IF:  Any of the following symptoms occur over the next 12 hours: Shaking chills. You have a temperature by mouth above 102 F (38.9 C), not controlled by medicine. Chest, back, or muscle pain. People around you feel you are not acting correctly or are confused. Shortness of breath or difficulty breathing. Dizziness and fainting. You get a rash or develop hives. You have a decrease in urine output. Your urine turns a dark color or changes to pink, red, or brown. Any of the following symptoms occur over the next 10 days: You have a temperature by mouth above 102 F (38.9 C), not controlled by medicine. Shortness of breath. Weakness after normal activity. The white part of the eye turns yellow (jaundice). You have a decrease in the amount of urine or are urinating less often. Your urine turns a dark color or changes to  pink, red, or brown. Document Released: 10/07/2000  Document Revised: 01/02/2012 Document Reviewed: 05/26/2008 [facility] Patient Information 2014 [place], [place].  _______________________________________________________________________

## 2021-11-04 NOTE — Progress Notes (Addendum)
COVID swab appointment: 11-15-21 @ 8:00 AM  COVID Vaccine Completed:  Yes X2 Date COVID Vaccine completed: Has received booster: COVID vaccine manufacturer: Pfizer      Date of COVID positive in last 90 days:  No  PCP - Arthur Holms, NP Cardiologist -  Charolette Forward,  MD  Last saw 2021 (notes on chart)  Chest x-ray - CT chest 10-21-21 Epic EKG - 11-09-21 Epic Stress Test - greater than 2 years on chart ECHO  - pt unsure if done Cardiac Cath - greater than 2 years on chart Pacemaker/ICD device last checked: Spinal Cord Stimulator:  Sleep Study - yes, +sleep apnea CPAP - No (could not tolerate mask)  Fasting Blood Sugar - N/A Checks Blood Sugar _____ times a day  Blood Thinner Instructions:N/A Aspirin Instructions: Last Dose:  Activity level:   Can go up a flight of stairs and perform activities of daily living without stopping and without symptoms of chest pain or shortness of breath.    Anesthesia review:  CAD, Murmur, HTN , OSA  Patient denies shortness of breath, fever, cough and chest pain at PAT appointment  Patient verbalized understanding of instructions that were given to them at the PAT appointment. Patient was also instructed that they will need to review over the PAT instructions again at home before surgery.

## 2021-11-05 ENCOUNTER — Encounter: Payer: Medicare PPO | Admitting: Emergency Medicine

## 2021-11-09 ENCOUNTER — Ambulatory Visit (HOSPITAL_COMMUNITY)
Admission: RE | Admit: 2021-11-09 | Discharge: 2021-11-09 | Disposition: A | Discharge status: Home / Self Care | Payer: Medicare PPO | Source: Ambulatory Visit | Attending: Physician Assistant | Admitting: Physician Assistant

## 2021-11-09 ENCOUNTER — Encounter (HOSPITAL_COMMUNITY)
Admission: RE | Admit: 2021-11-09 | Discharge: 2021-11-09 | Disposition: A | Discharge status: Home / Self Care | Payer: Medicare PPO | Source: Ambulatory Visit | Attending: General Surgery | Admitting: General Surgery

## 2021-11-09 ENCOUNTER — Encounter (HOSPITAL_COMMUNITY): Payer: Self-pay

## 2021-11-09 ENCOUNTER — Other Ambulatory Visit: Payer: Self-pay

## 2021-11-09 VITALS — BP 104/66 | HR 59 | Temp 98.3°F | Resp 20 | Ht 69.5 in | Wt 217.8 lb

## 2021-11-09 DIAGNOSIS — I2510 Atherosclerotic heart disease of native coronary artery without angina pectoris: Secondary | ICD-10-CM | POA: Insufficient documentation

## 2021-11-09 DIAGNOSIS — Z01818 Encounter for other preprocedural examination: Secondary | ICD-10-CM | POA: Insufficient documentation

## 2021-11-09 DIAGNOSIS — N289 Disorder of kidney and ureter, unspecified: Secondary | ICD-10-CM | POA: Diagnosis present

## 2021-11-09 DIAGNOSIS — R590 Localized enlarged lymph nodes: Secondary | ICD-10-CM | POA: Insufficient documentation

## 2021-11-09 DIAGNOSIS — C19 Malignant neoplasm of rectosigmoid junction: Secondary | ICD-10-CM | POA: Insufficient documentation

## 2021-11-09 HISTORY — DX: Atherosclerotic heart disease of native coronary artery without angina pectoris: I25.10

## 2021-11-09 HISTORY — DX: Pneumonia, unspecified organism: J18.9

## 2021-11-09 HISTORY — DX: Malignant neoplasm of colon, unspecified: C18.9

## 2021-11-09 LAB — BASIC METABOLIC PANEL
Anion gap: 5 (ref 5–15)
BUN: 17 mg/dL (ref 8–23)
CO2: 28 mmol/L (ref 22–32)
Calcium: 8.9 mg/dL (ref 8.9–10.3)
Chloride: 101 mmol/L (ref 98–111)
Creatinine, Ser: 1.39 mg/dL — ABNORMAL HIGH (ref 0.61–1.24)
GFR, Estimated: 55 mL/min — ABNORMAL LOW (ref >60)
Glucose, Bld: 109 mg/dL — ABNORMAL HIGH (ref 70–99)
Potassium: 3.7 mmol/L (ref 3.5–5.1)
Sodium: 134 mmol/L — ABNORMAL LOW (ref 135–145)

## 2021-11-09 LAB — CBC
HCT: 34.6 % — ABNORMAL LOW (ref 39.0–52.0)
Hemoglobin: 10.5 g/dL — ABNORMAL LOW (ref 13.0–17.0)
MCH: 27 pg (ref 26.0–34.0)
MCHC: 30.3 g/dL (ref 30.0–36.0)
MCV: 88.9 fL (ref 80.0–100.0)
Platelets: 262 10*3/uL (ref 150–400)
RBC: 3.89 MIL/uL — ABNORMAL LOW (ref 4.22–5.81)
RDW: 13.8 % (ref 11.5–15.5)
WBC: 7.5 10*3/uL (ref 4.0–10.5)
nRBC: 0 % (ref 0.0–0.2)

## 2021-11-09 LAB — GLUCOSE, CAPILLARY: Glucose-Capillary: 112 mg/dL — ABNORMAL HIGH (ref 70–99)

## 2021-11-09 MED ORDER — FLUDEOXYGLUCOSE F - 18 (FDG) INJECTION
11.0000 | Freq: Once | INTRAVENOUS | Status: AC | PRN
  Administered 2021-11-09: 10.66 via INTRAVENOUS

## 2021-11-11 ENCOUNTER — Inpatient Hospital Stay (HOSPITAL_BASED_OUTPATIENT_CLINIC_OR_DEPARTMENT_OTHER): Payer: Medicare PPO | Admitting: Hematology

## 2021-11-11 DIAGNOSIS — C19 Malignant neoplasm of rectosigmoid junction: Secondary | ICD-10-CM

## 2021-11-11 NOTE — Progress Notes (Signed)
[place]   [phone] [phone]   Clinic Follow up Note   Patient Care Team: Arthur Holms, NP as PCP - General (Nurse Practitioner)  Date of Service:  11/11/2021  I connected with Cinda Quest on 11/11/2021 at  4:00 PM EST by telephone visit and verified that I am speaking with the correct person using two identifiers.  I discussed the limitations, risks, security and privacy concerns of performing an evaluation and management service by telephone and the availability of in person appointments. I also discussed with the patient that there may be a patient responsible charge related to this service. The patient expressed understanding and agreed to proceed.   Other persons participating in the visit and their role in the encounter:  none  Patient's location:  home Provider's location:  my office  CHIEF COMPLAINT: f/u of colorectal cancer  CURRENT THERAPY:  Pending Surgery 11/17/21  ASSESSMENT & PLAN:  George Watkins is a 71 y.o. male with   1. Colorectal cancer, adenocarcinoma of the ascending colon.  Diagnosed in December 2022.  -Colonoscopy performed on 10/13/21 for IDA and abdominal pain showed a 4 cm mass near the hepatic flexure. Pathology confirmed adenocarcinoma. -Baseline CEA was elevated at 25.9 on 10/13/21 -CT C/A/P on 10/21/21 showed: circumferential mass in the mid ascending colon; mildly prominent, asymmetric right external iliac lymph node, nonspecific but suspicious.   -MMR requested on biopsy sample was normal. -PET scan on 11/09/21 showed: hypermetabolism to colon mass and adjacent mildly enlarged ileocolonic mesenteric lymph nodes. Otherwise, no definitive evidence of additional spread. I personally reviewed the images and discussed the results with him today. I recommend following up his left axillary adenopathy with CT scan in 3-6 months, I do not have high suspicion for metastatic adenopathy and I do not think he needs a biopsy  at this point. -he is scheduled for partial colectomy on 11/17/21 with Dr. Marcello Moores. -If his surgical pathology showed positive lymph nodes, or T4 lesion, I would recommend adjuvant chemotherapy -I will see him back in 3-4 weeks, after his surgery.   2.  Abdominal discomfort and weight loss -Patient reports generalized abdominal pain, intermittent, and achy. Rates pain 5 out of 10. Resolve spontaneously, has not required pain medication thus far.  -will hold on nutrition referral for now.   3.  Iron deficiency anemia  -Patient currently taking ferrous sulfate BID and compliant.  Had good response to oral iron supplement.  Has not required iron infusion or blood transfusion.  -Last hemoglobin on 11/09/21 was 10.5    4. Renal Lesion -incidental finding on CT CAP 10/21/21 -PET on 11/09/21 showed no metabolic activity, favored to reflect hemorrhagic/proteinaceous cysts. Plan to get renal US after his surgery     PLAN: -surgery on 11/17/21 with Dr. Marcello Moores -lab and f/u in 3-4 weeks to review his surgical pathology, and determine if he needs adjuvant chemotherapy -We will order renal ultrasound next visit.   No problem-specific Assessment & Plan notes found for this encounter.   SUMMARY OF ONCOLOGIC HISTORY: Oncology History  Colorectal cancer ([place])  10/13/2021 Procedure   Upper GI endoscopy by Dr. Ardis Hughs for IDA and abdominal pain  Findings:  - The esophagus was normal. - The stomach was normal. - The examined duodenum was normal.   10/13/2021 Procedure   Colonoscopy Indications: Iron deficiency  - A fungating and ulcerated partially obstructing, clearly malignant mass was found at the hepatic flexure. The mass was circumferential and measured 4cm  in length. Biopsies were taken with a cold forceps for histology and then the mucosa just distal to the mass was tattooed with an injection of Spot (carbon black). jar 1 Findings: - Two pedunculated polyps were found in the mid transverse  colon. The polyps were 8 to 11 mm in size. These polyps were removed with a hot snare. Resection and retrieval were complete. jar 2 - Five pedunculated and sessile polyps were found in the distal transverse colon. The polyps were 4 to 14 mm in size. Two were removed with hot snare, three were removed with cold snare. Resection and retrieval were complete. jar 3 - A 20 mm polyp was found in the distal transverse colon. The polyp was pedunculated. The polyp was removed with a hot snare. Resection and retrieval were complete. jar 4 - The exam was otherwise without abnormality on direct and retroflexion views.   10/13/2021 Pathology Results   REPORT OF SURGICAL PATHOLOGY Accession: [id number]  Diagnosis 1. Hepatic Flexure Biopsy - ADENOCARCINOMA, SEE NOTE   10/21/2021 Imaging   CT CHEST ABDOMEN PELVIS W CONTRAST  IMPRESSION: 1. There is a circumferential mass of the mid ascending colon measuring approximately 5 cm in length, in keeping with newly diagnosed primary colon malignancy. 2. Adjacent to the colon mass, there is mesenteric fat stranding suggesting invasion beyond the muscularis as well as abnormally enlarged mesocolonic lymph nodes. 3. Mildly prominent, asymmetric right external iliac lymph nodes, nonspecific although suspicious for early nodal metastatic disease. 4. Asymmetrically prominent left axillary lymph nodes, nonspecific, these nodes less favored to reflect metastatic disease given location in the absence of other evidence of metastatic disease above the diaphragm. Both external iliac and axillary lymph nodes could be characterized for abnormal metabolic activity by PET-CT. 5. No evidence of organ metastatic disease in the chest, abdomen, or pelvis. 6. Exophytic soft tissue attenuation lesion of the anterior midportion of the left kidney measuring 1.2 cm. Partially exophytic soft tissue attenuation lesion of the posteroinferior pole of the left kidney measuring  1.9 x 1.7 cm. These may reflect hemorrhagic or proteinaceous cysts but are incompletely characterized and incidental renal cell carcinoma is not excluded. Recommend initial renal ultrasound to establish definitively cystic character, multiphasic contrast enhanced CT or MRI may however be further required to exclude malignancy. 7. Prostatomegaly with thickening of the decompressed urinary bladder wall, likely due to chronic outlet obstruction.   Aortic Atherosclerosis (ICD10-I70.0).   10/28/2021 Initial Diagnosis   Colorectal cancer ([place])      INTERVAL HISTORY:  SHAMARR FAUCETT was contacted for a follow up of colorectal cancer. He was last seen by me on 10/29/21 in consultation with PA Cassie.    All other systems were reviewed with the patient and are negative.  MEDICAL HISTORY:  Past Medical History:  Diagnosis Date   Anemia    Arthritis    Colon cancer ([place])    Coronary artery disease    GERD (gastroesophageal reflux disease)    Heart murmur    Hiatal hernia    Hypertension    Pneumonia    Sleep apnea     SURGICAL HISTORY: Past Surgical History:  Procedure Laterality Date   ANKLE SURGERY     CIRCUMCISION     COLONOSCOPY     HERNIA REPAIR      I have reviewed the social history and family history with the patient and they are unchanged from previous note.  ALLERGIES:  is allergic to sulfa antibiotics.  MEDICATIONS:  Current Outpatient  Medications  Medication Sig Dispense Refill   amLODipine (NORVASC) 5 MG tablet Take 5 mg by mouth daily.     atorvastatin (LIPITOR) 10 MG tablet Take 10 mg by mouth at bedtime.     cholecalciferol (VITAMIN D3) 25 MCG (1000 UNIT) tablet Take 1,000 Units by mouth daily.     cloNIDine (CATAPRES) 0.2 MG tablet Take 0.2 mg by mouth 2 (two) times daily.     cyclobenzaprine (FLEXERIL) 5 MG tablet Take 5 mg by mouth at bedtime as needed.     ferrous sulfate 325 (65 FE) MG tablet Take 1 tablet (325 mg total) by mouth 2 (two) times daily  with a meal. 30 tablet 0   labetalol (NORMODYNE) 200 MG tablet Take 200 mg by mouth 2 (two) times daily.     losartan-hydrochlorothiazide (HYZAAR) 100-25 MG tablet Take 1 tablet by mouth daily.     Multiple Vitamins-Minerals (MULTIVITAMIN MEN 50+ PO) Take 1 tablet by mouth daily.     pantoprazole (PROTONIX) 40 MG tablet Take 40 mg by mouth daily.     triamcinolone ointment (KENALOG) 0.1 % Apply 1 application topically daily as needed (irritation).     No current facility-administered medications for this visit.    PHYSICAL EXAMINATION: ECOG PERFORMANCE STATUS: 1 - Symptomatic but completely ambulatory  There were no vitals filed for this visit. Wt Readings from Last 3 Encounters:  11/09/21 217 lb 12.8 oz (98.8 kg)  10/29/21 220 lb 1.6 oz (99.8 kg)  10/13/21 225 lb (102.1 kg)     No vitals taken today, Exam not performed today  LABORATORY DATA:  I have reviewed the data as listed CBC Latest Ref Rng & Units 11/09/2021 10/13/2021 08/25/2021  WBC 4.0 - 10.5 K/uL 7.5 6.3 7.3  Hemoglobin 13.0 - 17.0 g/dL 10.5(L) 11.2(L) 12.1(L)  Hematocrit 39.0 - 52.0 % 34.6(L) 35.8(L) 38.4(L)  Platelets 150 - 400 K/uL 262 216.0 240.0     CMP Latest Ref Rng & Units 11/09/2021 10/13/2021 10/07/2020  Glucose 70 - 99 mg/dL 109(H) 95 112(H)  BUN 8 - 23 mg/dL 17 14 9   Creatinine 0.61 - 1.24 mg/dL 1.39(H) 1.30 1.20  Sodium 135 - 145 mmol/L 134(L) 138 138  Potassium 3.5 - 5.1 mmol/L 3.7 3.4(L) 3.7  Chloride 98 - 111 mmol/L 101 99 101  CO2 22 - 32 mmol/L 28 33(H) 32  Calcium 8.9 - 10.3 mg/dL 8.9 8.9 9.6  Total Protein 6.0 - 8.3 g/dL - 7.1 7.8  Total Bilirubin 0.2 - 1.2 mg/dL - 1.2 0.4  Alkaline Phos 39 - 117 U/L - 50 67  AST 0 - 37 U/L - 24 24  ALT 0 - 53 U/L - 31 27      RADIOGRAPHIC STUDIES: I have personally reviewed the radiological images as listed and agreed with the findings in the report. NM PET Image Initial (PI) Skull Base To Thigh  Result Date: 11/10/2021 CLINICAL DATA:  Initial  treatment strategy for metastatic colon cancer. Indeterminate renal lesion. EXAM: NUCLEAR MEDICINE PET SKULL BASE TO THIGH TECHNIQUE: 10.66 mCi F-18 FDG was injected intravenously. Full-ring PET imaging was performed from the skull base to thigh after the radiotracer. CT data was obtained and used for attenuation correction and anatomic localization. Fasting blood glucose: 112 mg/dl COMPARISON:  CTs of the chest, abdomen and pelvis 10/21/2021 FINDINGS: Mediastinal blood pool activity: SUV max 2.5 NECK: There are several small mildly hypermetabolic lower cervical lymph nodes bilaterally demonstrating an SUV max of up to 4.5 on the left.  These lymph nodes are not enlarged.There are no lesions of the pharyngeal mucosal space. Symmetric activity within the lymphoid tissue of Waldeyer's ring is within physiologic limits. Incidental CT findings: Bilateral carotid atherosclerosis. CHEST: There are small hypermetabolic left axillary, subpectoral and supraclavicular lymph nodes. A in 8 mm left supraclavicular node on image 40/4 has an SUV max of 4.5. There is a 7 mm left axillary node on image 59/4 with an SUV max of 6.4. These nodes are similar in size to the recent CT. No other hypermetabolic mediastinal or hilar lymph nodes identified. No hypermetabolic pulmonary activity or suspicious nodularity. Incidental CT findings: Mild aortic and great vessel atherosclerosis. ABDOMEN/PELVIS: There is no hypermetabolic activity within the liver, adrenal glands, spleen or pancreas. There is focal hypermetabolic activity within the ascending colon associated with the irregular circumferential mass demonstrated previously. This has an SUV max of 9.9. Adjacent ileocolonic mesenteric lymph nodes are hypermetabolic, largest measuring 12 mm on image 123/4 (SUV max 2.8). No other hypermetabolic lymph nodes in the abdomen or pelvis. Specifically, the mildly prominent external iliac lymph nodes do not show any increased metabolic activity.  The previously demonstrated hyperdense renal lesions bilaterally do not demonstrate any metabolic activity and are likely hemorrhagic/proteinaceous cysts, although are suboptimally evaluated by PET-CT. Incidental CT findings: As above, hyperdense renal lesions bilaterally, unchanged from recent CT. Largest posteriorly in the lower pole of the left kidney measures 2.2 cm on image 127/4 and has a nonspecific density of 49 HU. Mild aortic and branch vessel atherosclerosis. SKELETON: There is no hypermetabolic activity to suggest osseous metastatic disease. Incidental CT findings: none IMPRESSION: 1. The known colon cancer involving the ascending colon is hypermetabolic, as well as the adjacent mildly enlarged ileocolonic mesenteric lymph nodes, consistent with nodal metastases. 2. No evidence of metastatic disease to the liver. 3. Small lower cervical, left subpectoral and left axillary lymph nodes are mildly hypermetabolic. These lymph nodes are nonspecific, and would be unlikely for metastatic colon cancer. The may be reactive. Attention on follow-up recommended. 4. Bilateral hyperdense renal lesions do not show any metabolic activity, and are favored to reflect hemorrhagic/proteinaceous cysts. Renal lesions are suboptimally evaluated by PET-CT. Management options include further evaluation with dedicated renal MRI (pre and post-contrast imaging) or inclusion of pre contrast images of the kidneys on CT follow-up within 6 months. Electronically Signed   By: Richardean Sale M.D.   On: 11/10/2021 10:02      No orders of the defined types were placed in this encounter.  All questions were answered. The patient knows to call the clinic with any problems, questions or concerns. No barriers to learning was detected. The total time spent in the appointment was 15 minutes.     Truitt Merle, MD 11/11/2021   I, Wilburn Mylar, am acting as scribe for Truitt Merle, MD.   I have reviewed the above documentation for  accuracy and completeness, and I agree with the above.

## 2021-11-14 ENCOUNTER — Encounter: Payer: Self-pay | Admitting: Hematology

## 2021-11-15 ENCOUNTER — Telehealth: Payer: Self-pay | Admitting: Hematology

## 2021-11-15 ENCOUNTER — Encounter (HOSPITAL_COMMUNITY)
Admission: RE | Admit: 2021-11-15 | Discharge: 2021-11-15 | Disposition: A | Discharge status: Home / Self Care | Payer: Medicare PPO | Source: Ambulatory Visit | Attending: General Surgery | Admitting: General Surgery

## 2021-11-15 ENCOUNTER — Other Ambulatory Visit: Payer: Self-pay

## 2021-11-15 DIAGNOSIS — Z01812 Encounter for preprocedural laboratory examination: Secondary | ICD-10-CM | POA: Insufficient documentation

## 2021-11-15 DIAGNOSIS — Z20822 Contact with and (suspected) exposure to covid-19: Secondary | ICD-10-CM | POA: Insufficient documentation

## 2021-11-15 DIAGNOSIS — Z01818 Encounter for other preprocedural examination: Secondary | ICD-10-CM

## 2021-11-15 LAB — SARS CORONAVIRUS 2 (TAT 6-24 HRS): SARS Coronavirus 2: NEGATIVE

## 2021-11-15 NOTE — Telephone Encounter (Signed)
Left message with follow-up appointment per 1/19 los.

## 2021-11-16 NOTE — Anesthesia Preprocedure Evaluation (Addendum)
Anesthesia Evaluation  Patient identified by MRN, date of birth, ID band Patient awake    Reviewed: Allergy & Precautions, NPO status , Patient's Chart, lab work & pertinent test results, reviewed documented beta blocker date and time   History of Anesthesia Complications Negative for: history of anesthetic complications  Airway Mallampati: II  TM Distance: >3 FB Neck ROM: Full    Dental  (+) Missing,    Pulmonary sleep apnea , Current Smoker and Patient abstained from smoking.,    Pulmonary exam normal        Cardiovascular hypertension, Pt. on medications and Pt. on home beta blockers + CAD  Normal cardiovascular exam     Neuro/Psych negative neurological ROS  negative psych ROS   GI/Hepatic Neg liver ROS, hiatal hernia, GERD  Medicated and Controlled,Colon ca   Endo/Other  negative endocrine ROS  Renal/GU Renal InsufficiencyRenal disease (Cr 1.39)  negative genitourinary   Musculoskeletal  (+) Arthritis ,   Abdominal   Peds  Hematology  (+) anemia , Hgb 10.5   Anesthesia Other Findings Day of surgery medications reviewed with patient.  Reproductive/Obstetrics negative OB ROS                            Anesthesia Physical Anesthesia Plan  ASA: 2  Anesthesia Plan: General   Post-op Pain Management: Ofirmev IV (intra-op), Ketamine IV and Lidocaine infusion   Induction: Intravenous  PONV Risk Score and Plan: 2 and Treatment may vary due to age or medical condition, Ondansetron, Dexamethasone and Midazolam  Airway Management Planned: Oral ETT  Additional Equipment: None  Intra-op Plan:   Post-operative Plan: Extubation in OR  Informed Consent: I have reviewed the patients History and Physical, chart, labs and discussed the procedure including the risks, benefits and alternatives for the proposed anesthesia with the patient or authorized representative who has indicated his/her  understanding and acceptance.     Dental advisory given  Plan Discussed with: CRNA  Anesthesia Plan Comments:        Anesthesia Quick Evaluation

## 2021-11-17 ENCOUNTER — Encounter (HOSPITAL_COMMUNITY)
Admission: RE | Disposition: A | Discharge status: Home / Self Care | Payer: Self-pay | Source: Home / Self Care | Attending: General Surgery

## 2021-11-17 ENCOUNTER — Inpatient Hospital Stay (HOSPITAL_COMMUNITY): Payer: Medicare PPO | Admitting: Physician Assistant

## 2021-11-17 ENCOUNTER — Encounter (HOSPITAL_COMMUNITY): Payer: Self-pay | Admitting: General Surgery

## 2021-11-17 ENCOUNTER — Other Ambulatory Visit: Payer: Self-pay

## 2021-11-17 ENCOUNTER — Inpatient Hospital Stay (HOSPITAL_COMMUNITY)
Admission: RE | Admit: 2021-11-17 | Discharge: 2021-11-19 | DRG: 330 | Disposition: A | Discharge status: Home / Self Care | Payer: Medicare PPO | Attending: General Surgery | Admitting: General Surgery

## 2021-11-17 ENCOUNTER — Inpatient Hospital Stay (HOSPITAL_COMMUNITY): Payer: Medicare PPO | Admitting: Anesthesiology

## 2021-11-17 DIAGNOSIS — I10 Essential (primary) hypertension: Secondary | ICD-10-CM | POA: Diagnosis present

## 2021-11-17 DIAGNOSIS — Z20822 Contact with and (suspected) exposure to covid-19: Secondary | ICD-10-CM | POA: Diagnosis present

## 2021-11-17 DIAGNOSIS — C189 Malignant neoplasm of colon, unspecified: Secondary | ICD-10-CM

## 2021-11-17 DIAGNOSIS — Z8249 Family history of ischemic heart disease and other diseases of the circulatory system: Secondary | ICD-10-CM | POA: Diagnosis not present

## 2021-11-17 DIAGNOSIS — Z79899 Other long term (current) drug therapy: Secondary | ICD-10-CM

## 2021-11-17 DIAGNOSIS — F17210 Nicotine dependence, cigarettes, uncomplicated: Secondary | ICD-10-CM | POA: Diagnosis present

## 2021-11-17 DIAGNOSIS — D509 Iron deficiency anemia, unspecified: Secondary | ICD-10-CM | POA: Diagnosis present

## 2021-11-17 DIAGNOSIS — K219 Gastro-esophageal reflux disease without esophagitis: Secondary | ICD-10-CM | POA: Diagnosis present

## 2021-11-17 DIAGNOSIS — C772 Secondary and unspecified malignant neoplasm of intra-abdominal lymph nodes: Secondary | ICD-10-CM | POA: Diagnosis present

## 2021-11-17 DIAGNOSIS — C182 Malignant neoplasm of ascending colon: Principal | ICD-10-CM | POA: Diagnosis present

## 2021-11-17 DIAGNOSIS — K449 Diaphragmatic hernia without obstruction or gangrene: Secondary | ICD-10-CM | POA: Diagnosis present

## 2021-11-17 DIAGNOSIS — M1990 Unspecified osteoarthritis, unspecified site: Secondary | ICD-10-CM | POA: Diagnosis present

## 2021-11-17 DIAGNOSIS — G4730 Sleep apnea, unspecified: Secondary | ICD-10-CM | POA: Diagnosis present

## 2021-11-17 LAB — ABO/RH: ABO/RH(D): B NEG

## 2021-11-17 LAB — TYPE AND SCREEN
ABO/RH(D): B NEG
Antibody Screen: NEGATIVE

## 2021-11-17 SURGERY — COLECTOMY, PARTIAL, ROBOT-ASSISTED, LAPAROSCOPIC
Anesthesia: General

## 2021-11-17 MED ORDER — ONDANSETRON HCL 4 MG/2ML IJ SOLN: 4.0000 mg | Freq: Four times a day (QID) | INTRAMUSCULAR | Status: DC | PRN

## 2021-11-17 MED ORDER — TRAMADOL HCL 50 MG PO TABS
50.0000 mg | ORAL_TABLET | Freq: Four times a day (QID) | ORAL | Status: DC | PRN
  Administered 2021-11-17 – 2021-11-19 (×3): 100 mg via ORAL
  Filled 2021-11-17 (×3): qty 2

## 2021-11-17 MED ORDER — BUPIVACAINE LIPOSOME 1.3 % IJ SUSP
INTRAMUSCULAR | Status: DC | PRN
  Administered 2021-11-17: 20 mL

## 2021-11-17 MED ORDER — AMLODIPINE BESYLATE 5 MG PO TABS
5.0000 mg | ORAL_TABLET | Freq: Every day | ORAL | Status: DC
  Administered 2021-11-18 – 2021-11-19 (×2): 5 mg via ORAL
  Filled 2021-11-17 (×2): qty 1

## 2021-11-17 MED ORDER — HYDROMORPHONE HCL 1 MG/ML IJ SOLN: 0.5000 mg | INTRAMUSCULAR | Status: DC | PRN

## 2021-11-17 MED ORDER — SODIUM CHLORIDE 0.9 % IV SOLN
2.0000 g | INTRAVENOUS | Status: AC
  Administered 2021-11-17: 09:00:00 2 g via INTRAVENOUS
  Filled 2021-11-17: qty 2

## 2021-11-17 MED ORDER — GABAPENTIN 300 MG PO CAPS
300.0000 mg | ORAL_CAPSULE | Freq: Two times a day (BID) | ORAL | Status: DC
  Administered 2021-11-17 – 2021-11-19 (×4): 300 mg via ORAL
  Filled 2021-11-17 (×4): qty 1

## 2021-11-17 MED ORDER — ONDANSETRON HCL 4 MG/2ML IJ SOLN
INTRAMUSCULAR | Status: DC | PRN
Start: 2021-11-17 — End: 2021-11-17
  Administered 2021-11-17: 4 mg via INTRAVENOUS

## 2021-11-17 MED ORDER — ENOXAPARIN SODIUM 40 MG/0.4ML IJ SOSY
40.0000 mg | PREFILLED_SYRINGE | INTRAMUSCULAR | Status: DC
  Administered 2021-11-18 – 2021-11-19 (×2): 40 mg via SUBCUTANEOUS
  Filled 2021-11-17 (×2): qty 0.4

## 2021-11-17 MED ORDER — PANTOPRAZOLE SODIUM 40 MG PO TBEC
40.0000 mg | DELAYED_RELEASE_TABLET | Freq: Every day | ORAL | Status: DC
  Administered 2021-11-17 – 2021-11-19 (×3): 40 mg via ORAL
  Filled 2021-11-17 (×3): qty 1

## 2021-11-17 MED ORDER — HYDROMORPHONE HCL 1 MG/ML IJ SOLN
INTRAMUSCULAR | Status: AC
  Administered 2021-11-17: 12:00:00 0.5 mg via INTRAVENOUS
  Filled 2021-11-17: qty 2

## 2021-11-17 MED ORDER — PROPOFOL 10 MG/ML IV BOLUS
INTRAVENOUS | Status: DC | PRN
Start: 2021-11-17 — End: 2021-11-17
  Administered 2021-11-17: 180 mg via INTRAVENOUS

## 2021-11-17 MED ORDER — ROCURONIUM BROMIDE 10 MG/ML (PF) SYRINGE
PREFILLED_SYRINGE | INTRAVENOUS | Status: DC | PRN
  Administered 2021-11-17: 10 mg via INTRAVENOUS
  Administered 2021-11-17: 60 mg via INTRAVENOUS
  Administered 2021-11-17: 30 mg via INTRAVENOUS

## 2021-11-17 MED ORDER — ONDANSETRON HCL 4 MG PO TABS: 4.0000 mg | ORAL_TABLET | Freq: Four times a day (QID) | ORAL | Status: DC | PRN

## 2021-11-17 MED ORDER — FENTANYL CITRATE (PF) 250 MCG/5ML IJ SOLN
INTRAMUSCULAR | Status: DC | PRN
  Administered 2021-11-17: 100 ug via INTRAVENOUS

## 2021-11-17 MED ORDER — ROCURONIUM BROMIDE 10 MG/ML (PF) SYRINGE
PREFILLED_SYRINGE | INTRAVENOUS | Status: AC
  Filled 2021-11-17: qty 10

## 2021-11-17 MED ORDER — ONDANSETRON HCL 4 MG/2ML IJ SOLN
INTRAMUSCULAR | Status: AC
  Filled 2021-11-17: qty 2

## 2021-11-17 MED ORDER — HYDROCHLOROTHIAZIDE 25 MG PO TABS
25.0000 mg | ORAL_TABLET | Freq: Every day | ORAL | Status: DC
  Administered 2021-11-18 – 2021-11-19 (×2): 25 mg via ORAL
  Filled 2021-11-17 (×2): qty 1

## 2021-11-17 MED ORDER — HYDROMORPHONE HCL 1 MG/ML IJ SOLN
0.2500 mg | INTRAMUSCULAR | Status: DC | PRN
  Administered 2021-11-17: 12:00:00 0.5 mg via INTRAVENOUS

## 2021-11-17 MED ORDER — ALVIMOPAN 12 MG PO CAPS
12.0000 mg | ORAL_CAPSULE | Freq: Two times a day (BID) | ORAL | Status: DC
  Filled 2021-11-17: qty 1

## 2021-11-17 MED ORDER — ALUM & MAG HYDROXIDE-SIMETH 200-200-20 MG/5ML PO SUSP: 30.0000 mL | Freq: Four times a day (QID) | ORAL | Status: DC | PRN

## 2021-11-17 MED ORDER — SUGAMMADEX SODIUM 200 MG/2ML IV SOLN
INTRAVENOUS | Status: DC | PRN
  Administered 2021-11-17: 200 mg via INTRAVENOUS

## 2021-11-17 MED ORDER — LIDOCAINE 2% (20 MG/ML) 5 ML SYRINGE
INTRAMUSCULAR | Status: DC | PRN
  Administered 2021-11-17: 09:00:00 1.5 mg/kg/h via INTRAVENOUS

## 2021-11-17 MED ORDER — KETAMINE HCL 10 MG/ML IJ SOLN
INTRAMUSCULAR | Status: DC | PRN
Start: 2021-11-17 — End: 2021-11-17
  Administered 2021-11-17: 40 mg via INTRAVENOUS

## 2021-11-17 MED ORDER — ACETAMINOPHEN 500 MG PO TABS
1000.0000 mg | ORAL_TABLET | ORAL | Status: AC
  Administered 2021-11-17: 07:00:00 1000 mg via ORAL
  Filled 2021-11-17: qty 2

## 2021-11-17 MED ORDER — SIMETHICONE 80 MG PO CHEW: 40.0000 mg | CHEWABLE_TABLET | Freq: Four times a day (QID) | ORAL | Status: DC | PRN

## 2021-11-17 MED ORDER — FENTANYL CITRATE (PF) 100 MCG/2ML IJ SOLN
INTRAMUSCULAR | Status: AC
  Filled 2021-11-17: qty 2

## 2021-11-17 MED ORDER — LOSARTAN POTASSIUM 50 MG PO TABS
100.0000 mg | ORAL_TABLET | Freq: Every day | ORAL | Status: DC
  Administered 2021-11-18 – 2021-11-19 (×2): 100 mg via ORAL
  Filled 2021-11-17 (×2): qty 2

## 2021-11-17 MED ORDER — BUPIVACAINE LIPOSOME 1.3 % IJ SUSP: 20.0000 mL | Freq: Once | INTRAMUSCULAR | Status: DC

## 2021-11-17 MED ORDER — EPHEDRINE SULFATE-NACL 50-0.9 MG/10ML-% IV SOSY
PREFILLED_SYRINGE | INTRAVENOUS | Status: DC | PRN
  Administered 2021-11-17: 10 mg via INTRAVENOUS
  Administered 2021-11-17: 5 mg via INTRAVENOUS
  Administered 2021-11-17: 10 mg via INTRAVENOUS

## 2021-11-17 MED ORDER — GABAPENTIN 300 MG PO CAPS
300.0000 mg | ORAL_CAPSULE | ORAL | Status: AC
  Administered 2021-11-17: 07:00:00 300 mg via ORAL
  Filled 2021-11-17: qty 1

## 2021-11-17 MED ORDER — PHENYLEPHRINE HCL (PRESSORS) 10 MG/ML IV SOLN
INTRAVENOUS | Status: AC
  Filled 2021-11-17: qty 1

## 2021-11-17 MED ORDER — PHENYLEPHRINE HCL-NACL 20-0.9 MG/250ML-% IV SOLN
INTRAVENOUS | Status: DC | PRN
Start: 2021-11-17 — End: 2021-11-17
  Administered 2021-11-17: 40 ug/min via INTRAVENOUS

## 2021-11-17 MED ORDER — GLYCOPYRROLATE 0.2 MG/ML IJ SOLN
INTRAMUSCULAR | Status: DC | PRN
  Administered 2021-11-17 (×2): .1 mg via INTRAVENOUS

## 2021-11-17 MED ORDER — RINGERS IRRIGATION IR SOLN
Status: DC | PRN
  Administered 2021-11-17: 1000 mL

## 2021-11-17 MED ORDER — 0.9 % SODIUM CHLORIDE (POUR BTL) OPTIME
TOPICAL | Status: DC | PRN
Start: 2021-11-17 — End: 2021-11-17
  Administered 2021-11-17: 10:00:00 1000 mL

## 2021-11-17 MED ORDER — ACETAMINOPHEN 500 MG PO TABS
1000.0000 mg | ORAL_TABLET | Freq: Four times a day (QID) | ORAL | Status: DC
  Administered 2021-11-17 – 2021-11-19 (×7): 1000 mg via ORAL
  Filled 2021-11-17 (×7): qty 2

## 2021-11-17 MED ORDER — SACCHAROMYCES BOULARDII 250 MG PO CAPS
250.0000 mg | ORAL_CAPSULE | Freq: Two times a day (BID) | ORAL | Status: DC
  Administered 2021-11-17 – 2021-11-19 (×4): 250 mg via ORAL
  Filled 2021-11-17 (×4): qty 1

## 2021-11-17 MED ORDER — GLYCOPYRROLATE 0.2 MG/ML IJ SOLN
INTRAMUSCULAR | Status: AC
  Filled 2021-11-17: qty 1

## 2021-11-17 MED ORDER — BUPIVACAINE-EPINEPHRINE 0.25% -1:200000 IJ SOLN
INTRAMUSCULAR | Status: DC | PRN
  Administered 2021-11-17: 30 mL

## 2021-11-17 MED ORDER — LABETALOL HCL 100 MG PO TABS
200.0000 mg | ORAL_TABLET | Freq: Two times a day (BID) | ORAL | Status: DC
  Administered 2021-11-17 – 2021-11-18 (×2): 200 mg via ORAL
  Filled 2021-11-17 (×4): qty 2

## 2021-11-17 MED ORDER — HEPARIN SODIUM (PORCINE) 5000 UNIT/ML IJ SOLN
5000.0000 [IU] | Freq: Once | INTRAMUSCULAR | Status: AC
  Administered 2021-11-17: 07:00:00 5000 [IU] via SUBCUTANEOUS
  Filled 2021-11-17: qty 1

## 2021-11-17 MED ORDER — CHLORHEXIDINE GLUCONATE 0.12 % MT SOLN
15.0000 mL | Freq: Once | OROMUCOSAL | Status: AC
  Administered 2021-11-17: 07:00:00 15 mL via OROMUCOSAL

## 2021-11-17 MED ORDER — KCL IN DEXTROSE-NACL 20-5-0.45 MEQ/L-%-% IV SOLN
INTRAVENOUS | Status: DC
  Filled 2021-11-17: qty 1000

## 2021-11-17 MED ORDER — STERILE WATER FOR IRRIGATION IR SOLN
Status: DC | PRN
Start: 2021-11-17 — End: 2021-11-17
  Administered 2021-11-17: 1000 mL

## 2021-11-17 MED ORDER — DROPERIDOL 2.5 MG/ML IJ SOLN: 0.6250 mg | Freq: Once | INTRAMUSCULAR | Status: DC | PRN

## 2021-11-17 MED ORDER — CYCLOBENZAPRINE HCL 5 MG PO TABS
5.0000 mg | ORAL_TABLET | Freq: Every evening | ORAL | Status: DC | PRN
  Administered 2021-11-17 – 2021-11-18 (×2): 5 mg via ORAL
  Filled 2021-11-17 (×2): qty 1

## 2021-11-17 MED ORDER — LIDOCAINE HCL (PF) 2 % IJ SOLN
INTRAMUSCULAR | Status: AC
  Filled 2021-11-17: qty 5

## 2021-11-17 MED ORDER — PHENYLEPHRINE 40 MCG/ML (10ML) SYRINGE FOR IV PUSH (FOR BLOOD PRESSURE SUPPORT)
PREFILLED_SYRINGE | INTRAVENOUS | Status: DC | PRN
  Administered 2021-11-17: 80 ug via INTRAVENOUS

## 2021-11-17 MED ORDER — ORAL CARE MOUTH RINSE: 15.0000 mL | Freq: Once | OROMUCOSAL | Status: AC

## 2021-11-17 MED ORDER — ENSURE PRE-SURGERY PO LIQD
592.0000 mL | Freq: Once | ORAL | Status: DC
  Filled 2021-11-17: qty 592

## 2021-11-17 MED ORDER — ENSURE PRE-SURGERY PO LIQD
296.0000 mL | Freq: Once | ORAL | Status: DC
  Filled 2021-11-17: qty 296

## 2021-11-17 MED ORDER — ALVIMOPAN 12 MG PO CAPS
12.0000 mg | ORAL_CAPSULE | ORAL | Status: AC
  Administered 2021-11-17: 07:00:00 12 mg via ORAL
  Filled 2021-11-17: qty 1

## 2021-11-17 MED ORDER — LACTATED RINGERS IV SOLN: INTRAVENOUS | Status: DC

## 2021-11-17 MED ORDER — DEXAMETHASONE SODIUM PHOSPHATE 4 MG/ML IJ SOLN
INTRAMUSCULAR | Status: DC | PRN
  Administered 2021-11-17: 8 mg via INTRAVENOUS

## 2021-11-17 MED ORDER — KETAMINE HCL 10 MG/ML IJ SOLN
INTRAMUSCULAR | Status: AC
  Filled 2021-11-17: qty 1

## 2021-11-17 MED ORDER — PROPOFOL 10 MG/ML IV BOLUS
INTRAVENOUS | Status: AC
  Filled 2021-11-17: qty 20

## 2021-11-17 MED ORDER — DEXAMETHASONE SODIUM PHOSPHATE 10 MG/ML IJ SOLN
INTRAMUSCULAR | Status: AC
  Filled 2021-11-17: qty 1

## 2021-11-17 MED ORDER — CLONIDINE HCL 0.1 MG PO TABS
0.2000 mg | ORAL_TABLET | Freq: Two times a day (BID) | ORAL | Status: DC
  Administered 2021-11-17 – 2021-11-19 (×4): 0.2 mg via ORAL
  Filled 2021-11-17 (×3): qty 2

## 2021-11-17 MED ORDER — ENSURE SURGERY PO LIQD
237.0000 mL | Freq: Two times a day (BID) | ORAL | Status: DC
  Administered 2021-11-17 – 2021-11-19 (×4): 237 mL via ORAL

## 2021-11-17 MED ORDER — LOSARTAN POTASSIUM-HCTZ 100-25 MG PO TABS: 1.0000 | ORAL_TABLET | Freq: Every day | ORAL | Status: DC

## 2021-11-17 MED ORDER — PHENYLEPHRINE 40 MCG/ML (10ML) SYRINGE FOR IV PUSH (FOR BLOOD PRESSURE SUPPORT)
PREFILLED_SYRINGE | INTRAVENOUS | Status: AC
  Filled 2021-11-17: qty 10

## 2021-11-17 MED ORDER — LIDOCAINE 2% (20 MG/ML) 5 ML SYRINGE
INTRAMUSCULAR | Status: DC | PRN
Start: 2021-11-17 — End: 2021-11-17
  Administered 2021-11-17: 100 mg via INTRAVENOUS

## 2021-11-17 MED ORDER — BUPIVACAINE LIPOSOME 1.3 % IJ SUSP
INTRAMUSCULAR | Status: AC
  Filled 2021-11-17: qty 20

## 2021-11-17 MED ORDER — LIDOCAINE HCL 2 % IJ SOLN
INTRAMUSCULAR | Status: AC
  Filled 2021-11-17: qty 20

## 2021-11-17 MED ORDER — BUPIVACAINE-EPINEPHRINE (PF) 0.25% -1:200000 IJ SOLN
INTRAMUSCULAR | Status: AC
  Filled 2021-11-17: qty 30

## 2021-11-17 MED ORDER — SPY AGENT GREEN - (INDOCYANINE FOR INJECTION)
INTRAMUSCULAR | Status: DC | PRN
  Administered 2021-11-17: 10:00:00 2.5 mL via INTRAVENOUS

## 2021-11-17 SURGICAL SUPPLY — 97 items
BAG COUNTER SPONGE SURGICOUNT (BAG) ×2 IMPLANT
BAG SPNG CNTER NS LX DISP (BAG) ×1
BLADE EXTENDED COATED 6.5IN (ELECTRODE) IMPLANT
CANNULA REDUC XI 12-8 STAPL (CANNULA)
CANNULA REDUCER 12-8 DVNC XI (CANNULA) IMPLANT
CELLS DAT CNTRL 66122 CELL SVR (MISCELLANEOUS) IMPLANT
COVER SURGICAL LIGHT HANDLE (MISCELLANEOUS) ×3 IMPLANT
COVER TIP SHEARS 8 DVNC (MISCELLANEOUS) ×1 IMPLANT
COVER TIP SHEARS 8MM DA VINCI (MISCELLANEOUS) ×2
DECANTER SPIKE VIAL GLASS SM (MISCELLANEOUS) IMPLANT
DRAIN CHANNEL 19F RND (DRAIN) IMPLANT
DRAPE ARM DVNC X/XI (DISPOSABLE) ×4 IMPLANT
DRAPE COLUMN DVNC XI (DISPOSABLE) ×1 IMPLANT
DRAPE DA VINCI XI ARM (DISPOSABLE) ×8
DRAPE DA VINCI XI COLUMN (DISPOSABLE) ×2
DRAPE SURG IRRIG POUCH 19X23 (DRAPES) ×2 IMPLANT
DRIVER LRG NEEDLE DA VINCI (INSTRUMENTS) ×2
DRIVER NDLE LRG DVNC (INSTRUMENTS) IMPLANT
DRSG OPSITE POSTOP 4X10 (GAUZE/BANDAGES/DRESSINGS) IMPLANT
DRSG OPSITE POSTOP 4X6 (GAUZE/BANDAGES/DRESSINGS) ×1 IMPLANT
DRSG OPSITE POSTOP 4X8 (GAUZE/BANDAGES/DRESSINGS) IMPLANT
ELECT PENCIL ROCKER SW 15FT (MISCELLANEOUS) ×2 IMPLANT
ELECT REM PT RETURN 15FT ADLT (MISCELLANEOUS) ×2 IMPLANT
ENDOLOOP SUT PDS II  0 18 (SUTURE)
ENDOLOOP SUT PDS II 0 18 (SUTURE) IMPLANT
EVACUATOR SILICONE 100CC (DRAIN) IMPLANT
GLOVE SURG ENC MOIS LTX SZ6.5 (GLOVE) ×6 IMPLANT
GLOVE SURG UNDER LTX SZ6.5 (GLOVE) ×2 IMPLANT
GLOVE SURG UNDER POLY LF SZ7 (GLOVE) ×4 IMPLANT
GOWN STRL REUS W/TWL XL LVL3 (GOWN DISPOSABLE) ×6 IMPLANT
GRASPER SUT TROCAR 14GX15 (MISCELLANEOUS) IMPLANT
HOLDER FOLEY CATH W/STRAP (MISCELLANEOUS) ×2 IMPLANT
IRRIG SUCT STRYKERFLOW 2 WTIP (MISCELLANEOUS) ×2
IRRIGATION SUCT STRKRFLW 2 WTP (MISCELLANEOUS) ×1 IMPLANT
KIT PROCEDURE DA VINCI SI (MISCELLANEOUS) ×2
KIT PROCEDURE DVNC SI (MISCELLANEOUS) IMPLANT
KIT TURNOVER KIT A (KITS) IMPLANT
NDL INSUFFLATION 14GA 120MM (NEEDLE) ×1 IMPLANT
NEEDLE INSUFFLATION 14GA 120MM (NEEDLE) ×2 IMPLANT
PACK CARDIOVASCULAR III (CUSTOM PROCEDURE TRAY) ×2 IMPLANT
PACK COLON (CUSTOM PROCEDURE TRAY) ×2 IMPLANT
PAD POSITIONING PINK XL (MISCELLANEOUS) ×2 IMPLANT
RELOAD STAPLE 60 2.5 WHT DVNC (STAPLE) IMPLANT
RELOAD STAPLE 60 3.5 BLU DVNC (STAPLE) IMPLANT
RELOAD STAPLE 60 4.3 GRN DVNC (STAPLE) IMPLANT
RELOAD STAPLER 2.5X60 WHT DVNC (STAPLE) ×1 IMPLANT
RELOAD STAPLER 3.5X60 BLU DVNC (STAPLE) ×1 IMPLANT
RELOAD STAPLER 4.3X60 GRN DVNC (STAPLE) ×1 IMPLANT
RETRACTOR WND ALEXIS 18 MED (MISCELLANEOUS) IMPLANT
RTRCTR WOUND ALEXIS 18CM MED (MISCELLANEOUS)
SCISSORS LAP 5X35 DISP (ENDOMECHANICALS) IMPLANT
SEAL CANN UNIV 5-8 DVNC XI (MISCELLANEOUS) ×3 IMPLANT
SEAL XI 5MM-8MM UNIVERSAL (MISCELLANEOUS) ×6
SEALER VESSEL DA VINCI XI (MISCELLANEOUS) ×2
SEALER VESSEL EXT DVNC XI (MISCELLANEOUS) ×1 IMPLANT
SOLUTION ELECTROLUBE (MISCELLANEOUS) ×2 IMPLANT
STAPLER 60 DA VINCI SURE FORM (STAPLE) ×2
STAPLER 60 SUREFORM DVNC (STAPLE) IMPLANT
STAPLER CANNULA SEAL DVNC XI (STAPLE) IMPLANT
STAPLER CANNULA SEAL XI (STAPLE)
STAPLER ECHELON POWER CIR 29 (STAPLE) IMPLANT
STAPLER ECHELON POWER CIR 31 (STAPLE) IMPLANT
STAPLER RELOAD 2.5X60 WHITE (STAPLE) ×2
STAPLER RELOAD 2.5X60 WHT DVNC (STAPLE) ×1
STAPLER RELOAD 3.5X60 BLU DVNC (STAPLE) ×1
STAPLER RELOAD 3.5X60 BLUE (STAPLE) ×2
STAPLER RELOAD 4.3X60 GREEN (STAPLE) ×2
STAPLER RELOAD 4.3X60 GRN DVNC (STAPLE) ×1
STOPCOCK 4 WAY LG BORE MALE ST (IV SETS) ×4 IMPLANT
SUT ETHILON 2 0 PS N (SUTURE) IMPLANT
SUT NOVA NAB DX-16 0-1 5-0 T12 (SUTURE) ×4 IMPLANT
SUT PROLENE 2 0 KS (SUTURE) IMPLANT
SUT SILK 2 0 (SUTURE) ×2
SUT SILK 2 0 SH CR/8 (SUTURE) IMPLANT
SUT SILK 2-0 18XBRD TIE 12 (SUTURE) ×1 IMPLANT
SUT SILK 3 0 (SUTURE)
SUT SILK 3 0 SH CR/8 (SUTURE) ×2 IMPLANT
SUT SILK 3-0 18XBRD TIE 12 (SUTURE) IMPLANT
SUT V-LOC BARB 180 2/0GR6 GS22 (SUTURE) ×4
SUT VIC AB 2-0 SH 18 (SUTURE) IMPLANT
SUT VIC AB 2-0 SH 27 (SUTURE)
SUT VIC AB 2-0 SH 27X BRD (SUTURE) IMPLANT
SUT VIC AB 3-0 SH 18 (SUTURE) IMPLANT
SUT VIC AB 4-0 PS2 27 (SUTURE) ×4 IMPLANT
SUT VICRYL 0 UR6 27IN ABS (SUTURE) ×2 IMPLANT
SUTURE V-LC BRB 180 2/0GR6GS22 (SUTURE) IMPLANT
SYR 10ML ECCENTRIC (SYRINGE) ×2 IMPLANT
SYS LAPSCP GELPORT 120MM (MISCELLANEOUS)
SYS WOUND ALEXIS 18CM MED (MISCELLANEOUS)
SYSTEM LAPSCP GELPORT 120MM (MISCELLANEOUS) IMPLANT
SYSTEM WOUND ALEXIS 18CM MED (MISCELLANEOUS) IMPLANT
TOWEL OR 17X26 10 PK STRL BLUE (TOWEL DISPOSABLE) IMPLANT
TOWEL OR NON WOVEN STRL DISP B (DISPOSABLE) ×2 IMPLANT
TRAY FOLEY MTR SLVR 16FR STAT (SET/KITS/TRAYS/PACK) ×2 IMPLANT
TROCAR ADV FIXATION 5X100MM (TROCAR) ×2 IMPLANT
TUBING CONNECTING 10 (TUBING) ×4 IMPLANT
TUBING INSUFFLATION 10FT LAP (TUBING) ×2 IMPLANT

## 2021-11-17 NOTE — Transfer of Care (Signed)
Immediate Anesthesia Transfer of Care Note  Patient: George Watkins  Procedure(s) Performed: XI ROBOT ASSISTED RIGHT COLECTOMY  Patient Location: PACU  Anesthesia Type:General  Level of Consciousness: awake  Airway & Oxygen Therapy: Patient Spontanous Breathing and Patient connected to face mask oxygen  Post-op Assessment: Report given to RN and Post -op Vital signs reviewed and stable  Post vital signs: Reviewed and stable  Last Vitals:  Vitals Value Taken Time  BP    Temp    Pulse 54 11/17/21 1127  Resp 13 11/17/21 1127  SpO2 100 % 11/17/21 1127  Vitals shown include unvalidated device data.  Last Pain:  Vitals:   11/17/21 0658  TempSrc: Oral  PainSc:       Patients Stated Pain Goal: 3 (88/11/03 1594)  Complications: No notable events documented.

## 2021-11-17 NOTE — Anesthesia Postprocedure Evaluation (Signed)
Anesthesia Post Note  Patient: George Watkins  Procedure(s) Performed: XI ROBOT ASSISTED RIGHT COLECTOMY     Patient location during evaluation: PACU Anesthesia Type: General Level of consciousness: awake and alert and oriented Pain management: pain level controlled Vital Signs Assessment: post-procedure vital signs reviewed and stable Respiratory status: spontaneous breathing, nonlabored ventilation and respiratory function stable Cardiovascular status: blood pressure returned to baseline Postop Assessment: no apparent nausea or vomiting Anesthetic complications: no   No notable events documented.  Last Vitals:  Vitals:   11/17/21 1300 11/17/21 1315  BP: 123/75 124/74  Pulse: (!) 54 (!) 46  Resp: 10 11  Temp:    SpO2: 91% 97%    Last Pain:  Vitals:   11/17/21 1315  TempSrc:   PainSc: 0-No pain                 Marthenia Rolling

## 2021-11-17 NOTE — Anesthesia Procedure Notes (Signed)
Procedure Name: Intubation Date/Time: 11/17/2021 8:45 AM Performed by: [place] Demaree Liberto C, CRNA Pre-anesthesia Checklist: Patient identified, Emergency Drugs available, Suction available and Patient being monitored Patient Re-evaluated:Patient Re-evaluated prior to induction Oxygen Delivery Method: Circle system utilized Preoxygenation: Pre-oxygenation with 100% oxygen Induction Type: IV induction Ventilation: Mask ventilation without difficulty Laryngoscope Size: Mac and 4 Grade View: Grade I Tube type: Oral Tube size: 7.5 mm Number of attempts: 1 Airway Equipment and Method: Stylet and Oral airway Placement Confirmation: ETT inserted through vocal cords under direct vision, positive ETCO2 and breath sounds checked- equal and bilateral Secured at: 22 cm Tube secured with: Tape Dental Injury: Teeth and Oropharynx as per pre-operative assessment

## 2021-11-17 NOTE — Progress Notes (Signed)
°   11/17/21 1601  Assess: MEWS Score  Temp 97.6 F (36.4 C)  BP 131/78  Pulse Rate (!) 47  Resp 13  Level of Consciousness Alert  SpO2 100 %  Assess: MEWS Score  MEWS Temp 0  MEWS Systolic 0  MEWS Pulse 1  MEWS RR 1  MEWS LOC 0  MEWS Score 2  MEWS Score Color Yellow  Assess: if the MEWS score is Yellow or Red  Were vital signs taken at a resting state? Yes  Focused Assessment No change from prior assessment  Does the patient meet 2 or more of the SIRS criteria? No  MEWS guidelines implemented *See Row Information* No, vital signs rechecked  Treat  MEWS Interventions Escalated (See documentation below)  Take Vital Signs  Increase Vital Sign Frequency  Yellow: Q 2hr X 2 then Q 4hr X 2, if remains yellow, continue Q 4hrs  Escalate  MEWS: Escalate Yellow: discuss with charge nurse/RN and consider discussing with provider and RRT  Notify: Charge Nurse/RN  Name of Charge Nurse/RN Notified Cristy RN  Date Charge Nurse/RN Notified 11/17/21  Time Charge Nurse/RN Notified 1610  Document  Patient Outcome Other (Comment) (patient baseline d/t medications.Pt asymptomatic)  Progress note created (see row info) Yes  Assess: SIRS CRITERIA  SIRS Temperature  0  SIRS Pulse 0  SIRS Respirations  0  SIRS WBC 0  SIRS Score Sum  0

## 2021-11-17 NOTE — Op Note (Signed)
11/17/2021  11:17 AM  PATIENT:  George Watkins  71 y.o. male  Patient Care Team: Arthur Holms, NP as PCP - General (Nurse Practitioner) Truitt Merle, MD as Consulting Physician (Oncology)  PRE-OPERATIVE DIAGNOSIS:  COLON CANCER  POST-OPERATIVE DIAGNOSIS:  COLON CANCER  PROCEDURE:  Procedure(s): XI ROBOT ASSISTED RIGHT COLECTOMY  SURGEON:  Surgeon(s): Leighton Ruff, MD Michael Boston, MD  ASSISTANT: Dr Johney Maine   ANESTHESIA:   local and general  EBL: 75ml Total I/O In: 1000 [I.V.:1000] Out: 100 [Urine:100]  Delay start of Pharmacological VTE agent (>24hrs) due to surgical blood loss or risk of bleeding:  no  DRAINS: none   SPECIMEN:  Source of Specimen:  R colon  DISPOSITION OF SPECIMEN:  PATHOLOGY  COUNTS:  YES  PLAN OF CARE: Admit to inpatient   PATIENT DISPOSITION:  PACU - hemodynamically stable.  INDICATION:    71 y.o. M with ascending colon cancer.  I recommended segmental resection:  The anatomy & physiology of the digestive tract was discussed.  The pathophysiology was discussed.  Natural history risks without surgery was discussed.   I worked to give an overview of the disease and the frequent need to have multispecialty involvement.  I feel the risks of no intervention will lead to serious problems that outweigh the operative risks; therefore, I recommended a partial colectomy to remove the pathology.  Laparoscopic & open techniques were discussed.   Risks such as bleeding, infection, abscess, leak, reoperation, possible ostomy, hernia, heart attack, death, and other risks were discussed.  I noted a good likelihood this will help address the problem.   Goals of post-operative recovery were discussed as well.    The patient expressed understanding & wished to proceed with surgery.  OR FINDINGS:   Patient had large, bulky ascending colon mass with foreshortened mesentery.  No obvious metastatic disease on visceral parietal peritoneum or liver.  DESCRIPTION:    Informed consent was confirmed.  The patient underwent general anaesthesia without difficulty.  The patient was positioned appropriately.  VTE prevention in place.  The patient's abdomen was clipped, prepped, & draped in a sterile fashion.  Surgical timeout confirmed our plan.  The patient was positioned in reverse Trendelenburg.  Abdominal entry was gained using a Varies needle in the LUQ.  Entry was clean.  I induced carbon dioxide insufflation.  An 26mm robotic port was placed in the LUQ.  Camera inspection revealed no injury.  Extra ports were carefully placed under direct laparoscopic visualization.  I laparoscopically reflected the greater omentum and the upper abdomen the small bowel in the peilvis. The patient was appropriately positioned and the robot was docked to the patient's right side.  Instruments were placed under direct visualization.    I began by identifying the ileocolic artery and vein within the mesentery. Dissection was bluntly carried around these structures. The duodenum was identified and free from the structures. I then separated the structures bluntly and used the robotic vessel sealer device to transect these.  I developed the retroperitoneal plane bluntly.  I then freed the appendix off its attachments to the pelvic wall. I mobilized the terminal ileum as well.  I took care to avoid injuring any retroperitoneal structures.  After this I began to mobilize laterally down the white line of Toldt and then took down the hepatic flexure using the Enseal device. I mobilized the omentum off of the right transverse colon and then divided the colo hepatic ligament. The entire colon was then flipped medially and mobilized  off of the retroperitoneal structures until I could visualize the lateral edge of the duodenum underneath.  I gently freed the duodenal attachments.   I identified a portion of mesentery of the transverse colon just proximal to the right branch of the middle colic.  I  divided up to the colon from the previous dissection of the mesentery using the robotic vessel sealer.  I then divided the terminal ileal mesentery in similar fashion.  We injected firefly to assess for perfusion.  This showed good perfusion at the colon resection point and terminal ileum.  At that point, the terminal ileum was divided with a blue load robotic 60 mm stapler.  The transverse colon was divided with a green load robotic stapler.  The specimen was then completely free and placed in the right upper quadrant.  Hemostasis was good.  I then oriented the remaining terminal ileum and transverse colon and a isoperistaltic fashion.  I placed an enterotomy in the small bowel and colon using the robotic scissors.  I then introduced a white load 60 mm robotic stapler into both enterotomies and created an anastomosis between the small bowel and transverse colon.  Hemostasis within the staple line was good.  The common enterotomy channel was closed using 2 running 2-0 V-Loc sutures.  The abdomen was inspected.  Hemostasis was good.  There was no sign of injury to the remaining structures. The omentum was then brought down over the anastomosis.  At this point the robot was undocked.  The 12 mm suprapubic port was enlarged to a Pfannenstiel incision and an Barton wound protector was placed.  The specimen was removed from the abdomen and evaluated.  Once the abdomen was inspected for hemostasis, the [employer] wound protector was removed.    The peritoneum of the Pfannenstiel incision was closed using a running 0 Vicryl suture.  The fascia was then closed using 2 running #1 Novafil sutures.  The subcutaneous tissue of the extraction incision was closed using a running 2-0 Vicryl suture. The skin was then closed using running subcuticular 4-0 Vicryl sutures.  A sterile dressing was applied.  The remaining port sites were closed using interrupted 4-0 Vicryl sutures and Dermabond. All counts were correct per operating room  staff. The patient was then awakened from anesthesia and sent to the post anesthesia care unit in stable condition.

## 2021-11-17 NOTE — Interval H&P Note (Signed)
History and Physical Interval Note:  11/17/2021 8:15 AM  George Watkins  has presented today for surgery, with the diagnosis of COLON CANCER.  The various methods of treatment have been discussed with the patient and family. After consideration of risks, benefits and other options for treatment, the patient has consented to  Procedure(s): XI ROBOT ASSISTED LAPAROSCOPIC PARTIAL COLECTOMY (N/A) as a surgical intervention.  The patient's history has been reviewed, patient examined, no change in status, stable for surgery.  I have reviewed the patient's chart and labs.  Questions were answered to the patient's satisfaction.     Rosario Adie, MD  Colorectal [facility]

## 2021-11-18 LAB — BASIC METABOLIC PANEL
Anion gap: 5 (ref 5–15)
BUN: 14 mg/dL (ref 8–23)
CO2: 28 mmol/L (ref 22–32)
Calcium: 8.4 mg/dL — ABNORMAL LOW (ref 8.9–10.3)
Chloride: 101 mmol/L (ref 98–111)
Creatinine, Ser: 1.16 mg/dL (ref 0.61–1.24)
GFR, Estimated: >60 mL/min (ref >60)
Glucose, Bld: 154 mg/dL — ABNORMAL HIGH (ref 70–99)
Potassium: 3.7 mmol/L (ref 3.5–5.1)
Sodium: 134 mmol/L — ABNORMAL LOW (ref 135–145)

## 2021-11-18 LAB — CBC
HCT: 33.7 % — ABNORMAL LOW (ref 39.0–52.0)
Hemoglobin: 10.5 g/dL — ABNORMAL LOW (ref 13.0–17.0)
MCH: 26.7 pg (ref 26.0–34.0)
MCHC: 31.2 g/dL (ref 30.0–36.0)
MCV: 85.8 fL (ref 80.0–100.0)
Platelets: 227 10*3/uL (ref 150–400)
RBC: 3.93 MIL/uL — ABNORMAL LOW (ref 4.22–5.81)
RDW: 14 % (ref 11.5–15.5)
WBC: 13.3 10*3/uL — ABNORMAL HIGH (ref 4.0–10.5)
nRBC: 0 % (ref 0.0–0.2)

## 2021-11-18 NOTE — Progress Notes (Signed)
Foley removed. Pt tolerated well. Peri care done post removal. Pt educated on importance of voiding post removal. Pt voiced understanding. Will continue to monitor.

## 2021-11-18 NOTE — Progress Notes (Signed)
Transition of Care [facility]) Screening Note  Patient Details  Name: George Watkins Date of Birth: 11/19/1950  Transition of Care [facility]) CM/SW Contact:    Sherie Don, LCSW Phone Number: 11/18/2021, 12:54 PM  Transition of Care Department [facility]) has reviewed patient and no TOC needs have been identified at this time. We will continue to monitor patient advancement through interdisciplinary progression rounds. If new patient transition needs arise, please place a TOC consult.

## 2021-11-18 NOTE — Progress Notes (Signed)
1 Day Post-Op Robotic R colectomy Subjective: Feeling well.  Ambulating.  Tolerating fulls. Having bowel function.    Objective: Vital signs in last 24 hours: Temp:  [97.5 F (36.4 C)-98.5 F (36.9 C)] 98.2 F (36.8 C) (01/26 0600) Pulse Rate:  [46-64] 64 (01/26 0600) Resp:  [10-18] 17 (01/26 0600) BP: (104-155)/(70-94) 122/75 (01/26 0600) SpO2:  [91 %-100 %] 97 % (01/26 0600) Weight:  [98.2 kg-98.6 kg] 98.6 kg (01/26 0600)   Intake/Output from previous day: 01/25 0701 - 01/26 0700 In: 2180.2 [P.O.:240; I.V.:1840.2; IV Piggyback:100] Out: 4656 [Urine:3375; Blood:20] Intake/Output this shift: No intake/output data recorded.   General appearance: alert and cooperative Abd: soft, non-distended Incision: no significant drainage  Lab Results:  Recent Labs    11/18/21 0401  WBC 13.3*  HGB 10.5*  HCT 33.7*  PLT 227   BMET Recent Labs    11/18/21 0401  NA 134*  K 3.7  CL 101  CO2 28  GLUCOSE 154*  BUN 14  CREATININE 1.16  CALCIUM 8.4*   PT/INR No results for input(s): LABPROT, INR in the last 72 hours. ABG No results for input(s): PHART, HCO3 in the last 72 hours.  Invalid input(s): PCO2, PO2  MEDS, Scheduled  acetaminophen  1,000 mg Oral Q6H   alvimopan  12 mg Oral BID   amLODipine  5 mg Oral Daily   cloNIDine  0.2 mg Oral BID   enoxaparin (LOVENOX) injection  40 mg Subcutaneous Q24H   feeding supplement  237 mL Oral BID BM   gabapentin  300 mg Oral BID   losartan  100 mg Oral Daily   And   hydrochlorothiazide  25 mg Oral Daily   labetalol  200 mg Oral BID   pantoprazole  40 mg Oral Daily   saccharomyces boulardii  250 mg Oral BID    Studies/Results: No results found.  Assessment: s/p Procedure(s): XI ROBOT ASSISTED RIGHT COLECTOMY Patient Active Problem List   Diagnosis Date Noted   Colon cancer ([place]) 11/17/2021   Colorectal cancer ([place]) 10/28/2021    Expected post op course  Plan: d/c foley Advance diet to soft foods SL IVF's Cont  to ambulate   LOS: 1 day     .Rosario Adie, MD [facility], [place]    11/18/2021 7:29 AM

## 2021-11-18 NOTE — Progress Notes (Signed)
Pt has voided post foley removal.

## 2021-11-19 ENCOUNTER — Other Ambulatory Visit (HOSPITAL_COMMUNITY): Payer: Self-pay

## 2021-11-19 LAB — SURGICAL PATHOLOGY

## 2021-11-19 LAB — BASIC METABOLIC PANEL
Anion gap: 5 (ref 5–15)
BUN: 17 mg/dL (ref 8–23)
CO2: 30 mmol/L (ref 22–32)
Calcium: 8.3 mg/dL — ABNORMAL LOW (ref 8.9–10.3)
Chloride: 102 mmol/L (ref 98–111)
Creatinine, Ser: 1.07 mg/dL (ref 0.61–1.24)
GFR, Estimated: >60 mL/min (ref >60)
Glucose, Bld: 113 mg/dL — ABNORMAL HIGH (ref 70–99)
Potassium: 3.9 mmol/L (ref 3.5–5.1)
Sodium: 137 mmol/L (ref 135–145)

## 2021-11-19 LAB — CBC
HCT: 34.5 % — ABNORMAL LOW (ref 39.0–52.0)
Hemoglobin: 10.4 g/dL — ABNORMAL LOW (ref 13.0–17.0)
MCH: 26.3 pg (ref 26.0–34.0)
MCHC: 30.1 g/dL (ref 30.0–36.0)
MCV: 87.3 fL (ref 80.0–100.0)
Platelets: 227 10*3/uL (ref 150–400)
RBC: 3.95 MIL/uL — ABNORMAL LOW (ref 4.22–5.81)
RDW: 14 % (ref 11.5–15.5)
WBC: 10.2 10*3/uL (ref 4.0–10.5)
nRBC: 0 % (ref 0.0–0.2)

## 2021-11-19 MED ORDER — TRAMADOL HCL 50 MG PO TABS
50.0000 mg | ORAL_TABLET | Freq: Four times a day (QID) | ORAL | 0 refills | Status: DC | PRN
  Filled 2021-11-19: qty 30, 4d supply, fill #0

## 2021-11-19 NOTE — Discharge Instructions (Signed)
SURGERY: POST OP INSTRUCTIONS (Surgery for small bowel obstruction, colon resection, etc)   ######################################################################  EAT Gradually transition to a high fiber diet with a fiber supplement over the next few days after discharge  WALK Walk an hour a day.  Control your pain to do that.    CONTROL PAIN Control pain so that you can walk, sleep, tolerate sneezing/coughing, go up/down stairs.  HAVE A BOWEL MOVEMENT DAILY Keep your bowels regular to avoid problems.  OK to try a laxative to override constipation.  OK to use an antidairrheal to slow down diarrhea.  Call if not better after 2 tries  CALL IF YOU HAVE PROBLEMS/CONCERNS Call if you are still struggling despite following these instructions. Call if you have concerns not answered by these instructions  ######################################################################   DIET Follow a light diet the first few days at home.  Start with a bland diet such as soups, liquids, starchy foods, low fat foods, etc.  If you feel full, bloated, or constipated, stay on a ful liquid or pureed/blenderized diet for a few days until you feel better and no longer constipated. Be sure to drink plenty of fluids every day to avoid getting dehydrated (feeling dizzy, not urinating, etc.). Gradually add a fiber supplement to your diet over the next week.  Gradually get back to a regular solid diet.  Avoid fast food or heavy meals the first week as you are more likely to get nauseated. It is expected for your digestive tract to need a few months to get back to normal.  It is common for your bowel movements and stools to be irregular.  You will have occasional bloating and cramping that should eventually fade away.  Until you are eating solid food normally, off all pain medications, and back to regular activities; your bowels will not be normal. Focus on eating a low-fat, high fiber diet the rest of your life  (See Getting to Good Bowel Health, below).  CARE of your INCISION or WOUND  It is good for closed incisions and even open wounds to be washed every day.  Shower every day.  Short baths are fine.  Wash the incisions and wounds clean with soap & water.    You may leave closed incisions open to air if it is dry.   You may cover the incision with clean gauze & replace it after your daily shower for comfort.  STAPLES: You have skin staples.  Leave them in place & set up an appointment for them to be removed by a surgery office nurse ~10 days after surgery. = 1st week of January 2024    ACTIVITIES as tolerated Start light daily activities --- self-care, walking, climbing stairs-- beginning the day after surgery.  Gradually increase activities as tolerated.  Control your pain to be active.  Stop when you are tired.  Ideally, walk several times a day, eventually an hour a day.   Most people are back to most day-to-day activities in a few weeks.  It takes 4-8 weeks to get back to unrestricted, intense activity. If you can walk 30 minutes without difficulty, it is safe to try more intense activity such as jogging, treadmill, bicycling, low-impact aerobics, swimming, etc. Save the most intensive and strenuous activity for last (Usually 4-8 weeks after surgery) such as sit-ups, heavy lifting, contact sports, etc.  Refrain from any intense heavy lifting or straining until you are off narcotics for pain control.  You will have off days, but things should improve   week-by-week. DO NOT PUSH THROUGH PAIN.  Let pain be your guide: If it hurts to do something, don't do it.  Pain is your body warning you to avoid that activity for another week until the pain goes down. You may drive when you are no longer taking narcotic prescription pain medication, you can comfortably wear a seatbelt, and you can safely make sudden turns/stops to protect yourself without hesitating due to pain. You may have sexual intercourse when it  is comfortable. If it hurts to do something, stop.  MEDICATIONS Take your usually prescribed home medications unless otherwise directed.   Blood thinners:  Usually you can restart any strong blood thinners after the second postoperative day.  It is OK to take aspirin right away.     If you are on strong blood thinners (warfarin/Coumadin, Plavix, Xerelto, Eliquis, Pradaxa, etc), discuss with your surgeon, medicine PCP, and/or cardiologist for instructions on when to restart the blood thinner & if blood monitoring is needed (PT/INR blood check, etc).     PAIN CONTROL Pain after surgery or related to activity is often due to strain/injury to muscle, tendon, nerves and/or incisions.  This pain is usually short-term and will improve in a few months.  To help speed the process of healing and to get back to regular activity more quickly, DO THE FOLLOWING THINGS TOGETHER: Increase activity gradually.  DO NOT PUSH THROUGH PAIN Use Ice and/or Heat Try Gentle Massage and/or Stretching Take over the counter pain medication Take Narcotic prescription pain medication for more severe pain  Good pain control = faster recovery.  It is better to take more medicine to be more active than to stay in bed all day to avoid medications.  Increase activity gradually Avoid heavy lifting at first, then increase to lifting as tolerated over the next 6 weeks. Do not "push through" the pain.  Listen to your body and avoid positions and maneuvers than reproduce the pain.  Wait a few days before trying something more intense Walking an hour a day is encouraged to help your body recover faster and more safely.  Start slowly and stop when getting sore.  If you can walk 30 minutes without stopping or pain, you can try more intense activity (running, jogging, aerobics, cycling, swimming, treadmill, sex, sports, weightlifting, etc.) Remember: If it hurts to do it, then don't do it! Use Ice and/or Heat You will have swelling and  bruising around the incisions.  This will take several weeks to resolve. Ice packs or heating pads (6-8 times a day, 30-60 minutes at a time) will help sooth soreness & bruising. Some people prefer to use ice alone, heat alone, or alternate between ice & heat.  Experiment and see what works best for you.  Consider trying ice for the first few days to help decrease swelling and bruising; then, switch to heat to help relax sore spots and speed recovery. Shower every day.  Short baths are fine.  It feels good!  Keep the incisions and wounds clean with soap & water.   Try Gentle Massage and/or Stretching Massage at the area of pain many times a day Stop if you feel pain - do not overdo it Take over the counter pain medication This helps the muscle and nerve tissues become less irritable and calm down faster Choose ONE of the following over-the-counter anti-inflammatory medications: Acetaminophen 500mg tabs (Tylenol) 1-2 pills with every meal and just before bedtime (avoid if you have liver problems or if you have   acetaminophen in you narcotic prescription) Naproxen 220mg tabs (ex. Aleve, Naprosyn) 1-2 pills twice a day (avoid if you have kidney, stomach, IBD, or bleeding problems) Ibuprofen 200mg tabs (ex. Advil, Motrin) 3-4 pills with every meal and just before bedtime (avoid if you have kidney, stomach, IBD, or bleeding problems) Take with food/snack several times a day as directed for at least 2 weeks to help keep pain / soreness down & more manageable. Take Narcotic prescription pain medication for more severe pain A prescription for strong pain control is often given to you upon discharge (for example: oxycodone/Percocet, hydrocodone/Norco/Vicodin, or tramadol/Ultram) Take your pain medication as prescribed. Be mindful that most narcotic prescriptions contain Tylenol (acetaminophen) as well - avoid taking too much Tylenol. If you are having problems/concerns with the prescription medicine (does  not control pain, nausea, vomiting, rash, itching, etc.), please call us [phone] to see if we need to switch you to a different pain medicine that will work better for you and/or control your side effects better. If you need a refill on your pain medication, you must call the office before 4 pm and on weekdays only.  By federal law, prescriptions for narcotics cannot be called into a pharmacy.  They must be filled out on paper & picked up from our office by the patient or authorized caretaker.  Prescriptions cannot be filled after 4 pm nor on weekends.    WHEN TO CALL US [phone] Severe uncontrolled or worsening pain  Fever over 101 F (38.5 C) Concerns with the incision: Worsening pain, redness, rash/hives, swelling, bleeding, or drainage Reactions / problems with new medications (itching, rash, hives, nausea, etc.) Nausea and/or vomiting Difficulty urinating Difficulty breathing Worsening fatigue, dizziness, lightheadedness, blurred vision Other concerns If you are not getting better after two weeks or are noticing you are getting worse, contact our office [phone] for further advice.  We may need to adjust your medications, re-evaluate you in the office, send you to the emergency room, or see what other things we can do to help. The clinic staff is available to answer your questions during regular business hours (8:30am-5pm).  Please don't hesitate to call and ask to speak to one of our nurses for clinical concerns.    A surgeon from [facility] is always on call at the hospitals 24 hours/day If you have a medical emergency, go to the nearest emergency room or call 911.  FOLLOW UP in our office One the day of your discharge from the hospital (or the next business weekday), please call [facility] to set up or confirm an appointment to see your surgeon in the office for a follow-up appointment.  Usually it is 2-3 weeks after your surgery.   If you  have skin staples at your incision(s), let the office know so we can set up a time in the office for the nurse to remove them (usually around 10 days after surgery). Make sure that you call for appointments the day of discharge (or the next business weekday) from the hospital to ensure a convenient appointment time. IF YOU HAVE DISABILITY OR FAMILY LEAVE FORMS, BRING THEM TO THE OFFICE FOR PROCESSING.  DO NOT GIVE THEM TO YOUR DOCTOR.  [facility] [address], [address], [place], [place]  [address] ? [phone] - Main [phone] - Toll Free,  [phone] - Fax [email or web address]    GETTING TO GOOD BOWEL HEALTH. It is expected for your digestive tract to   need a few months to get back to normal.  It is common for your bowel movements and stools to be irregular.  You will have occasional bloating and cramping that should eventually fade away.  Until you are eating solid food normally, off all pain medications, and back to regular activities; your bowels will not be normal.   Avoiding constipation The goal: ONE SOFT BOWEL MOVEMENT A DAY!    Drink plenty of fluids.  Choose water first. TAKE A FIBER SUPPLEMENT EVERY DAY THE REST OF YOUR LIFE During your first week back home, gradually add back a fiber supplement every day Experiment which form you can tolerate.   There are many forms such as powders, tablets, wafers, gummies, etc Psyllium bran (Metamucil), methylcellulose (Citrucel), Miralax or Glycolax, Benefiber, Flax Seed.  Adjust the dose week-by-week (1/2 dose/day to 6 doses a day) until you are moving your bowels 1-2 times a day.  Cut back the dose or try a different fiber product if it is giving you problems such as diarrhea or bloating. Sometimes a laxative is needed to help jump-start bowels if constipated until the fiber supplement can help regulate your bowels.  If you are tolerating eating & you are farting, it is okay to try a gentle  laxative such as double dose MiraLax, prune juice, or Milk of Magnesia.  Avoid using laxatives too often. Stool softeners can sometimes help counteract the constipating effects of narcotic pain medicines.  It can also cause diarrhea, so avoid using for too long. If you are still constipated despite taking fiber daily, eating solids, and a few doses of laxatives, call our office. Controlling diarrhea Try drinking liquids and eating bland foods for a few days to avoid stressing your intestines further. Avoid dairy products (especially milk & ice cream) for a short time.  The intestines often can lose the ability to digest lactose when stressed. Avoid foods that cause gassiness or bloating.  Typical foods include beans and other legumes, cabbage, broccoli, and dairy foods.  Avoid greasy, spicy, fast foods.  Every person has some sensitivity to other foods, so listen to your body and avoid those foods that trigger problems for you. Probiotics (such as active yogurt, Align, etc) may help repopulate the intestines and colon with normal bacteria and calm down a sensitive digestive tract Adding a fiber supplement gradually can help thicken stools by absorbing excess fluid and retrain the intestines to act more normally.  Slowly increase the dose over a few weeks.  Too much fiber too soon can backfire and cause cramping & bloating. It is okay to try and slow down diarrhea with a few doses of antidiarrheal medicines.   Bismuth subsalicylate (ex. Kayopectate, Pepto Bismol) for a few doses can help control diarrhea.  Avoid if pregnant.   Loperamide (Imodium) can slow down diarrhea.  Start with one tablet (2mg) first.  Avoid if you are having fevers or severe pain.  ILEOSTOMY PATIENTS WILL HAVE CHRONIC DIARRHEA since their colon is not in use.    Drink plenty of liquids.  You will need to drink even more glasses of water/liquid a day to avoid getting dehydrated. Record output from your ileostomy.  Expect to empty  the bag every 3-4 hours at first.  Most people with a permanent ileostomy empty their bag 4-6 times at the least.   Use antidiarrheal medicine (especially Imodium) several times a day to avoid getting dehydrated.  Start with a dose at bedtime & breakfast.  Adjust up or   down as needed.  Increase antidiarrheal medications as directed to avoid emptying the bag more than 8 times a day (every 3 hours). Work with your wound ostomy nurse to learn care for your ostomy.  See ostomy care instructions. TROUBLESHOOTING IRREGULAR BOWELS 1) Start with a soft & bland diet. No spicy, greasy, or fried foods.  2) Avoid gluten/wheat or dairy products from diet to see if symptoms improve. 3) Miralax 17gm or flax seed mixed in 8oz. water or juice-daily. May use 2-4 times a day as needed. 4) Gas-X, Phazyme, etc. as needed for gas & bloating.  5) Prilosec (omeprazole) over-the-counter as needed 6)  Consider probiotics (Align, Activa, etc) to help calm the bowels down  Call your doctor if you are getting worse or not getting better.  Sometimes further testing (cultures, endoscopy, X-ray studies, CT scans, bloodwork, etc.) may be needed to help diagnose and treat the cause of the diarrhea. [facility] [address], [address], [place], [place]  [address] [phone] - Main.    [phone]  - Toll Free.   [phone] - Fax [email or web address]   ###############################   #######################################################  Ostomy Support Information  You've heard that people get along just fine with only one of their eyes, or one of their lungs, or one of their kidneys. But you also know that you have only one intestine and only one bladder, and that leaves you feeling awfully empty, both physically and emotionally: You think no other people go around without part of their intestine with the ends of their intestines sticking out through their abdominal walls.    YOU ARE NOT ALONE.  There are nearly three quarters of a million people in the [place] who have an ostomy; people who have had surgery to remove all or part of their colons or bladders.   There is even a national association, the [employer] with over 350 local affiliated support groups that are organized by volunteers who provide peer support and counseling. [employer] has a toll free telephone num-ber, 800-826-0826 and an educational, interactive website, www.ostomy.org   An ostomy is an opening in the belly (abdominal wall) made by surgery. Ostomates are people who have had this procedure. The opening (stoma) allows the kidney or bowel to grdischarge waste. An external pouch covers the stoma to collect waste. Pouches are are a simple bag and are odor free. Different companies have disposable or reusable pouches to fit one's lifestyle. An ostomy can either be temporary or permanent.   THERE ARE THREE MAIN TYPES OF OSTOMIES Colostomy. A colostomy is a surgically created opening in the large intestine (colon). Ileostomy. An ileostomy is a surgically created opening in the small intestine. Urostomy. A urostomy is a surgically created opening to divert urine away from the bladder.  OSTOMY Care  The following guidelines will make care of your colostomy easier. Keep this information close by for quick reference.  Helpful DIET hints Eat a well-balanced diet including vegetables and fresh fruits. Eat on a regular schedule.  Drink at least 6 to 8 glasses of fluids daily. Eat slowly in a relaxed atmosphere. Chew your food thoroughly. Avoid chewing gum, smoking, and drinking from a straw. This will help decrease the amount of air you swallow, which may help reduce gas. Eating yogurt or drinking buttermilk may help reduce gas.  To control gas at night, do not eat after 8 p.m. This will give your bowel time to quiet down before you go   to bed.  If gas is a problem, you can purchase  Beano. Sprinkle Beano on the first bite of food before eating to reduce gas. It has no flavor and should not change the taste of your food. You can buy Beano over the counter at your local drugstore.  Foods like fish, onions, garlic, broccoli, asparagus, and cabbage produce odor. Although your pouch is odor-proof, if you eat these foods you may notice a stronger odor when emptying your pouch. If this is a concern, you may want to limit these foods in your diet.  If you have an ileostomy, you will have chronic diarrhea & need to drink more liquids to avoid getting dehydrated.  Consider antidiarrheal medicine like imodium (loperamide) or Lomotil to help slow down bowel movements / diarrhea into your ileostomy bag.  GETTING TO GOOD BOWEL HEALTH WITH AN ILEOSTOMY    With the colon bypassed & not in use, you will have small bowel diarrhea.   It is important to thicken & slow your bowel movements down.   The goal: 4-6 small BOWEL MOVEMENTS A DAY It is important to drink plenty of liquids to avoid getting dehydrated  CONTROLLING ILEOSTOMY DIARRHEA  TAKE A FIBER SUPPLEMENT (FiberCon or Benefiner soluble fiber) twice a day - to thicken stools by absorbing excess fluid and retrain the intestines to act more normally.  Slowly increase the dose over a few weeks.  Too much fiber too soon can backfire and cause cramping & bloating.  TAKE AN IRON SUPPLEMENT twice a day to naturally constipate your bowels.  Usually ferrous sulfate 325mg twice a day)  TAKE ANTI-DIARRHEAL MEDICINES: Loperamide (Imodium) can slow down diarrhea.  Start with two tablets (= 4mg) first and then try one tablet every 6 hours.  Can go up to 2 pills four times day (8 pills of 2mg max) Avoid if you are having fevers or severe pain.  If you are not better or start feeling worse, stop all medicines and call your doctor for advice LoMotil (Diphenoxylate / Atropine) is another medicine that can constipate & slow down bowel moevements Pepto  Bismol (bismuth) can gently thicken bowels as well  If diarrhea is worse,: drink plenty of liquids and try simpler foods for a few days to avoid stressing your intestines further. Avoid dairy products (especially milk & ice cream) for a short time.  The intestines often can lose the ability to digest lactose when stressed. Avoid foods that cause gassiness or bloating.  Typical foods include beans and other legumes, cabbage, broccoli, and dairy foods.  Every person has some sensitivity to other foods, so listen to our body and avoid those foods that trigger problems for you.Call your doctor if you are getting worse or not better.  Sometimes further testing (cultures, endoscopy, X-ray studies, bloodwork, etc) may be needed to help diagnose and treat the cause of the diarrhea. Take extra anti-diarrheal medicines (maximum is 8 pills of 2mg loperamide a day)   Tips for POUCHING an OSTOMY   Changing Your Pouch The best time to change your pouch is in the morning, before eating or drinking anything. Your stoma can function at any time, but it will function more after eating or drinking.   Applying the pouching system  Place all your equipment close at hand before removing your pouch.  Wash your hands.  Stand or sit in front of a mirror. Use the position that works best for you. Remember that you must keep the skin around the stoma   wrinkle-free for a good seal.  Gently remove the used pouch (1-piece system) or the pouch and old wafer (2-piece system). Empty the pouch into the toilet. Save the closure clip to use again.  Wash the stoma itself and the skin around the stoma. Your stoma may bleed a little when being washed. This is normal. Rinse and pat dry. You may use a wash cloth or soft paper towels (like Bounty), mild soap (like Dial, Safeguard, or Ivory), and water. Avoid soaps that contain perfumes or lotions.  For a new pouch (1-piece system) or a new wafer (2-piece system), measure your  stoma using the stoma guide in each box of supplies.  Trace the shape of your stoma onto the back of the new pouch or the back of the new wafer. Cut out the opening. Remove the paper backing and set it aside.  Optional: Apply a skin barrier powder to surrounding skin if it is irritated (bare or weeping), and dust off the excess. Optional: Apply a skin-prep wipe (such as Skin Prep or All-Kare) to the skin around the stoma, and let it dry. Do not apply this solution if the skin is irritated (red, tender, or broken) or if you have shaved around the stoma. Optional: Apply a skin barrier paste (such as Stomahesive, Coloplast, or Premium) around the opening cut in the back of the pouch or wafer. Allow it to dry for 30 to 60 seconds.  Hold the pouch (1-piece system) or wafer (2-piece system) with the sticky side toward your body. Make sure the skin around the stoma is wrinkle-free. Center the opening on the stoma, then press firmly to your abdomen (Fig. 4). Look in the mirror to check if you are placing the pouch, or wafer, in the right position. For a 2-piece system, snap the pouch onto the wafer. Make sure it snaps into place securely.  Place your hand over the stoma and the pouch or wafer for about 30 seconds. The heat from your hand can help the pouch or wafer stick to your skin.  Add deodorant (such as Super Banish or Nullo) to your pouch. Other options include food extracts such as vanilla oil and peppermint extract. Add about 10 drops of the deodorant to the pouch. Then apply the closure clamp. Note: Do not use toxic  chemicals or commercial cleaning agents in your pouch. These substances may harm the stoma.  Optional: For extra seal, apply tape to all 4 sides around the pouch or wafer, as if you were framing a picture. You may use any brand of medical adhesive tape. Change your pouch every 5 to 7 days. Change it immediately if a leak occurs.  Wash your hands afterwards.  If you are wearing a  2-piece system, you may use 2 new pouches per week and alternate them. Rinse the pouch with mild soap and warm water and hang it to dry for the next day. Apply the fresh pouch. Alternate the 2 pouches like this for a week. After a week, change the wafer and begin with 2 new pouches. Place the old pouches in a plastic bag, and put them in the trash.   LIVING WITH AN OSTOMY  Emptying Your Pouch Empty your pouch when it is one-third full (of urine, stool, and/or gas). If you wait until your pouch is fuller than this, it will be more difficult to empty and more noticeable. When you empty your pouch, either put toilet paper in the toilet bowl first, or flush the   toilet while you empty the pouch. This will reduce splashing. You can empty the pouch between your legs or to one side while sitting, or while standing or stooping. If you have a 2-piece system, you can snap off the pouch to empty it. Remember that your stoma may function during this time. If you wish to rinse your pouch after you empty it, a turkey baster can be helpful. When using a baster, squirt water up into the pouch through the opening at the bottom. With a 2-piece system, you can snap off the pouch to rinse it. After rinsing  your pouch, empty it into the toilet. When rinsing your pouch at home, put a few granules of Dreft soap in the rinse water. This helps lubricate and freshen your pouch. The inside of your pouch can be sprayed with non-stick cooking oil (Pam spray). This may help reduce stool sticking to the inside of the pouch.  Bathing You may shower or bathe with your pouch on or off. Remember that your stoma may function during this time.  The materials you use to wash your stoma and the skin around it should be clean, but they do not need to be sterile.  Wearing Your Pouch During hot weather, or if you perspire a lot in general, wear a cover over your pouch. This may prevent a rash on your skin under the pouch. Pouch covers are  sold at ostomy supply stores. Wear the pouch inside your underwear for better support. Watch your weight. Any gain or loss of 10 to 15 pounds or more can change the way your pouch fits.  Going Away From Home A collapsible cup (like those that come in travel kits) or a soft plastic squirt bottle with a pull-up top (like a travel bottle for shampoo) can be used for rinsing your pouch when you are away from home. Tilt the opening of the pouch at an upward angle when using a cup to rinse.  Carry wet wipes or extra tissues to use in public bathrooms.  Carry an extra pouching system with you at all times.  Never keep ostomy supplies in the glove compartment of your car. Extreme heat or cold can damage the skin barriers and adhesive wafers on the pouch.  When you travel, carry your ostomy supplies with you at all times. Keep them within easy reach. Do not pack ostomy supplies in baggage that will be checked or otherwise separated from you, because your baggage might be lost. If you're traveling out of the country, it is helpful to have a letter stating that you are carrying ostomy supplies as a medical necessity.  If you need ostomy supplies while traveling, look in the yellow pages of the telephone book under "Surgical Supplies." Or call the local ostomy organization to find out where supplies are available.  Do not let your ostomy supplies get low. Always order new pouches before you use the last one.  Reducing Odor Limit foods such as broccoli, cabbage, onions, fish, and garlic in your diet to help reduce odor. Each time you empty your pouch, carefully clean the opening of the pouch, both inside and outside, with toilet paper. Rinse your pouch 1 or 2 times daily after you empty it (see directions for emptying your pouch and going away from home). Add deodorant (such as Super Banish or Nullo) to your pouch. Use air deodorizers in your bathroom. Do not add aspirin to your pouch. Even though  aspirin can help prevent odor, it   could cause ulcers on your stoma.  When to call the doctor Call the doctor if you have any of the following symptoms: Purple, black, or white stoma Severe cramps lasting more than 6 hours Severe watery discharge from the stoma lasting more than 6 hours No output from the colostomy for 3 days Excessive bleeding from your stoma Swelling of your stoma to more than 1/2-inch larger than usual Pulling inward of your stoma below skin level Severe skin irritation or deep ulcers Bulging or other changes in your abdomen  When to call your ostomy nurse Call your ostomy/enterostomal therapy (WOCN) nurse if any of the following occurs: Frequent leaking of your pouching system Change in size or appearance of your stoma, causing discomfort or problems with your pouch Skin rash or rawness Weight gain or loss that causes problems with your pouch     FREQUENTLY ASKED QUESTIONS   Why haven't you met any of these folks who have an ostomy?  Well, maybe you have! You just did not recognize them because an ostomy doesn't show. It can be kept secret if you wish. Why, maybe some of your best friends, office associates or neighbors have an ostomy ... you never can tell. People facing ostomy surgery have many quality-of-life questions like: Will you bulge? Smell? Make noises? Will you feel waste leaving your body? Will you be a captive of the toilet? Will you starve? Be a social outcast? Get/stay married? Have babies? Easily bathe, go swimming, bend over?  OK, let's look at what you can expect:   Will you bulge?  Remember, without part of the intestine or bladder, and its contents, you should have a flatter tummy than before. You can expect to wear, with little exception, what you wore before surgery ... and this in-cludes tight clothing and bathing suits.   Will you smell?  Today, thanks to modern odor proof pouching systems, you can walk into an ostomy support group  meeting and not smell anything that is foul or offensive. And, for those with an ileostomy or colostomy who are concerned about odor when emptying their pouch, there are in-pouch deodorants that can be used to eliminate any waste odors that may exist.   Will you make noises?  Everyone produces gas, especially if they are an air-swallower. But intestinal sounds that occur from time to time are no differ-ent than a gurgling tummy, and quite often your clothing will muffle any sounds.   Will you feel the waste discharges?  For those with a colostomy or ileostomy there might be a slight pressure when waste leaves your body, but understand that the intestines have no nerve endings, so there will be no unpleasant sensations. Those with a urostomy will probably be unaware of any kidney drainage.   Will you be a captive of the toilet?  Immediately post-op you will spend more time in the bathroom than you will after your body recovers from surgery. Every person is different, but on average those with an ileostomy or urostomy may empty their pouches 4 to 6 times a day; a little  less if you have a colostomy. The average wear time between pouch system changes is 3 to 5 days and the changing process should take less than 30 minutes.   Will I need to be on a special diet? Most people return to their normal diet when they have recovered from surgery. Be sure to chew your food well, eat a well-balanced diet and drink plenty of fluids. If   you experience problems with a certain food, wait a couple of weeks and try it again.  Will there be odor and noises? Pouching systems are designed to be odor-proof or odor-resistant. There are deodorants that can be used in the pouch. Medications are also available to help reduce odor. Limit gas-producing foods and carbonated beverages. You will experience less gas and fewer noises as you heal from surgery.  How much time will it take to care for my ostomy? At first, you may  spend a lot of time learning about your ostomy and how to take care of it. As you become more comfortable and skilled at changing the pouching system, it will take very little time to care for it.   Will I be able to return to work? People with ostomies can perform most jobs. As soon as you have healed from surgery, you should be able to return to work. Heavy lifting (more than 10 pounds) may be discouraged.   What about intimacy? Sexual relationships and intimacy are important and fulfilling aspects of your life. They should continue after ostomy surgery. Intimacy-related concerns should be discussed openly between you and your partner.   Can I wear regular clothing? You do not need to wear special clothing. Ostomy pouches are fairly flat and barely noticeable. Elastic undergarments will not hurt the stoma or prevent the ostomy from functioning.   Can I participate in sports? An ostomy should not limit your involvement in sports. Many people with ostomies are runners, skiers, swimmers or participate in other active lifestyles. Talk with your caregiver first before doing heavy physical activity.  Will you starve?  Not if you follow doctor's orders at each stage of your post-op adjustment. There is no such thing as an "ostomy diet". Some people with an ostomy will be able to eat and tolerate anything; others may find diffi-culty with some foods. Each person is an individual and must determine, by trial, what is best for them. A good practice for all is to drink plenty of water.   Will you be a social outcast?  Have you met anyone who has an ostomy and is a social outcast? Why should you be the first? Only your attitude and self image will effect how you are treated. No confi-dent person is an outcast.    PROFESSIONAL HELP   Resources are available if you need help or have questions about your ostomy.   Specially trained nurses called Wound, Ostomy Continence Nurses (WOCN) are available for  consultation in most major medical centers.  Consider getting an ostomy consult at an outpatient ostomy clinic.   [place] has an Ostomy Clinic run by an WOCN ostomy nurse at the [facility].  [phone]. [facility] can help set up an appointment   The [employer] Ostomy Association (UOA) is a group made up of many local chapters throughout the [place]. These local groups hold meetings and provide support to prospective and existing ostomates. They sponsor educational events and have qualified visitors to make personal or telephone visits. Contact the UOA for the chapter nearest you and for other educational publications.  More detailed information can be found in Colostomy Guide, a publication of the United Ostomy Association (UOA). Contact UOA at 1-800-826-0826 or visit their web site at www.uoaa.org. The website contains links to other sites, suppliers and resources.  [employer] Secure Start Services: Start at the website to enlist for support.  Your Wound Ostomy (WOCN) nurse may have started this   process. https://www.[employer].com/en/securestart Secure Start services are designed to support people as they live their lives with an ostomy or neurogenic bladder. Enrolling is easy and at no cost to the patient. We realize that each person's needs and life journey are different. Through Secure Start services, we want to help people live their life, their way.  #######################################################  

## 2021-11-19 NOTE — Discharge Summary (Signed)
Physician Discharge Summary  Patient ID: George Watkins MRN: [id number] DOB/AGE: 03/18/1951 71 y.o.  Admit date: 11/17/2021 Discharge date: 11/19/2021  Admission Diagnoses: Colon cancer Discharge Diagnoses:  Principal Problem:   Colon cancer ([place])  -metastasized to regional lymph nodes  Discharged Condition: good  Hospital Course: Patient was admitted to the med surg floor after surgery.  Diet was advanced as tolerated.  Patient began to have bowel function on postop day 1.  By postop day 2, he was tolerating a solid diet and pain was controlled with oral medications.  He was urinating without difficulty and ambulating without assistance.  Patient was felt to be in stable condition for discharge to home.   Consults: None  Significant Diagnostic Studies: labs: cbc, bmet  Treatments: IV hydration, analgesia: acetaminophen and Tramadol, and surgery: robotic R colectomy  Discharge Exam: Blood pressure 132/76, pulse 63, temperature 98.3 F (36.8 C), temperature source Oral, resp. rate 18, height 5\' 9"  (1.753 m), weight 98.6 kg, SpO2 97 %. General appearance: alert and cooperative GI: normal findings: soft, non-tender Incision/Wound: clean, dry, intact  Disposition: Discharge disposition: 01-Home or Self Care        Allergies as of 11/19/2021       Reactions   Sulfa Antibiotics Itching        Medication List     TAKE these medications    amLODipine 5 MG tablet Commonly known as: NORVASC Take 5 mg by mouth daily.   atorvastatin 10 MG tablet Commonly known as: LIPITOR Take 10 mg by mouth at bedtime.   cholecalciferol 25 MCG (1000 UNIT) tablet Commonly known as: VITAMIN D3 Take 1,000 Units by mouth daily.   cloNIDine 0.2 MG tablet Commonly known as: CATAPRES Take 0.2 mg by mouth 2 (two) times daily.   cyclobenzaprine 5 MG tablet Commonly known as: FLEXERIL Take 5 mg by mouth at bedtime as needed.   ferrous sulfate 325 (65 FE) MG tablet Take 1 tablet (325  mg total) by mouth 2 (two) times daily with a meal.   labetalol 200 MG tablet Commonly known as: NORMODYNE Take 200 mg by mouth 2 (two) times daily.   losartan-hydrochlorothiazide 100-25 MG tablet Commonly known as: HYZAAR Take 1 tablet by mouth daily.   MULTIVITAMIN MEN 50+ PO Take 1 tablet by mouth daily.   pantoprazole 40 MG tablet Commonly known as: PROTONIX Take 40 mg by mouth daily.   traMADol 50 MG tablet Commonly known as: ULTRAM Take 1-2 tablets (50-100 mg total) by mouth every 6 (six) hours as needed for moderate pain.   triamcinolone ointment 0.1 % Commonly known as: KENALOG Apply 1 application topically daily as needed (irritation).        Follow-up Information     Leighton Ruff, MD. Schedule an appointment as soon as possible for a visit in 2 week(s).   Specialties: General Surgery, Colon and Rectal Surgery Contact information: [place] [address] [phone]                 Signed: Rosario Adie 5/36/6440, 7:58 AM

## 2021-11-24 ENCOUNTER — Other Ambulatory Visit: Payer: Self-pay

## 2021-11-24 ENCOUNTER — Ambulatory Visit: Payer: Self-pay | Admitting: General Surgery

## 2021-11-24 ENCOUNTER — Other Ambulatory Visit (HOSPITAL_COMMUNITY): Payer: Self-pay

## 2021-11-24 NOTE — Progress Notes (Signed)
The proposed treatment discussed in conference is for discussion purpose only and is not a binding recommendation.  The patients have not been physically examined, or presented with their treatment options.  Therefore, final treatment plans cannot be decided.  

## 2021-11-25 DIAGNOSIS — C19 Malignant neoplasm of rectosigmoid junction: Secondary | ICD-10-CM

## 2021-11-25 NOTE — Progress Notes (Signed)
I spoke with Mr and Mrs Wainwright and relayed the recommendations for GI Conference yesterday.  He is agreeable to an IR u/s guided biopsy of his lymphadenopathy. I told them that our radiology department will call with his appt and instructions. All questions were answered.  They verbalized understanding. Order placed per Dr Burr Medico.

## 2021-11-26 NOTE — Patient Instructions (Addendum)
DUE TO COVID-19 ONLY ONE VISITOR IS ALLOWED TO COME WITH YOU AND STAY IN THE WAITING ROOM ONLY DURING PRE OP AND PROCEDURE.   **NO VISITORS ARE ALLOWED IN THE SHORT STAY AREA OR RECOVERY ROOM!!**       Your procedure is scheduled on: 12/09/21   Report to [facility] Main Entrance    Report to admitting at 5:15 AM   Call this number if you have problems the morning of surgery [phone]   Do not eat food or drink liquids :After Midnight.   May have sip of water with medications  FOLLOW BOWEL PREP AND ANY ADDITIONAL PRE OP INSTRUCTIONS YOU RECEIVED FROM YOUR SURGEON'S OFFICE!!!     Oral Hygiene is also important to reduce your risk of infection.                                    Remember - BRUSH YOUR TEETH THE MORNING OF SURGERY WITH YOUR REGULAR TOOTHPASTE   Do NOT smoke after Midnight   Stop all vitamins and supplements 7 days before surgery   Take these medicines the morning of surgery with A SIP OF WATER: Amlodipine, Atorvastatin, Clonidine, Labetalol, Pantoprazole, Tramadol.                               You may not have any metal on your body including jewelry, and body piercing             Do not wear lotions, powders, cologne, or deodorant              Men may shave face and neck.   Do not bring valuables to the hospital. [place].   Contacts, dentures or bridgework may not be worn into surgery.    Patients discharged on the day of surgery will not be allowed to drive home.  Someone needs to stay with you for the first 24 hours after anesthesia.              Please read over the following fact sheets you were given: IF YOU HAVE QUESTIONS ABOUT YOUR PRE-OP INSTRUCTIONS PLEASE CALL [place] - Preparing for Surgery Before surgery, you can play an important role.  Because skin is not sterile, your skin needs to be as free of germs as possible.  You can reduce the number of germs on  your skin by washing with CHG (chlorahexidine gluconate) soap before surgery.  CHG is an antiseptic cleaner which kills germs and bonds with the skin to continue killing germs even after washing. Please DO NOT use if you have an allergy to CHG or antibacterial soaps.  If your skin becomes reddened/irritated stop using the CHG and inform your nurse when you arrive at Short Stay. Do not shave (including legs and underarms) for at least 48 hours prior to the first CHG shower.  You may shave your face/neck.  Please follow these instructions carefully:  1.  Shower with CHG Soap the night before surgery and the  morning of surgery.  2.  If you choose to wash your hair, wash your hair first as usual with your normal  shampoo.  3.  After you shampoo, rinse your hair and body  thoroughly to remove the shampoo.                             4.  Use CHG as you would any other liquid soap.  You can apply chg directly to the skin and wash.  Gently with a scrungie or clean washcloth.  5.  Apply the CHG Soap to your body ONLY FROM THE NECK DOWN.   Do   not use on face/ open                           Wound or open sores. Avoid contact with eyes, ears mouth and   genitals (private parts).                       Wash face,  Genitals (private parts) with your normal soap.             6.  Wash thoroughly, paying special attention to the area where your    surgery  will be performed.  7.  Thoroughly rinse your body with warm water from the neck down.  8.  DO NOT shower/wash with your normal soap after using and rinsing off the CHG Soap.                9.  Pat yourself dry with a clean towel.            10.  Wear clean pajamas.            11.  Place clean sheets on your bed the night of your first shower and do not  sleep with pets. Day of Surgery : Do not apply any lotions/deodorants the morning of surgery.  Please wear clean clothes to the hospital/surgery center.  FAILURE TO FOLLOW THESE INSTRUCTIONS MAY RESULT IN THE  CANCELLATION OF YOUR SURGERY  PATIENT SIGNATURE_________________________________  NURSE SIGNATURE__________________________________  ________________________________________________________________________

## 2021-11-26 NOTE — Progress Notes (Addendum)
COVID swab appointment: n/a  COVID Vaccine Completed: yes x3 Date COVID Vaccine completed: 01/29/20, 02/16/20 Has received booster: 10/20/20 COVID vaccine manufacturer: Pfizer      Date of COVID positive in last 90 days: no  PCP - Arthur Holms, NP Cardiologist -   Chest x-ray - CT 10/21/21 Epic EKG - 11/09/21 Epic Stress Test - 2012 ECHO - years ago per pt Cardiac Cath - years ago per pt 8-10 Pacemaker/ICD device last checked: n/a Spinal Cord Stimulator: n/a  Bowel Prep - n/a  Sleep Study - yes, positive CPAP - no  Fasting Blood Sugar - n/a Checks Blood Sugar _____ times a day  Blood Thinner Instructions: n/a Aspirin Instructions: Last Dose:  Activity level: Can go up a flight of stairs and perform activities of daily living without stopping and without symptoms of chest pain or shortness of breath.     Anesthesia review: CAD, OSA, heart murmur, anemia, HTN, colorectal cancer  Patient denies shortness of breath, fever, cough and chest pain at PAT appointment   Patient verbalized understanding of instructions that were given to them at the PAT appointment. Patient was also instructed that they will need to review over the PAT instructions again at home before surgery.

## 2021-11-29 ENCOUNTER — Other Ambulatory Visit: Payer: Self-pay

## 2021-11-29 ENCOUNTER — Encounter (HOSPITAL_COMMUNITY): Payer: Self-pay

## 2021-11-29 ENCOUNTER — Encounter (HOSPITAL_COMMUNITY)
Admission: RE | Admit: 2021-11-29 | Discharge: 2021-11-29 | Disposition: A | Discharge status: Home / Self Care | Payer: Medicare PPO | Source: Ambulatory Visit | Attending: General Surgery | Admitting: General Surgery

## 2021-11-29 DIAGNOSIS — Z01818 Encounter for other preprocedural examination: Secondary | ICD-10-CM | POA: Diagnosis not present

## 2021-11-29 NOTE — Progress Notes (Signed)
George Daft, MD  [place], Mallary Kreger M US guided left axillary lymph node biopsy.  See PET CT   [place]        Previous Messages   ----- Message -----  From: Valli Glance  Sent: 11/25/2021   5:22 PM EST  To: [facility] Procedure Requests  Subject: [place] AXILLARY NODE CORE BIOPSY LEFT               [place] AXILLARY NODE CORE BIOPSY LEFT        Reason: Colorectal cancer         History: PET and CT in computer           Provider:Feng, Krista Blue           Contact:[phone]

## 2021-12-02 ENCOUNTER — Other Ambulatory Visit: Payer: Self-pay

## 2021-12-02 DIAGNOSIS — C19 Malignant neoplasm of rectosigmoid junction: Secondary | ICD-10-CM

## 2021-12-03 ENCOUNTER — Inpatient Hospital Stay: Payer: Medicare PPO

## 2021-12-03 ENCOUNTER — Inpatient Hospital Stay: Payer: Medicare PPO | Attending: Physician Assistant | Admitting: Hematology

## 2021-12-03 ENCOUNTER — Other Ambulatory Visit: Payer: Self-pay

## 2021-12-03 ENCOUNTER — Encounter: Payer: Self-pay | Admitting: Hematology

## 2021-12-03 VITALS — BP 126/72 | HR 65 | Temp 98.4°F | Resp 18 | Wt 223.1 lb

## 2021-12-03 DIAGNOSIS — C183 Malignant neoplasm of hepatic flexure: Secondary | ICD-10-CM | POA: Diagnosis not present

## 2021-12-03 DIAGNOSIS — D509 Iron deficiency anemia, unspecified: Secondary | ICD-10-CM | POA: Insufficient documentation

## 2021-12-03 DIAGNOSIS — C19 Malignant neoplasm of rectosigmoid junction: Secondary | ICD-10-CM | POA: Diagnosis not present

## 2021-12-03 DIAGNOSIS — C182 Malignant neoplasm of ascending colon: Secondary | ICD-10-CM | POA: Insufficient documentation

## 2021-12-03 DIAGNOSIS — I10 Essential (primary) hypertension: Secondary | ICD-10-CM | POA: Diagnosis not present

## 2021-12-03 LAB — CBC WITH DIFFERENTIAL (CANCER CENTER ONLY)
Abs Immature Granulocytes: 0.03 10*3/uL (ref 0.00–0.07)
Basophils Absolute: 0.1 10*3/uL (ref 0.0–0.1)
Basophils Relative: 1 %
Eosinophils Absolute: 0.2 10*3/uL (ref 0.0–0.5)
Eosinophils Relative: 3 %
HCT: 35.2 % — ABNORMAL LOW (ref 39.0–52.0)
Hemoglobin: 10.8 g/dL — ABNORMAL LOW (ref 13.0–17.0)
Immature Granulocytes: 0 %
Lymphocytes Relative: 20 %
Lymphs Abs: 1.6 10*3/uL (ref 0.7–4.0)
MCH: 26.4 pg (ref 26.0–34.0)
MCHC: 30.7 g/dL (ref 30.0–36.0)
MCV: 86.1 fL (ref 80.0–100.0)
Monocytes Absolute: 0.7 10*3/uL (ref 0.1–1.0)
Monocytes Relative: 9 %
Neutro Abs: 5.7 10*3/uL (ref 1.7–7.7)
Neutrophils Relative %: 67 %
Platelet Count: 263 10*3/uL (ref 150–400)
RBC: 4.09 MIL/uL — ABNORMAL LOW (ref 4.22–5.81)
RDW: 13.9 % (ref 11.5–15.5)
WBC Count: 8.4 10*3/uL (ref 4.0–10.5)
nRBC: 0 % (ref 0.0–0.2)

## 2021-12-03 LAB — CMP (CANCER CENTER ONLY)
ALT: 27 U/L (ref 0–44)
AST: 22 U/L (ref 15–41)
Albumin: 3.7 g/dL (ref 3.5–5.0)
Alkaline Phosphatase: 71 U/L (ref 38–126)
Anion gap: 6 (ref 5–15)
BUN: 11 mg/dL (ref 8–23)
CO2: 30 mmol/L (ref 22–32)
Calcium: 9 mg/dL (ref 8.9–10.3)
Chloride: 105 mmol/L (ref 98–111)
Creatinine: 1.28 mg/dL — ABNORMAL HIGH (ref 0.61–1.24)
GFR, Estimated: >60 mL/min (ref >60)
Glucose, Bld: 205 mg/dL — ABNORMAL HIGH (ref 70–99)
Potassium: 3.6 mmol/L (ref 3.5–5.1)
Sodium: 141 mmol/L (ref 135–145)
Total Bilirubin: 0.7 mg/dL (ref 0.3–1.2)
Total Protein: 7.2 g/dL (ref 6.5–8.1)

## 2021-12-03 LAB — SAMPLE TO BLOOD BANK

## 2021-12-03 MED ORDER — ONDANSETRON HCL 8 MG PO TABS: 8.0000 mg | ORAL_TABLET | Freq: Two times a day (BID) | ORAL | 1 refills | Status: DC | PRN

## 2021-12-03 MED ORDER — LIDOCAINE-PRILOCAINE 2.5-2.5 % EX CREA: TOPICAL_CREAM | CUTANEOUS | 3 refills | Status: DC

## 2021-12-03 MED ORDER — PROCHLORPERAZINE MALEATE 10 MG PO TABS: 10.0000 mg | ORAL_TABLET | Freq: Four times a day (QID) | ORAL | 1 refills | Status: DC | PRN

## 2021-12-03 NOTE — Progress Notes (Signed)
George Watkins   [phone] [phone]   Clinic Follow up Note   Patient Care Team: Arthur Holms, NP as PCP - General (Nurse Practitioner) Truitt Merle, MD as Consulting Physician (Oncology)  Date of Service:  12/03/2021  CHIEF COMPLAINT: f/u of colorectal cancer  CURRENT THERAPY:  PENDING Adjuvant FOLFOX, to start 12/23/21  ASSESSMENT & PLAN:  George Watkins is a 71 y.o. male with   1. Colorectal cancer, adenocarcinoma of the ascending colon, pT3N2aMx, at least stage IIIB, MSS -Colonoscopy performed on 10/13/21 for IDA and abdominal pain showed a 4 cm mass near the hepatic flexure. Pathology confirmed adenocarcinoma, MMR normal. -Baseline CEA was elevated at 25.9 on 10/13/21 -CT C/A/P on 10/21/21 showed: circumferential mass in the mid ascending colon; mildly prominent, asymmetric right external iliac lymph node, nonspecific but suspicious.   -PET scan on 11/09/21 showed: hypermetabolism to colon mass and adjacent mildly enlarged ileocolonic mesenteric lymph nodes. Otherwise, no definitive evidence of additional spread. -partial colectomy on 11/17/21 with Dr. Marcello Moores showed 5.6 cm invasive moderately differentiated adenocarcinoma invading into pericolic adipose. Margins negative, but metastatic carcinoma in 6 lymph nodes (6/15). Two additional tubular adenomas without high-grade dysplasia noted. MMR normal and MSI stable. I reviewed the pathology results with them today. -given his multiple positive lymph nodes, I recommend adjuvant chemotherapy with FOLFOX for 6 months. --Chemotherapy consent: Side effects including but does not not limited to, fatigue, nausea, vomiting, diarrhea, hair loss, neuropathy, fluid retention, renal and kidney dysfunction, neutropenic fever, needed for blood transfusion, bleeding, were discussed with patient in great detail. He agrees to proceed. -he is scheduled for biopsy of the left axillary lymph node on 12/07/21. I will go over the  results with them when they see me for chemo. -he is scheduled for port placement on 2/16 with Dr. Marcello Moores. -we will try to schedule his treatment on 12/23/21, when they will already be here for his wife's appointment with me.   2.  Iron deficiency anemia  -Patient currently taking ferrous sulfate BID and compliant.  Had good response to oral iron supplement.  Has not required iron infusion or blood transfusion.  -hgb slowly improving since surgery, 10.8 today   3. Renal Lesion -incidental finding on CT CAP 10/21/21 -PET on 11/09/21 showed no metabolic activity, favored to reflect hemorrhagic/proteinaceous cysts. Plan to get renal US after his surgery      PLAN: -chemo education -lymph node biopsy 2/14 -port placement 2/16 -lab, flush, f/u, and chemo FOLFOX on 3/2 and 3/16   No problem-specific Assessment & Plan notes found for this encounter.   SUMMARY OF ONCOLOGIC HISTORY: Oncology History Overview Note   Cancer Staging  Colorectal cancer [place]) Staging form: Colon and Rectum, AJCC 8th Edition - Pathologic stage from 11/17/2021: Stage IIIB (pT3, pN2a, cM0) - Signed by Truitt Merle, MD on 12/03/2021    Colorectal cancer ([place])  10/13/2021 Procedure   Upper GI endoscopy by Dr. Ardis Hughs for IDA and abdominal pain  Findings:  - The esophagus was normal. - The stomach was normal. - The examined duodenum was normal.   10/13/2021 Procedure   Colonoscopy Indications: Iron deficiency  - A fungating and ulcerated partially obstructing, clearly malignant mass was found at the hepatic flexure. The mass was circumferential and measured 4cm in length. Biopsies were taken with a cold forceps for histology and then the mucosa just distal to the mass was tattooed with an injection of Spot (carbon black). jar 1 Findings: - Two pedunculated polyps  were found in the mid transverse colon. The polyps were 8 to 11 mm in size. These polyps were removed with a hot snare. Resection and retrieval  were complete. jar 2 - Five pedunculated and sessile polyps were found in the distal transverse colon. The polyps were 4 to 14 mm in size. Two were removed with hot snare, three were removed with cold snare. Resection and retrieval were complete. jar 3 - A 20 mm polyp was found in the distal transverse colon. The polyp was pedunculated. The polyp was removed with a hot snare. Resection and retrieval were complete. jar 4 - The exam was otherwise without abnormality on direct and retroflexion views.   10/13/2021 Pathology Results   REPORT OF SURGICAL PATHOLOGY Accession: [id number]  Diagnosis 1. Hepatic Flexure Biopsy - ADENOCARCINOMA, SEE NOTE   10/21/2021 Imaging   CT CHEST ABDOMEN PELVIS W CONTRAST  IMPRESSION: 1. There is a circumferential mass of the mid ascending colon measuring approximately 5 cm in length, in keeping with newly diagnosed primary colon malignancy. 2. Adjacent to the colon mass, there is mesenteric fat stranding suggesting invasion beyond the muscularis as well as abnormally enlarged mesocolonic lymph nodes. 3. Mildly prominent, asymmetric right external iliac lymph nodes, nonspecific although suspicious for early nodal metastatic disease. 4. Asymmetrically prominent left axillary lymph nodes, nonspecific, these nodes less favored to reflect metastatic disease given location in the absence of other evidence of metastatic disease above the diaphragm. Both external iliac and axillary lymph nodes could be characterized for abnormal metabolic activity by PET-CT. 5. No evidence of organ metastatic disease in the chest, abdomen, or pelvis. 6. Exophytic soft tissue attenuation lesion of the anterior midportion of the left kidney measuring 1.2 cm. Partially exophytic soft tissue attenuation lesion of the posteroinferior pole of the left kidney measuring 1.9 x 1.7 cm. These may reflect hemorrhagic or proteinaceous cysts but are incompletely characterized  and incidental renal cell carcinoma is not excluded. Recommend initial renal ultrasound to establish definitively cystic character, multiphasic contrast enhanced CT or MRI may however be further required to exclude malignancy. 7. Prostatomegaly with thickening of the decompressed urinary bladder wall, likely due to chronic outlet obstruction.   Aortic Atherosclerosis (ICD10-I70.0).   10/28/2021 Initial Diagnosis   Colorectal cancer ([place])   11/17/2021 Cancer Staging   Staging form: Colon and Rectum, AJCC 8th Edition - Pathologic stage from 11/17/2021: Stage IIIB (pT3, pN2a, cM0) - Signed by Truitt Merle, MD on 12/03/2021 Histologic grading system: 4 grade system Histologic grade (G): G2 Residual tumor (R): R0 - None    11/17/2021 Definitive Surgery   FINAL MICROSCOPIC DIAGNOSIS:   A. COLON, RIGHT, RESECTION:  Invasive moderately differentiated adenocarcinoma invading into  pericolic adipose (pT3)  Tumor measures 5.2 x 3.6 x 0.8 cm  Margins free of carcinoma and adenomatous change  Six of fifteen pericolic lymph nodes with metastatic carcinoma (6/15, pN2a)  Two additional tubular adenomas without high-grade dysplasia, largest measuring 1.3 cm  Benign appendix   ADDENDUM:  Mismatch Repair Protein (IHC)  SUMMARY INTERPRETATION: NORMAL  Microsatellite Instability (MSI) Result: STABLE   12/23/2021 -  Chemotherapy   Patient is on Treatment Plan : COLORECTAL FOLFOX q14d x 6 months        INTERVAL HISTORY:  George Watkins is here for a follow up of colorectal cancer. He was last seen by me on 10/30/21 in consultation with phone visit in the interim. He presents to the clinic accompanied by his wife. He reports surgery went well. He  adds that he is able to eat more now.    All other systems were reviewed with the patient and are negative.  MEDICAL HISTORY:  Past Medical History:  Diagnosis Date   Anemia    Arthritis    Colon cancer ([place])    Coronary artery disease    GERD  (gastroesophageal reflux disease)    Heart murmur    Hiatal hernia    Hypertension    Pneumonia    Sleep apnea     SURGICAL HISTORY: Past Surgical History:  Procedure Laterality Date   ANKLE SURGERY     CIRCUMCISION     COLONOSCOPY     HERNIA REPAIR      I have reviewed the social history and family history with the patient and they are unchanged from previous note.  ALLERGIES:  is allergic to sulfa antibiotics.  MEDICATIONS:  Current Outpatient Medications  Medication Sig Dispense Refill   amLODipine (NORVASC) 5 MG tablet Take 5 mg by mouth daily.     atorvastatin (LIPITOR) 10 MG tablet Take 10 mg by mouth at bedtime.     cholecalciferol (VITAMIN D3) 25 MCG (1000 UNIT) tablet Take 1,000 Units by mouth daily.     cloNIDine (CATAPRES) 0.2 MG tablet Take 0.2 mg by mouth 2 (two) times daily.     cyclobenzaprine (FLEXERIL) 5 MG tablet Take 5 mg by mouth at bedtime as needed for muscle spasms.     ferrous sulfate 325 (65 FE) MG tablet Take 1 tablet (325 mg total) by mouth 2 (two) times daily with a meal. 30 tablet 0   labetalol (NORMODYNE) 200 MG tablet Take 200 mg by mouth 2 (two) times daily.     lidocaine-prilocaine (EMLA) cream Apply to affected area once 30 g 3   losartan-hydrochlorothiazide (HYZAAR) 100-25 MG tablet Take 1 tablet by mouth daily.     Multiple Vitamins-Minerals (MULTIVITAMIN MEN 50+ PO) Take 1 tablet by mouth daily.     ondansetron (ZOFRAN) 8 MG tablet Take 1 tablet (8 mg total) by mouth 2 (two) times daily as needed for refractory nausea / vomiting. Start on day 3 after chemotherapy. 30 tablet 1   pantoprazole (PROTONIX) 40 MG tablet Take 40 mg by mouth daily.     prochlorperazine (COMPAZINE) 10 MG tablet Take 1 tablet (10 mg total) by mouth every 6 (six) hours as needed (Nausea or vomiting). 30 tablet 1   traMADol (ULTRAM) 50 MG tablet Take 1-2 tablets (50-100 mg total) by mouth every 6 (six) hours as needed for moderate pain. 30 tablet 0   No current  facility-administered medications for this visit.    PHYSICAL EXAMINATION: ECOG PERFORMANCE STATUS: 1 - Symptomatic but completely ambulatory  Vitals:   12/03/21 1426  BP: 126/72  Pulse: 65  Resp: 18  Temp: 98.4 F (36.9 C)  SpO2: 97%   Wt Readings from Last 3 Encounters:  12/03/21 223 lb 2 oz (101.2 kg)  11/18/21 217 lb 6 oz (98.6 kg)  11/09/21 217 lb 12.8 oz (98.8 kg)     GENERAL:alert, no distress and comfortable SKIN: skin color, texture, turgor are normal, no rashes or significant lesions EYES: normal, Conjunctiva are pink and non-injected, sclera clear  LYMPH:  no palpable lymphadenopathy in the cervical, axillary  LUNGS: clear to auscultation and percussion with normal breathing effort HEART: regular rate & rhythm and no murmurs and no lower extremity edema ABDOMEN:abdomen soft, non-tender and normal bowel sounds Musculoskeletal:no cyanosis of digits and no clubbing  NEURO:  alert & oriented x 3 with fluent speech, no focal motor/sensory deficits  LABORATORY DATA:  I have reviewed the data as listed CBC Latest Ref Rng & Units 12/03/2021 11/19/2021 11/18/2021  WBC 4.0 - 10.5 K/uL 8.4 10.2 13.3(H)  Hemoglobin 13.0 - 17.0 g/dL 10.8(L) 10.4(L) 10.5(L)  Hematocrit 39.0 - 52.0 % 35.2(L) 34.5(L) 33.7(L)  Platelets 150 - 400 K/uL 263 227 227     CMP Latest Ref Rng & Units 12/03/2021 11/19/2021 11/18/2021  Glucose 70 - 99 mg/dL 205(H) 113(H) 154(H)  BUN 8 - 23 mg/dL _0 Creatinine 0.61 - 1.24 mg/dL 1.28(H) 1.07 1.16  Sodium 135 - 145 mmol/L 141 137 134(L)  Potassium 3.5 - 5.1 mmol/L 3.6 3.9 3.7  Chloride 98 - 111 mmol/L 105 102 101  CO2 22 - 32 mmol/L _1 Calcium 8.9 - 10.3 mg/dL 9.0 8.3(L) 8.4(L)  Total Protein 6.5 - 8.1 g/dL 7.2 - -  Total Bilirubin 0.3 - 1.2 mg/dL 0.7 - -  Alkaline Phos 38 - 126 U/L 71 - -  AST 15 - 41 U/L 22 - -  ALT 0 - 44 U/L 27 - -      RADIOGRAPHIC STUDIES: I have personally reviewed the radiological images as listed and agreed  with the findings in the report. No results found.    No orders of the defined types were placed in this encounter.  All questions were answered. The patient knows to call the clinic with any problems, questions or concerns. No barriers to learning was detected. The total time spent in the appointment was 40 minutes.     Truitt Merle, MD 12/03/2021   I, Wilburn Mylar, am acting as scribe for Truitt Merle, MD.   I have reviewed the above documentation for accuracy and completeness, and I agree with the above.

## 2021-12-03 NOTE — Progress Notes (Signed)
START ON PATHWAY REGIMEN - Colorectal     A cycle is every 14 days:     Oxaliplatin      Leucovorin      Fluorouracil      Fluorouracil   **Always confirm dose/schedule in your pharmacy ordering system**  Patient Characteristics: Postoperative without Neoadjuvant Therapy (Pathologic Staging), Colon, Stage III, High Risk (pT4 or pN2) Tumor Location: Colon Therapeutic Status: Postoperative without Neoadjuvant Therapy (Pathologic Staging) AJCC M Category: cM0 AJCC T Category: pT3 AJCC N Category: pN2a AJCC 8 Stage Grouping: IIIB Intent of Therapy: Curative Intent, Discussed with Patient

## 2021-12-05 ENCOUNTER — Other Ambulatory Visit: Payer: Self-pay | Admitting: Radiology

## 2021-12-07 ENCOUNTER — Other Ambulatory Visit: Payer: Self-pay

## 2021-12-07 ENCOUNTER — Ambulatory Visit (HOSPITAL_COMMUNITY)
Admission: RE | Admit: 2021-12-07 | Discharge: 2021-12-07 | Disposition: A | Discharge status: Home / Self Care | Payer: Medicare PPO | Source: Ambulatory Visit | Attending: Hematology | Admitting: Hematology

## 2021-12-07 ENCOUNTER — Other Ambulatory Visit: Payer: Self-pay | Admitting: Hematology

## 2021-12-07 ENCOUNTER — Encounter (HOSPITAL_COMMUNITY): Payer: Self-pay

## 2021-12-07 DIAGNOSIS — M1990 Unspecified osteoarthritis, unspecified site: Secondary | ICD-10-CM | POA: Insufficient documentation

## 2021-12-07 DIAGNOSIS — K219 Gastro-esophageal reflux disease without esophagitis: Secondary | ICD-10-CM | POA: Insufficient documentation

## 2021-12-07 DIAGNOSIS — I2510 Atherosclerotic heart disease of native coronary artery without angina pectoris: Secondary | ICD-10-CM | POA: Insufficient documentation

## 2021-12-07 DIAGNOSIS — C19 Malignant neoplasm of rectosigmoid junction: Secondary | ICD-10-CM

## 2021-12-07 DIAGNOSIS — G4730 Sleep apnea, unspecified: Secondary | ICD-10-CM | POA: Insufficient documentation

## 2021-12-07 DIAGNOSIS — D649 Anemia, unspecified: Secondary | ICD-10-CM | POA: Insufficient documentation

## 2021-12-07 DIAGNOSIS — I10 Essential (primary) hypertension: Secondary | ICD-10-CM | POA: Diagnosis not present

## 2021-12-07 LAB — CBC WITH DIFFERENTIAL/PLATELET
Abs Immature Granulocytes: 0.02 10*3/uL (ref 0.00–0.07)
Basophils Absolute: 0 10*3/uL (ref 0.0–0.1)
Basophils Relative: 1 %
Eosinophils Absolute: 0.2 10*3/uL (ref 0.0–0.5)
Eosinophils Relative: 3 %
HCT: 36.2 % — ABNORMAL LOW (ref 39.0–52.0)
Hemoglobin: 11.2 g/dL — ABNORMAL LOW (ref 13.0–17.0)
Immature Granulocytes: 0 %
Lymphocytes Relative: 22 %
Lymphs Abs: 1.6 10*3/uL (ref 0.7–4.0)
MCH: 27.1 pg (ref 26.0–34.0)
MCHC: 30.9 g/dL (ref 30.0–36.0)
MCV: 87.7 fL (ref 80.0–100.0)
Monocytes Absolute: 0.7 10*3/uL (ref 0.1–1.0)
Monocytes Relative: 10 %
Neutro Abs: 4.4 10*3/uL (ref 1.7–7.7)
Neutrophils Relative %: 64 %
Platelets: 255 10*3/uL (ref 150–400)
RBC: 4.13 MIL/uL — ABNORMAL LOW (ref 4.22–5.81)
RDW: 14 % (ref 11.5–15.5)
WBC: 6.9 10*3/uL (ref 4.0–10.5)
nRBC: 0 % (ref 0.0–0.2)

## 2021-12-07 LAB — PROTIME-INR
INR: 1 (ref 0.8–1.2)
Prothrombin Time: 13.2 seconds (ref 11.4–15.2)

## 2021-12-07 MED ORDER — SODIUM CHLORIDE 0.9 % IV SOLN: INTRAVENOUS | Status: DC

## 2021-12-07 MED ORDER — LIDOCAINE HCL 1 % IJ SOLN
INTRAMUSCULAR | Status: AC
  Filled 2021-12-07: qty 20

## 2021-12-07 MED ORDER — FENTANYL CITRATE (PF) 100 MCG/2ML IJ SOLN
INTRAMUSCULAR | Status: AC
  Filled 2021-12-07: qty 2

## 2021-12-07 MED ORDER — MIDAZOLAM HCL 2 MG/2ML IJ SOLN
INTRAMUSCULAR | Status: AC
  Filled 2021-12-07: qty 2

## 2021-12-07 NOTE — Sedation Documentation (Signed)
No enlarged lymph node to biopsy per Dr Vernard Gambles. Patient transported back to Short Stay.

## 2021-12-07 NOTE — Consult Note (Signed)
Chief Complaint: Patient was seen in consultation today for image guided left axillary lymph node biopsy  Referring Physician(s): Feng,Yan  Supervising Physician: Arne Cleveland  Patient Status: [facility] - Out-pt  History of Present Illness: George Watkins is a 71 y.o. male with past medical history of anemia, arthritis, coronary artery disease, GERD, hypertension, sleep apnea, and newly diagnosed metastatic colon carcinoma.  Status post right colon resection on 11/17/2021.  Recent PET scan has revealed:  1. The known colon cancer involving the ascending colon is hypermetabolic, as well as the adjacent mildly enlarged ileocolonic mesenteric lymph nodes, consistent with nodal metastases. 2. No evidence of metastatic disease to the liver. 3. Small lower cervical, left subpectoral and left axillary lymph nodes are mildly hypermetabolic. These lymph nodes are nonspecific, and would be unlikely for metastatic colon cancer. The may be reactive. Attention on follow-up recommended. 4. Bilateral hyperdense renal lesions do not show any metabolic activity, and are favored to reflect hemorrhagic/proteinaceous cysts. Renal lesions are suboptimally evaluated by PET-CT. Management options include further evaluation with dedicated renal MRI (pre and post-contrast imaging) or inclusion of pre contrast images of the kidneys on CT follow-up within 6 months   He presents today for image guided left axillary lymph node biopsy for further evaluation.   Past Medical History:  Diagnosis Date   Anemia    Arthritis    Colon cancer ([place])    Coronary artery disease    GERD (gastroesophageal reflux disease)    Heart murmur    Hiatal hernia    Hypertension    Pneumonia    Sleep apnea     Past Surgical History:  Procedure Laterality Date   ANKLE SURGERY     CIRCUMCISION     COLONOSCOPY     HERNIA REPAIR      Allergies: Sulfa antibiotics  Medications: Prior to Admission medications    Medication Sig Start Date End Date Taking? Authorizing Provider  amLODipine (NORVASC) 5 MG tablet Take 5 mg by mouth daily. 08/06/20   [provider]  atorvastatin (LIPITOR) 10 MG tablet Take 10 mg by mouth at bedtime.    [provider]  cholecalciferol (VITAMIN D3) 25 MCG (1000 UNIT) tablet Take 1,000 Units by mouth daily.    [provider]  cloNIDine (CATAPRES) 0.2 MG tablet Take 0.2 mg by mouth 2 (two) times daily.    [provider]  cyclobenzaprine (FLEXERIL) 5 MG tablet Take 5 mg by mouth at bedtime as needed for muscle spasms.    [provider]  ferrous sulfate 325 (65 FE) MG tablet Take 1 tablet (325 mg total) by mouth 2 (two) times daily with a meal. 05/22/20   Sherwood Gambler, MD  labetalol (NORMODYNE) 200 MG tablet Take 200 mg by mouth 2 (two) times daily.    [provider]  lidocaine-prilocaine (EMLA) cream Apply to affected area once 12/03/21   Truitt Merle, MD  losartan-hydrochlorothiazide (HYZAAR) 100-25 MG tablet Take 1 tablet by mouth daily.    [provider]  Multiple Vitamins-Minerals (MULTIVITAMIN MEN 50+ PO) Take 1 tablet by mouth daily.    [provider]  ondansetron (ZOFRAN) 8 MG tablet Take 1 tablet (8 mg total) by mouth 2 (two) times daily as needed for refractory nausea / vomiting. Start on day 3 after chemotherapy. 12/03/21   Truitt Merle, MD  pantoprazole (PROTONIX) 40 MG tablet Take 40 mg by mouth daily. 06/11/20   [provider]  prochlorperazine (COMPAZINE) 10 MG tablet Take  1 tablet (10 mg total) by mouth every 6 (six) hours as needed (Nausea or vomiting). 12/03/21   Truitt Merle, MD  traMADol (ULTRAM) 50 MG tablet Take 1-2 tablets (50-100 mg total) by mouth every 6 (six) hours as needed for moderate pain. 03/20/40   Leighton Ruff, MD     Family History  Problem Relation Age of Onset   Healthy Mother    Healthy Father     Social History   Socioeconomic History   Marital status:  Married    Spouse name: Not on file   Number of children: Not on file   Years of education: Not on file   Highest education level: Not on file  Occupational History   Not on file  Tobacco Use   Smoking status: Some Days    Types: Cigars   Smokeless tobacco: Current    Types: Chew  Vaping Use   Vaping Use: Never used  Substance and Sexual Activity   Alcohol use: Yes    Comment: rare   Drug use: Not Currently   Sexual activity: Not on file  Other Topics Concern   Not on file  Social History Narrative   Not on file   Social Determinants of Health   Financial Resource Strain: Not on file  Food Insecurity: Not on file  Transportation Needs: Not on file  Physical Activity: Not on file  Stress: Not on file  Social Connections: Not on file      Review of Systems denies fever,HA,CP,dyspnea, cough, abd/back pain,N/V or bleeding  Vital Signs: BP 125/78, heart rate 55, temp 97.6, respirations 16, O2 sat 99%    Physical Exam awake/alert; chest- distant BS bilat; heart- sl bradycardic but reg rhythm; abd- soft,+BS,NT; trace bilat pretibial edema  Imaging: NM PET Image Initial (PI) Skull Base To Thigh  Result Date: 11/10/2021 CLINICAL DATA:  Initial treatment strategy for metastatic colon cancer. Indeterminate renal lesion. EXAM: NUCLEAR MEDICINE PET SKULL BASE TO THIGH TECHNIQUE: 10.66 mCi F-18 FDG was injected intravenously. Full-ring PET imaging was performed from the skull base to thigh after the radiotracer. CT data was obtained and used for attenuation correction and anatomic localization. Fasting blood glucose: 112 mg/dl COMPARISON:  CTs of the chest, abdomen and pelvis 10/21/2021 FINDINGS: Mediastinal blood pool activity: SUV max 2.5 NECK: There are several small mildly hypermetabolic lower cervical lymph nodes bilaterally demonstrating an SUV max of up to 4.5 on the left. These lymph nodes are not enlarged.There are no lesions of the pharyngeal mucosal space. Symmetric  activity within the lymphoid tissue of Waldeyer's ring is within physiologic limits. Incidental CT findings: Bilateral carotid atherosclerosis. CHEST: There are small hypermetabolic left axillary, subpectoral and supraclavicular lymph nodes. A in 8 mm left supraclavicular node on image 40/4 has an SUV max of 4.5. There is a 7 mm left axillary node on image 59/4 with an SUV max of 6.4. These nodes are similar in size to the recent CT. No other hypermetabolic mediastinal or hilar lymph nodes identified. No hypermetabolic pulmonary activity or suspicious nodularity. Incidental CT findings: Mild aortic and great vessel atherosclerosis. ABDOMEN/PELVIS: There is no hypermetabolic activity within the liver, adrenal glands, spleen or pancreas. There is focal hypermetabolic activity within the ascending colon associated with the irregular circumferential mass demonstrated previously. This has an SUV max of 9.9. Adjacent ileocolonic mesenteric lymph nodes are hypermetabolic, largest measuring 12 mm on image 123/4 (SUV max 2.8). No other hypermetabolic lymph nodes in the abdomen or pelvis. Specifically, the mildly  prominent external iliac lymph nodes do not show any increased metabolic activity. The previously demonstrated hyperdense renal lesions bilaterally do not demonstrate any metabolic activity and are likely hemorrhagic/proteinaceous cysts, although are suboptimally evaluated by PET-CT. Incidental CT findings: As above, hyperdense renal lesions bilaterally, unchanged from recent CT. Largest posteriorly in the lower pole of the left kidney measures 2.2 cm on image 127/4 and has a nonspecific density of 49 HU. Mild aortic and branch vessel atherosclerosis. SKELETON: There is no hypermetabolic activity to suggest osseous metastatic disease. Incidental CT findings: none IMPRESSION: 1. The known colon cancer involving the ascending colon is hypermetabolic, as well as the adjacent mildly enlarged ileocolonic mesenteric lymph  nodes, consistent with nodal metastases. 2. No evidence of metastatic disease to the liver. 3. Small lower cervical, left subpectoral and left axillary lymph nodes are mildly hypermetabolic. These lymph nodes are nonspecific, and would be unlikely for metastatic colon cancer. The may be reactive. Attention on follow-up recommended. 4. Bilateral hyperdense renal lesions do not show any metabolic activity, and are favored to reflect hemorrhagic/proteinaceous cysts. Renal lesions are suboptimally evaluated by PET-CT. Management options include further evaluation with dedicated renal MRI (pre and post-contrast imaging) or inclusion of pre contrast images of the kidneys on CT follow-up within 6 months. Electronically Signed   By: Richardean Sale M.D.   On: 11/10/2021 10:02    Labs:  CBC: Recent Labs    11/09/21 1422 11/18/21 0401 11/19/21 0432 12/03/21 1345  WBC 7.5 13.3* 10.2 8.4  HGB 10.5* 10.5* 10.4* 10.8*  HCT 34.6* 33.7* 34.5* 35.2*  PLT 262 227 227 263    COAGS: No results for input(s): INR, APTT in the last 8760 hours.  BMP: Recent Labs    11/09/21 1422 11/18/21 0401 11/19/21 0432 12/03/21 1345  NA 134* 134* 137 141  K 3.7 3.7 3.9 3.6  CL 101 101 102 105  CO2 28 28 30 30   GLUCOSE 109* 154* 113* 205*  BUN 17 14 17 11   CALCIUM 8.9 8.4* 8.3* 9.0  CREATININE 1.39* 1.16 1.07 1.28*  GFRNONAA 55* >60 >60 >60    LIVER FUNCTION TESTS: Recent Labs    10/13/21 1518 12/03/21 1345  BILITOT 1.2 0.7  AST 24 22  ALT 31 27  ALKPHOS 50 71  PROT 7.1 7.2  ALBUMIN 3.5 3.7    TUMOR MARKERS: Recent Labs    10/13/21 1518  CEA 25.9*    Assessment and Plan: 71 y.o. male with past medical history of anemia, arthritis, coronary artery disease, GERD, hypertension, sleep apnea, and newly diagnosed metastatic colon carcinoma.  Status post right colon resection on 11/17/2021.  Recent PET scan has revealed:  1. The known colon cancer involving the ascending colon is hypermetabolic, as  well as the adjacent mildly enlarged ileocolonic mesenteric lymph nodes, consistent with nodal metastases. 2. No evidence of metastatic disease to the liver. 3. Small lower cervical, left subpectoral and left axillary lymph nodes are mildly hypermetabolic. These lymph nodes are nonspecific, and would be unlikely for metastatic colon cancer. The may be reactive. Attention on follow-up recommended. 4. Bilateral hyperdense renal lesions do not show any metabolic activity, and are favored to reflect hemorrhagic/proteinaceous cysts. Renal lesions are suboptimally evaluated by PET-CT. Management options include further evaluation with dedicated renal MRI (pre and post-contrast imaging) or inclusion of pre contrast images of the kidneys on CT follow-up within 6 months   He presents today for image guided left axillary lymph node biopsy for further evaluation.Risks and benefits  of procedure was discussed with the patient including, but not limited to bleeding, infection, damage to adjacent structures or low yield requiring additional tests.  All of the questions were answered and there is agreement to proceed.  Consent signed and in chart.    Thank you for this interesting consult.  I greatly enjoyed meeting George Watkins and look forward to participating in their care.  A copy of this report was sent to the requesting provider on this date.  Electronically Signed: D. Rowe Robert, PA-C 12/07/2021, 11:25 AM   I spent a total of  20 minutes   in face to face in clinical consultation, greater than 50% of which was counseling/coordinating care for image guided left axillary lymph node biopsy

## 2021-12-08 NOTE — Anesthesia Preprocedure Evaluation (Addendum)
Anesthesia Evaluation  Patient identified by MRN, date of birth, ID band Patient awake    Reviewed: Allergy & Precautions, NPO status , Patient's Chart, lab work & pertinent test results, reviewed documented beta blocker date and time   History of Anesthesia Complications Negative for: history of anesthetic complications  Airway Mallampati: II  TM Distance: >3 FB Neck ROM: Full    Dental  (+) Edentulous Upper, Dental Advisory Given   Pulmonary sleep apnea , Current Smoker and Patient abstained from smoking.,    Pulmonary exam normal        Cardiovascular hypertension, Pt. on medications and Pt. on home beta blockers + CAD  Normal cardiovascular exam     Neuro/Psych negative neurological ROS  negative psych ROS   GI/Hepatic Neg liver ROS, hiatal hernia, GERD  Medicated and Controlled, Colon cancer    Endo/Other   Obesity   Renal/GU Renal InsufficiencyRenal disease     Musculoskeletal  (+) Arthritis ,   Abdominal   Peds  Hematology  (+) Blood dyscrasia, anemia ,   Anesthesia Other Findings   Reproductive/Obstetrics                            Anesthesia Physical Anesthesia Plan  ASA: 3  Anesthesia Plan: General   Post-op Pain Management: Tylenol PO (pre-op)* and Minimal or no pain anticipated   Induction: Intravenous  PONV Risk Score and Plan: 2 and Treatment may vary due to age or medical condition, Ondansetron and Dexamethasone  Airway Management Planned: LMA  Additional Equipment: None  Intra-op Plan:   Post-operative Plan: Extubation in OR  Informed Consent: I have reviewed the patients History and Physical, chart, labs and discussed the procedure including the risks, benefits and alternatives for the proposed anesthesia with the patient or authorized representative who has indicated his/her understanding and acceptance.     Dental advisory given  Plan Discussed  with: CRNA and Anesthesiologist  Anesthesia Plan Comments:        Anesthesia Quick Evaluation

## 2021-12-09 ENCOUNTER — Encounter (HOSPITAL_COMMUNITY)
Admission: RE | Disposition: A | Discharge status: Home / Self Care | Payer: Self-pay | Source: Ambulatory Visit | Attending: General Surgery

## 2021-12-09 ENCOUNTER — Encounter (HOSPITAL_COMMUNITY): Payer: Self-pay | Admitting: General Surgery

## 2021-12-09 ENCOUNTER — Other Ambulatory Visit: Payer: Self-pay

## 2021-12-09 ENCOUNTER — Ambulatory Visit (HOSPITAL_COMMUNITY): Payer: Medicare PPO

## 2021-12-09 ENCOUNTER — Ambulatory Visit (HOSPITAL_COMMUNITY)
Admission: RE | Admit: 2021-12-09 | Discharge: 2021-12-09 | Disposition: A | Discharge status: Home / Self Care | Payer: Medicare PPO | Source: Ambulatory Visit | Attending: General Surgery | Admitting: General Surgery

## 2021-12-09 ENCOUNTER — Ambulatory Visit (HOSPITAL_COMMUNITY): Payer: Medicare PPO | Admitting: Physician Assistant

## 2021-12-09 ENCOUNTER — Ambulatory Visit (HOSPITAL_BASED_OUTPATIENT_CLINIC_OR_DEPARTMENT_OTHER): Payer: Medicare PPO | Admitting: Physician Assistant

## 2021-12-09 DIAGNOSIS — E669 Obesity, unspecified: Secondary | ICD-10-CM | POA: Diagnosis not present

## 2021-12-09 DIAGNOSIS — G4730 Sleep apnea, unspecified: Secondary | ICD-10-CM | POA: Diagnosis not present

## 2021-12-09 DIAGNOSIS — I10 Essential (primary) hypertension: Secondary | ICD-10-CM

## 2021-12-09 DIAGNOSIS — Z95828 Presence of other vascular implants and grafts: Secondary | ICD-10-CM

## 2021-12-09 DIAGNOSIS — C189 Malignant neoplasm of colon, unspecified: Secondary | ICD-10-CM

## 2021-12-09 DIAGNOSIS — C182 Malignant neoplasm of ascending colon: Secondary | ICD-10-CM | POA: Diagnosis not present

## 2021-12-09 DIAGNOSIS — C183 Malignant neoplasm of hepatic flexure: Secondary | ICD-10-CM | POA: Insufficient documentation

## 2021-12-09 DIAGNOSIS — I2510 Atherosclerotic heart disease of native coronary artery without angina pectoris: Secondary | ICD-10-CM | POA: Insufficient documentation

## 2021-12-09 DIAGNOSIS — Z6832 Body mass index (BMI) 32.0-32.9, adult: Secondary | ICD-10-CM | POA: Insufficient documentation

## 2021-12-09 DIAGNOSIS — K219 Gastro-esophageal reflux disease without esophagitis: Secondary | ICD-10-CM | POA: Diagnosis not present

## 2021-12-09 DIAGNOSIS — Z79899 Other long term (current) drug therapy: Secondary | ICD-10-CM | POA: Diagnosis not present

## 2021-12-09 DIAGNOSIS — F17200 Nicotine dependence, unspecified, uncomplicated: Secondary | ICD-10-CM | POA: Insufficient documentation

## 2021-12-09 HISTORY — PX: PORTACATH PLACEMENT: SHX2246

## 2021-12-09 SURGERY — INSERTION, TUNNELED CENTRAL VENOUS DEVICE, WITH PORT
Anesthesia: General

## 2021-12-09 MED ORDER — PROPOFOL 10 MG/ML IV BOLUS
INTRAVENOUS | Status: DC | PRN
  Administered 2021-12-09: 130 mg via INTRAVENOUS

## 2021-12-09 MED ORDER — EPHEDRINE SULFATE-NACL 50-0.9 MG/10ML-% IV SOSY
PREFILLED_SYRINGE | INTRAVENOUS | Status: DC | PRN
Start: 2021-12-09 — End: 2021-12-09
  Administered 2021-12-09 (×3): 5 mg via INTRAVENOUS
  Administered 2021-12-09: 10 mg via INTRAVENOUS

## 2021-12-09 MED ORDER — DEXAMETHASONE SODIUM PHOSPHATE 10 MG/ML IJ SOLN
INTRAMUSCULAR | Status: AC
  Filled 2021-12-09: qty 1

## 2021-12-09 MED ORDER — HEPARIN SOD (PORK) LOCK FLUSH 100 UNIT/ML IV SOLN
INTRAVENOUS | Status: DC | PRN
  Administered 2021-12-09: 500 [IU]

## 2021-12-09 MED ORDER — ORAL CARE MOUTH RINSE
15.0000 mL | Freq: Once | OROMUCOSAL | Status: AC
  Administered 2021-12-09: 15 mL via OROMUCOSAL

## 2021-12-09 MED ORDER — HEPARIN SOD (PORK) LOCK FLUSH 100 UNIT/ML IV SOLN
INTRAVENOUS | Status: AC
  Filled 2021-12-09: qty 5

## 2021-12-09 MED ORDER — DEXAMETHASONE SODIUM PHOSPHATE 10 MG/ML IJ SOLN
INTRAMUSCULAR | Status: DC | PRN
  Administered 2021-12-09: 8 mg via INTRAVENOUS

## 2021-12-09 MED ORDER — HEPARIN 6000 UNIT IRRIGATION SOLUTION: Status: DC | PRN

## 2021-12-09 MED ORDER — PROPOFOL 10 MG/ML IV BOLUS
INTRAVENOUS | Status: AC
  Filled 2021-12-09: qty 20

## 2021-12-09 MED ORDER — MIDAZOLAM HCL 5 MG/5ML IJ SOLN
INTRAMUSCULAR | Status: DC | PRN
  Administered 2021-12-09: 2 mg via INTRAVENOUS

## 2021-12-09 MED ORDER — FENTANYL CITRATE PF 50 MCG/ML IJ SOSY: 25.0000 ug | PREFILLED_SYRINGE | INTRAMUSCULAR | Status: DC | PRN

## 2021-12-09 MED ORDER — MIDAZOLAM HCL 2 MG/2ML IJ SOLN
INTRAMUSCULAR | Status: AC
  Filled 2021-12-09: qty 2

## 2021-12-09 MED ORDER — FENTANYL CITRATE (PF) 100 MCG/2ML IJ SOLN
INTRAMUSCULAR | Status: AC
  Filled 2021-12-09: qty 2

## 2021-12-09 MED ORDER — PHENYLEPHRINE HCL (PRESSORS) 10 MG/ML IV SOLN
INTRAVENOUS | Status: AC
  Filled 2021-12-09: qty 1

## 2021-12-09 MED ORDER — CEFAZOLIN SODIUM-DEXTROSE 2-4 GM/100ML-% IV SOLN
2.0000 g | INTRAVENOUS | Status: AC
  Administered 2021-12-09: 2 g via INTRAVENOUS
  Filled 2021-12-09: qty 100

## 2021-12-09 MED ORDER — ONDANSETRON HCL 4 MG/2ML IJ SOLN
INTRAMUSCULAR | Status: AC
  Filled 2021-12-09: qty 2

## 2021-12-09 MED ORDER — FENTANYL CITRATE (PF) 100 MCG/2ML IJ SOLN
INTRAMUSCULAR | Status: DC | PRN
  Administered 2021-12-09: 25 ug via INTRAVENOUS

## 2021-12-09 MED ORDER — ONDANSETRON HCL 4 MG/2ML IJ SOLN: 4.0000 mg | Freq: Once | INTRAMUSCULAR | Status: DC | PRN

## 2021-12-09 MED ORDER — LIDOCAINE HCL (PF) 2 % IJ SOLN
INTRAMUSCULAR | Status: AC
  Filled 2021-12-09: qty 5

## 2021-12-09 MED ORDER — SODIUM CHLORIDE 0.9% FLUSH: 3.0000 mL | Freq: Two times a day (BID) | INTRAVENOUS | Status: DC

## 2021-12-09 MED ORDER — LACTATED RINGERS IV SOLN: INTRAVENOUS | Status: DC

## 2021-12-09 MED ORDER — HEPARIN 6000 UNIT IRRIGATION SOLUTION
Freq: Once | Status: AC
Start: 2021-12-09 — End: 2021-12-09
  Administered 2021-12-09: 1
  Filled 2021-12-09: qty 500

## 2021-12-09 MED ORDER — OXYCODONE HCL 5 MG/5ML PO SOLN: 5.0000 mg | Freq: Once | ORAL | Status: DC | PRN

## 2021-12-09 MED ORDER — GLYCOPYRROLATE 0.2 MG/ML IJ SOLN
INTRAMUSCULAR | Status: DC | PRN
  Administered 2021-12-09: .2 mg via INTRAVENOUS

## 2021-12-09 MED ORDER — LIDOCAINE 2% (20 MG/ML) 5 ML SYRINGE
INTRAMUSCULAR | Status: DC | PRN
  Administered 2021-12-09: 60 mg via INTRAVENOUS

## 2021-12-09 MED ORDER — CHLORHEXIDINE GLUCONATE 0.12 % MT SOLN: 15.0000 mL | Freq: Once | OROMUCOSAL | Status: AC

## 2021-12-09 MED ORDER — BUPIVACAINE-EPINEPHRINE (PF) 0.25% -1:200000 IJ SOLN
INTRAMUSCULAR | Status: AC
  Filled 2021-12-09: qty 30

## 2021-12-09 MED ORDER — OXYCODONE HCL 5 MG PO TABS: 5.0000 mg | ORAL_TABLET | Freq: Once | ORAL | Status: DC | PRN

## 2021-12-09 MED ORDER — ACETAMINOPHEN 500 MG PO TABS
1000.0000 mg | ORAL_TABLET | ORAL | Status: AC
  Administered 2021-12-09: 1000 mg via ORAL
  Filled 2021-12-09: qty 2

## 2021-12-09 MED ORDER — EPHEDRINE 5 MG/ML INJ
INTRAVENOUS | Status: AC
  Filled 2021-12-09: qty 5

## 2021-12-09 MED ORDER — BUPIVACAINE-EPINEPHRINE 0.25% -1:200000 IJ SOLN
INTRAMUSCULAR | Status: DC | PRN
  Administered 2021-12-09: 14 mL

## 2021-12-09 MED ORDER — ONDANSETRON HCL 4 MG/2ML IJ SOLN
INTRAMUSCULAR | Status: DC | PRN
  Administered 2021-12-09: 4 mg via INTRAVENOUS

## 2021-12-09 SURGICAL SUPPLY — 34 items
ADH SKN CLS APL DERMABOND .7 (GAUZE/BANDAGES/DRESSINGS) ×1
APL PRP STRL LF DISP 70% ISPRP (MISCELLANEOUS) ×1
BAG COUNTER SPONGE SURGICOUNT (BAG) IMPLANT
BAG DECANTER FOR FLEXI CONT (MISCELLANEOUS) ×2 IMPLANT
BAG SPNG CNTER NS LX DISP (BAG)
BLADE SURG 15 STRL LF DISP TIS (BLADE) ×1 IMPLANT
BLADE SURG 15 STRL SS (BLADE) ×2
CHLORAPREP W/TINT 26 (MISCELLANEOUS) ×2 IMPLANT
DERMABOND ADVANCED (GAUZE/BANDAGES/DRESSINGS) ×1
DERMABOND ADVANCED .7 DNX12 (GAUZE/BANDAGES/DRESSINGS) ×1 IMPLANT
DRAPE C-ARM 42X120 X-RAY (DRAPES) ×2 IMPLANT
DRAPE LAPAROTOMY TRNSV 102X78 (DRAPES) ×2 IMPLANT
ELECT REM PT RETURN 15FT ADLT (MISCELLANEOUS) ×2 IMPLANT
GAUZE 4X4 16PLY ~~LOC~~+RFID DBL (SPONGE) ×2 IMPLANT
GLOVE SURG ENC MOIS LTX SZ6.5 (GLOVE) ×2 IMPLANT
GLOVE SURG UNDER LTX SZ6.5 (GLOVE) ×2 IMPLANT
GLOVE SURG UNDER POLY LF SZ7 (GLOVE) ×2 IMPLANT
GOWN STRL REUS W/TWL XL LVL3 (GOWN DISPOSABLE) ×4 IMPLANT
KIT BASIN OR (CUSTOM PROCEDURE TRAY) ×2 IMPLANT
KIT PORT POWER 8FR ISP CVUE (Port) ×1 IMPLANT
KIT TURNOVER KIT A (KITS) IMPLANT
NDL HYPO 25X1 1.5 SAFETY (NEEDLE) ×1 IMPLANT
NEEDLE HYPO 25X1 1.5 SAFETY (NEEDLE) ×2 IMPLANT
PACK BASIC VI WITH GOWN DISP (CUSTOM PROCEDURE TRAY) ×2 IMPLANT
PENCIL SMOKE EVACUATOR (MISCELLANEOUS) IMPLANT
SPIKE FLUID TRANSFER (MISCELLANEOUS) ×2 IMPLANT
SUT PROLENE 2 0 SH DA (SUTURE) ×2 IMPLANT
SUT VIC AB 3-0 SH 27 (SUTURE) ×2
SUT VIC AB 3-0 SH 27XBRD (SUTURE) ×1 IMPLANT
SUT VIC AB 4-0 PS2 18 (SUTURE) ×2 IMPLANT
SYR 10ML LL (SYRINGE) ×2 IMPLANT
SYR CONTROL 10ML LL (SYRINGE) ×2 IMPLANT
TOWEL OR 17X26 10 PK STRL BLUE (TOWEL DISPOSABLE) ×2 IMPLANT
TOWEL OR NON WOVEN STRL DISP B (DISPOSABLE) ×2 IMPLANT

## 2021-12-09 NOTE — H&P (Signed)
Chief Complaint: New Patient (New cancer malignant neoplasm of colon )       History of Present Illness: George Watkins is a 71 y.o. male who is seen today as an office consultation at the request of Dr. Ardis Hughs for evaluation of New Patient (New cancer malignant neoplasm of colon )  71 year old male who recently underwent a colonoscopy due to iron deficiency anemia and abdominal pain.  A malignant appearing mass was found in the hepatic flexure.  It was biopsied and tattooed distally.  Several other polyps were removed.  Biopsy showed adenocarcinoma.  CEA level is 25.9.  CT scan of the chest shows a mid a sending colon mass approximately 5 cm in length.  There are large mesocolic lymph nodes.  Mildly prominent axillary and iliac lymph nodes that are indeterminate at this time.  No other evidence of organ metastatic disease in the chest, abdomen or pelvis.  He underwent partial colectomy and did well with this     Review of Systems: A complete review of systems was obtained from the patient.  I have reviewed this information and discussed as appropriate with the patient.  See HPI as well for other ROS.     Medical History:     Past Medical History:  Diagnosis Date   Arthritis           Patient Active Problem List  Diagnosis   Colorectal cancer (CMS-HCC)   Hypertensive disorder   Pain in thoracic spine      History reviewed. No pertinent surgical history.    No Known Allergies         Current Outpatient Medications on File Prior to Visit  Medication Sig Dispense Refill   amLODIPine (NORVASC) 5 MG tablet Take 5 mg by mouth once daily       atorvastatin (LIPITOR) 10 MG tablet atorvastatin 10 mg tablet  TAKE 1 TABLET BY MOUTH EVERYDAY AT BEDTIME       cloNIDine HCL (CATAPRES) 0.2 MG tablet Take 0.2 mg by mouth 2 (two) times daily       labetaloL (TRANDATE) 200 MG tablet Take 200 mg by mouth 2 (two) times daily       losartan-hydrochlorothiazide (HYZAAR) 100-25 mg tablet Take 1  tablet by mouth once daily       multivitamin with minerals tablet Take by mouth        No current facility-administered medications on file prior to visit.           Family History  Problem Relation Age of Onset   High blood pressure (Hypertension) Mother        Social History       Tobacco Use  Smoking Status Never  Smokeless Tobacco Never      Social History         Socioeconomic History   Marital status: Married  Tobacco Use   Smoking status: Never   Smokeless tobacco: Never  Substance and Sexual Activity   Alcohol use: Yes      Comment: social   Drug use: Never      Objective:       Vitals:   12/09/21 0631  BP: 102/76  Pulse: 66  Resp: 18  Temp: 98.2 F (36.8 C)  SpO2: 98%       Exam Gen: NAD CV: RRR Lungs: CTA Abd: soft       Labs, Imaging and Diagnostic Testing: CT C/A/P IMPRESSION: 1. There is a circumferential mass of the mid ascending  colon measuring approximately 5 cm in length, in keeping with newly diagnosed primary colon malignancy. 2. Adjacent to the colon mass, there is mesenteric fat stranding suggesting invasion beyond the muscularis as well as abnormally enlarged mesocolonic lymph nodes. 3. Mildly prominent, asymmetric right external iliac lymph nodes, nonspecific although suspicious for early nodal metastatic disease. 4. Asymmetrically prominent left axillary lymph nodes, nonspecific, these nodes less favored to reflect metastatic disease given location in the absence of other evidence of metastatic disease above the diaphragm. Both external iliac and axillary lymph nodes could be characterized for abnormal metabolic activity by PET-CT. 5. No evidence of organ metastatic disease in the chest, abdomen, or pelvis. 6. Exophytic soft tissue attenuation lesion of the anterior midportion of the left kidney measuring 1.2 cm. Partially exophytic soft tissue attenuation lesion of the posteroinferior pole of the left kidney  measuring 1.9 x 1.7 cm. These may reflect hemorrhagic or proteinaceous cysts but are incompletely characterized and incidental renal cell carcinoma is not excluded. Recommend initial renal ultrasound to establish definitively cystic character, multiphasic contrast enhanced CT or MRI may however be further required to exclude malignancy. 7. Prostatomegaly with thickening of the decompressed urinary bladder wall, likely due to chronic outlet obstruction.   CT images personally reviewed.  Mass in the distal ascending colon   Assessment and Plan:  Diagnoses and all orders for this visit:   Colon cancer, ascending (CMS-HCC)  71 year old male who presents to the office for evaluation of a newly diagnosed ascending colon mass.  Surgical resection completed.  Pt with stage 3 disease.  IV chemotherapy recommended.  Will place port today.  Risks discussed with pt, including bleeding, pneumothorax, infection, port malfunction and need for additional procedures.

## 2021-12-09 NOTE — Anesthesia Postprocedure Evaluation (Signed)
Anesthesia Post Note  Patient: George Watkins  Procedure(s) Performed: INSERTION PORT-A-CATH with ultrasound     Patient location during evaluation: PACU Anesthesia Type: General Level of consciousness: awake and alert Pain management: pain level controlled Vital Signs Assessment: post-procedure vital signs reviewed and stable Respiratory status: spontaneous breathing, nonlabored ventilation and respiratory function stable Cardiovascular status: stable, blood pressure returned to baseline and bradycardic Anesthetic complications: no   No notable events documented.  Last Vitals:  Vitals:   12/09/21 0918 12/09/21 0919  BP:    Pulse: (!) 52 (!) 53  Resp:    Temp:    SpO2: 99% 100%    Last Pain:  Vitals:   12/09/21 0916  TempSrc:   PainSc: 0-No pain                 Audry Pili

## 2021-12-09 NOTE — Op Note (Signed)
12/09/2021  8:26 AM  PATIENT:  George Watkins  71 y.o. male  Patient Care Team: Arthur Holms, NP as PCP - General (Nurse Practitioner) Truitt Merle, MD as Consulting Physician (Oncology)  PRE-OPERATIVE DIAGNOSIS:  stage 3 colon cancer  POST-OPERATIVE DIAGNOSIS:  stage 3 colon cancer  PROCEDURE:  INSERTION PORT-A-CATH with ultrasound  Surgeon(s): Leighton Ruff, MD  ANESTHESIA:   local and general  EBL: 3ml Total I/O In: 100 [IV Piggyback:100] Out: -   DISPOSITION OF SPECIMEN:  N/A  COUNTS:  YES  PLAN OF CARE: Discharge to home after PACU  PATIENT DISPOSITION:  PACU - hemodynamically stable.  INDICATION: Patient with need for IV chemotherapy. Port-A-Cath placement was requested.   Use of a central venous catheter for intravenous therapy was discussed. Technique of catheter placement using ultrasound and fluoroscopy guidance was discussed. Risks such as bleeding, infection, pneumothorax, catheter occlusion, reoperation, and other risks were discussed. I noted a good likelihood this will help address the problem. Questions were answered. The patient expressed understanding & wishes to proceed.   Findings: Normal-appearing anatomy.   8 [place] power port. It goes through the right internal jugular vein   Procedure: Informed consent was confirmed. Patient was brought the operating room and positioned supine. Arms were tucked. The patient underwent deep sedation. Neck and chest were clipped and prepped and draped in a sterile fashion. A surgical timeout confirmed our plan. I placed a field block of local anesthesia on the chest.  I entered into the right internal jugular vein on the first venipuncture using US guidance. Non-pulsatile blood was returned. Wire was easily passed into the inferior vena cava and confirmed by fluoroscopy. I confirmed placement of the wire in the right side of the chest.  I made an incision in the lateral infraclavicular area and made a subcutaneous pocket. I  used a dilator on the wire using Seldinger technique to dilate the tract under fluoroscopy. I placed the catheter into the sheath. I then peeled away the dilator sheath. I tunneled the power port from the puncture site to the chest pocket. I cut the catheter to appropriate length and attached it to the port using the plastic connector. The port was placed into the pocket and secured to the left anterior chest wall using 2-0 Prolene interrupted stitches x2. Catheter flushed well.  Fluoroscopy confirmed the tip in the distal SVC. Catheter aspirated and flushed well. On final fluoro reevaluation the tip seen to be in good position in the distal SVC.  I closed the wounds using 3-0 Vicryl interrupted sutures for the pocket and 4-0 Monocryl stitch was used to close the skin. Dermabond was used on the 2 incisions. CXR will be performed in PACU. Patient should go home later today. Catheter is okay to use.

## 2021-12-09 NOTE — Anesthesia Procedure Notes (Signed)
Procedure Name: LMA Insertion Date/Time: 12/09/2021 7:34 AM Performed by: Victoriano Lain, CRNA Pre-anesthesia Checklist: Patient identified, Emergency Drugs available, Suction available, Patient being monitored and Timeout performed Patient Re-evaluated:Patient Re-evaluated prior to induction Oxygen Delivery Method: Circle system utilized Preoxygenation: Pre-oxygenation with 100% oxygen Induction Type: IV induction LMA: LMA with gastric port inserted LMA Size: 4.0 Number of attempts: 1 Placement Confirmation: positive ETCO2 and breath sounds checked- equal and bilateral Tube secured with: Tape Dental Injury: Teeth and Oropharynx as per pre-operative assessment

## 2021-12-09 NOTE — Discharge Instructions (Addendum)

## 2021-12-09 NOTE — Transfer of Care (Signed)
Immediate Anesthesia Transfer of Care Note  Patient: George Watkins  Procedure(s) Performed: INSERTION PORT-A-CATH with ultrasound  Patient Location: PACU  Anesthesia Type:General  Level of Consciousness: awake, alert , oriented and patient cooperative  Airway & Oxygen Therapy: Patient Spontanous Breathing and Patient connected to face mask oxygen  Post-op Assessment: Report given to RN, Post -op Vital signs reviewed and stable and Patient moving all extremities  Post vital signs: Reviewed and stable  Last Vitals:  Vitals Value Taken Time  BP 134/87 12/09/21 0835  Temp    Pulse 50 12/09/21 0837  Resp 11 12/09/21 0837  SpO2 100 % 12/09/21 0837  Vitals shown include unvalidated device data.  Last Pain:  Vitals:   12/09/21 0631  TempSrc: Oral         Complications: No notable events documented.

## 2021-12-10 ENCOUNTER — Encounter (HOSPITAL_COMMUNITY): Payer: Self-pay | Admitting: General Surgery

## 2021-12-10 ENCOUNTER — Telehealth: Payer: Self-pay | Admitting: Hematology

## 2021-12-10 NOTE — Telephone Encounter (Signed)
Called patient regarding upcoming appointments, patient is notified. 

## 2021-12-13 ENCOUNTER — Other Ambulatory Visit: Payer: Medicare PPO

## 2021-12-16 NOTE — Progress Notes (Signed)
Pharmacist Chemotherapy Monitoring - Initial Assessment   ? ?Anticipated start date: 12/23/21  ? ?The following has been reviewed per standard work regarding the patient's treatment regimen: ?The patient's diagnosis, treatment plan and drug doses, and organ/hematologic function ?Lab orders and baseline tests specific to treatment regimen  ?The treatment plan start date, drug sequencing, and pre-medications ?Prior authorization status  ?Patient's documented medication list, including drug-drug interaction screen and prescriptions for anti-emetics and supportive care specific to the treatment regimen ?The drug concentrations, fluid compatibility, administration routes, and timing of the medications to be used ?The patient's access for treatment and lifetime cumulative dose history, if applicable  ?The patient's medication allergies and previous infusion related reactions, if applicable  ? ?Changes made to treatment plan:  ?N/A ? ?Follow up needed:  ?Pending authorization for treatment  ? ? ?Raul Del Piffard, [place], BCPS, BCOP ?12/16/2021  4:13 PM  ?

## 2021-12-20 ENCOUNTER — Inpatient Hospital Stay: Payer: Medicare PPO

## 2021-12-20 ENCOUNTER — Other Ambulatory Visit: Payer: Self-pay

## 2021-12-22 MED FILL — Dexamethasone Sodium Phosphate Inj 100 MG/10ML: INTRAMUSCULAR | Qty: 1 | Status: AC

## 2021-12-23 ENCOUNTER — Inpatient Hospital Stay: Payer: Medicare PPO | Attending: Physician Assistant

## 2021-12-23 ENCOUNTER — Other Ambulatory Visit: Payer: Self-pay

## 2021-12-23 ENCOUNTER — Inpatient Hospital Stay (HOSPITAL_BASED_OUTPATIENT_CLINIC_OR_DEPARTMENT_OTHER): Payer: Medicare PPO | Admitting: Hematology

## 2021-12-23 ENCOUNTER — Inpatient Hospital Stay: Payer: Medicare PPO

## 2021-12-23 ENCOUNTER — Encounter: Payer: Self-pay | Admitting: Hematology

## 2021-12-23 VITALS — BP 140/81 | HR 56 | Temp 97.9°F | Resp 16 | Ht 69.5 in | Wt 230.2 lb

## 2021-12-23 DIAGNOSIS — C779 Secondary and unspecified malignant neoplasm of lymph node, unspecified: Secondary | ICD-10-CM | POA: Insufficient documentation

## 2021-12-23 DIAGNOSIS — C19 Malignant neoplasm of rectosigmoid junction: Secondary | ICD-10-CM

## 2021-12-23 DIAGNOSIS — C182 Malignant neoplasm of ascending colon: Secondary | ICD-10-CM

## 2021-12-23 DIAGNOSIS — I10 Essential (primary) hypertension: Secondary | ICD-10-CM | POA: Insufficient documentation

## 2021-12-23 DIAGNOSIS — D509 Iron deficiency anemia, unspecified: Secondary | ICD-10-CM | POA: Insufficient documentation

## 2021-12-23 DIAGNOSIS — Z5111 Encounter for antineoplastic chemotherapy: Secondary | ICD-10-CM | POA: Insufficient documentation

## 2021-12-23 DIAGNOSIS — R590 Localized enlarged lymph nodes: Secondary | ICD-10-CM | POA: Insufficient documentation

## 2021-12-23 DIAGNOSIS — N289 Disorder of kidney and ureter, unspecified: Secondary | ICD-10-CM | POA: Insufficient documentation

## 2021-12-23 DIAGNOSIS — R21 Rash and other nonspecific skin eruption: Secondary | ICD-10-CM | POA: Diagnosis not present

## 2021-12-23 DIAGNOSIS — Z95828 Presence of other vascular implants and grafts: Secondary | ICD-10-CM

## 2021-12-23 LAB — CMP (CANCER CENTER ONLY)
ALT: 25 U/L (ref 0–44)
AST: 19 U/L (ref 15–41)
Albumin: 3.6 g/dL (ref 3.5–5.0)
Alkaline Phosphatase: 84 U/L (ref 38–126)
Anion gap: 5 (ref 5–15)
BUN: 10 mg/dL (ref 8–23)
CO2: 31 mmol/L (ref 22–32)
Calcium: 9 mg/dL (ref 8.9–10.3)
Chloride: 103 mmol/L (ref 98–111)
Creatinine: 1.19 mg/dL (ref 0.61–1.24)
GFR, Estimated: >60 mL/min (ref >60)
Glucose, Bld: 169 mg/dL — ABNORMAL HIGH (ref 70–99)
Potassium: 3.3 mmol/L — ABNORMAL LOW (ref 3.5–5.1)
Sodium: 139 mmol/L (ref 135–145)
Total Bilirubin: 0.5 mg/dL (ref 0.3–1.2)
Total Protein: 7 g/dL (ref 6.5–8.1)

## 2021-12-23 LAB — CBC WITH DIFFERENTIAL (CANCER CENTER ONLY)
Abs Immature Granulocytes: 0.01 10*3/uL (ref 0.00–0.07)
Basophils Absolute: 0 10*3/uL (ref 0.0–0.1)
Basophils Relative: 0 %
Eosinophils Absolute: 0.2 10*3/uL (ref 0.0–0.5)
Eosinophils Relative: 3 %
HCT: 36 % — ABNORMAL LOW (ref 39.0–52.0)
Hemoglobin: 10.9 g/dL — ABNORMAL LOW (ref 13.0–17.0)
Immature Granulocytes: 0 %
Lymphocytes Relative: 19 %
Lymphs Abs: 1.4 10*3/uL (ref 0.7–4.0)
MCH: 26.4 pg (ref 26.0–34.0)
MCHC: 30.3 g/dL (ref 30.0–36.0)
MCV: 87.2 fL (ref 80.0–100.0)
Monocytes Absolute: 0.7 10*3/uL (ref 0.1–1.0)
Monocytes Relative: 10 %
Neutro Abs: 5.2 10*3/uL (ref 1.7–7.7)
Neutrophils Relative %: 68 %
Platelet Count: 180 10*3/uL (ref 150–400)
RBC: 4.13 MIL/uL — ABNORMAL LOW (ref 4.22–5.81)
RDW: 13.9 % (ref 11.5–15.5)
WBC Count: 7.6 10*3/uL (ref 4.0–10.5)
nRBC: 0 % (ref 0.0–0.2)

## 2021-12-23 LAB — SAMPLE TO BLOOD BANK

## 2021-12-23 MED ORDER — PALONOSETRON HCL INJECTION 0.25 MG/5ML
0.2500 mg | Freq: Once | INTRAVENOUS | Status: AC
  Administered 2021-12-23: 0.25 mg via INTRAVENOUS
  Filled 2021-12-23: qty 5

## 2021-12-23 MED ORDER — DEXTROSE 5 % IV SOLN: Freq: Once | INTRAVENOUS | Status: AC

## 2021-12-23 MED ORDER — HEPARIN SOD (PORK) LOCK FLUSH 100 UNIT/ML IV SOLN: 500.0000 [IU] | Freq: Once | INTRAVENOUS | Status: DC | PRN

## 2021-12-23 MED ORDER — SODIUM CHLORIDE 0.9% FLUSH: 10.0000 mL | INTRAVENOUS | Status: DC | PRN

## 2021-12-23 MED ORDER — SODIUM CHLORIDE 0.9% FLUSH
10.0000 mL | Freq: Once | INTRAVENOUS | Status: AC
  Administered 2021-12-23: 10 mL

## 2021-12-23 MED ORDER — OXALIPLATIN CHEMO INJECTION 100 MG/20ML
85.0000 mg/m2 | Freq: Once | INTRAVENOUS | Status: AC
  Administered 2021-12-23: 190 mg via INTRAVENOUS
  Filled 2021-12-23: qty 38

## 2021-12-23 MED ORDER — SODIUM CHLORIDE 0.9 % IV SOLN
2400.0000 mg/m2 | INTRAVENOUS | Status: DC
  Administered 2021-12-23: 5350 mg via INTRAVENOUS
  Filled 2021-12-23: qty 107

## 2021-12-23 MED ORDER — LEUCOVORIN CALCIUM INJECTION 350 MG
400.0000 mg/m2 | Freq: Once | INTRAVENOUS | Status: AC
  Administered 2021-12-23: 892 mg via INTRAVENOUS
  Filled 2021-12-23: qty 44.6

## 2021-12-23 MED ORDER — FLUOROURACIL CHEMO INJECTION 2.5 GM/50ML
400.0000 mg/m2 | Freq: Once | INTRAVENOUS | Status: AC
  Administered 2021-12-23: 900 mg via INTRAVENOUS
  Filled 2021-12-23: qty 18

## 2021-12-23 MED ORDER — SODIUM CHLORIDE 0.9 % IV SOLN
10.0000 mg | Freq: Once | INTRAVENOUS | Status: AC
  Administered 2021-12-23: 10 mg via INTRAVENOUS
  Filled 2021-12-23: qty 10

## 2021-12-23 MED ORDER — POTASSIUM CHLORIDE CRYS ER 10 MEQ PO TBCR: 10.0000 meq | EXTENDED_RELEASE_TABLET | Freq: Two times a day (BID) | ORAL | 0 refills | Status: DC

## 2021-12-23 NOTE — Progress Notes (Addendum)
George Watkins   [phone] [phone]   Clinic Follow up Note   Patient Care Team: Arthur Holms, NP as PCP - General (Nurse Practitioner) Truitt Merle, MD as Consulting Physician (Oncology)  Date of Service:  12/23/2021  CHIEF COMPLAINT: f/u of colorectal cancer  CURRENT THERAPY:  Adjuvant FOLFOX, to start 12/23/21  ASSESSMENT & PLAN:  George Watkins is a 71 y.o. male with   1. Colorectal cancer, adenocarcinoma of the ascending colon, pT3N2aMx, at least stage IIIB, MSS -Colonoscopy performed on 10/13/21 for IDA and abdominal pain showed a 4 cm mass near the hepatic flexure. Pathology confirmed adenocarcinoma, MMR normal. -Baseline CEA was elevated at 25.9 on 10/13/21 -CT C/A/P on 10/21/21 showed: circumferential mass in the mid ascending colon; mildly prominent, asymmetric right external iliac lymph node, nonspecific but suspicious.   -PET scan on 11/09/21 showed: hypermetabolism to colon mass and adjacent mildly enlarged ileocolonic mesenteric lymph nodes. Otherwise, no definitive evidence of additional spread. -partial colectomy on 11/17/21 with Dr. Marcello Moores showed 5.6 cm invasive moderately differentiated adenocarcinoma invading into pericolic adipose. Margins negative, but metastatic carcinoma in 6 lymph nodes (6/15). Two additional tubular adenomas without high-grade dysplasia noted. MMR normal and MSI stable.  -given his multiple positive lymph nodes, I recommend adjuvant chemotherapy with FOLFOX for 6 months.He is scheduled to begin treatment today, 12/23/21. -labs reviewed, adequate to proceed with first cycle FOLFOX today as scheduled. We reviewed potential side effects and management, especially cold sensitivity, neuropathy, and risk of neutropenic fever.   -Follow-up in 2 weeks before cycle 2   2.  Iron deficiency anemia  -Patient currently taking ferrous sulfate BID and compliant.  Had good response to oral iron supplement.  Has not required iron  infusion or blood transfusion.  -hgb overall stable since surgery, 10.9 today (12/23/21)   3. Renal Lesion -incidental finding on CT CAP 10/21/21 -PET on 11/09/21 showed no metabolic activity, favored to reflect hemorrhagic/proteinaceous cysts. Plan to get renal US after his surgery    4.  Left axillary adenopathy -Seen on initial CT scan, positive on PET scan in January 2023. -Attempted biopsy, ultrasound showed normal-appearing left axillary adenopathy, biopsy was deferred. -will f/u by CT scan in May or June   5. Rash around port -Likely allergy reaction to adhesive -use topical hydrocortisone    PLAN: -proceed with first FOLFOX today, NO GCSF  -lab, flush, and f/u in 2 weeks as scheduled   No problem-specific Assessment & Plan notes found for this encounter.   SUMMARY OF ONCOLOGIC HISTORY: Oncology History Overview Note   Cancer Staging  Colorectal cancer [facility]) Staging form: Colon and Rectum, AJCC 8th Edition - Pathologic stage from 11/17/2021: Stage IIIB (pT3, pN2a, cM0) - Signed by Truitt Merle, MD on 12/03/2021    Cancer of right colon [facility])  10/13/2021 Procedure   Upper GI endoscopy by Dr. Ardis Hughs for IDA and abdominal pain  Findings:  - The esophagus was normal. - The stomach was normal. - The examined duodenum was normal.   10/13/2021 Procedure   Colonoscopy Indications: Iron deficiency  - A fungating and ulcerated partially obstructing, clearly malignant mass was found at the hepatic flexure. The mass was circumferential and measured 4cm in length. Biopsies were taken with a cold forceps for histology and then the mucosa just distal to the mass was tattooed with an injection of Spot (carbon black). jar 1 Findings: - Two pedunculated polyps were found in the mid transverse colon. The polyps were 8 to  11 mm in size. These polyps were removed with a hot snare. Resection and retrieval were complete. jar 2 - Five pedunculated and sessile polyps were found in the  distal transverse colon. The polyps were 4 to 14 mm in size. Two were removed with hot snare, three were removed with cold snare. Resection and retrieval were complete. jar 3 - A 20 mm polyp was found in the distal transverse colon. The polyp was pedunculated. The polyp was removed with a hot snare. Resection and retrieval were complete. jar 4 - The exam was otherwise without abnormality on direct and retroflexion views.   10/13/2021 Pathology Results   REPORT OF SURGICAL PATHOLOGY Accession: [id number]  Diagnosis 1. Hepatic Flexure Biopsy - ADENOCARCINOMA, SEE NOTE   10/21/2021 Imaging   CT CHEST ABDOMEN PELVIS W CONTRAST  IMPRESSION: 1. There is a circumferential mass of the mid ascending colon measuring approximately 5 cm in length, in keeping with newly diagnosed primary colon malignancy. 2. Adjacent to the colon mass, there is mesenteric fat stranding suggesting invasion beyond the muscularis as well as abnormally enlarged mesocolonic lymph nodes. 3. Mildly prominent, asymmetric right external iliac lymph nodes, nonspecific although suspicious for early nodal metastatic disease. 4. Asymmetrically prominent left axillary lymph nodes, nonspecific, these nodes less favored to reflect metastatic disease given location in the absence of other evidence of metastatic disease above the diaphragm. Both external iliac and axillary lymph nodes could be characterized for abnormal metabolic activity by PET-CT. 5. No evidence of organ metastatic disease in the chest, abdomen, or pelvis. 6. Exophytic soft tissue attenuation lesion of the anterior midportion of the left kidney measuring 1.2 cm. Partially exophytic soft tissue attenuation lesion of the posteroinferior pole of the left kidney measuring 1.9 x 1.7 cm. These may reflect hemorrhagic or proteinaceous cysts but are incompletely characterized and incidental renal cell carcinoma is not excluded. Recommend initial renal ultrasound  to establish definitively cystic character, multiphasic contrast enhanced CT or MRI may however be further required to exclude malignancy. 7. Prostatomegaly with thickening of the decompressed urinary bladder wall, likely due to chronic outlet obstruction.   Aortic Atherosclerosis (ICD10-I70.0).   10/28/2021 Initial Diagnosis   Colorectal cancer ([place])   11/17/2021 Cancer Staging   Staging form: Colon and Rectum, AJCC 8th Edition - Pathologic stage from 11/17/2021: Stage IIIB (pT3, pN2a, cM0) - Signed by Truitt Merle, MD on 12/03/2021 Histologic grading system: 4 grade system Histologic grade (G): G2 Residual tumor (R): R0 - None    11/17/2021 Definitive Surgery   FINAL MICROSCOPIC DIAGNOSIS:   A. COLON, RIGHT, RESECTION:  Invasive moderately differentiated adenocarcinoma invading into  pericolic adipose (pT3)  Tumor measures 5.2 x 3.6 x 0.8 cm  Margins free of carcinoma and adenomatous change  Six of fifteen pericolic lymph nodes with metastatic carcinoma (6/15, pN2a)  Two additional tubular adenomas without high-grade dysplasia, largest measuring 1.3 cm  Benign appendix   ADDENDUM:  Mismatch Repair Protein (IHC)  SUMMARY INTERPRETATION: NORMAL  Microsatellite Instability (MSI) Result: STABLE   12/23/2021 -  Chemotherapy   Patient is on Treatment Plan : COLORECTAL FOLFOX q14d x 6 months        INTERVAL HISTORY:  George Watkins is here for a follow up of colorectal cancer. He was last seen by me on 12/03/21. He was seen in the infusion area. He reports that he works about two hours per day following surgery. He denies having diarrhea. Prior to surgery, he says he was unable to eat bananas as  they would make him feel ill.    All other systems were reviewed with the patient and are negative.  MEDICAL HISTORY:  Past Medical History:  Diagnosis Date   Anemia    Arthritis    Colon cancer ([place])    Coronary artery disease    GERD (gastroesophageal reflux disease)    Heart murmur     Hiatal hernia    Hypertension    Pneumonia    Sleep apnea     SURGICAL HISTORY: Past Surgical History:  Procedure Laterality Date   ANKLE SURGERY     CIRCUMCISION     COLONOSCOPY     HERNIA REPAIR     PORTACATH PLACEMENT N/A 12/09/2021   Procedure: INSERTION PORT-A-CATH with ultrasound;  Surgeon: Leighton Ruff, MD;  Location: [facility];  Service: General;  Laterality: N/A;    I have reviewed the social history and family history with the patient and they are unchanged from previous note.  ALLERGIES:  is allergic to sulfa antibiotics.  MEDICATIONS:  Current Outpatient Medications  Medication Sig Dispense Refill   potassium chloride (KLOR-CON M) 10 MEQ tablet Take 1 tablet (10 mEq total) by mouth 2 (two) times daily. 10 tablet 0   amLODipine (NORVASC) 5 MG tablet Take 5 mg by mouth daily.     atorvastatin (LIPITOR) 10 MG tablet Take 10 mg by mouth at bedtime.     cholecalciferol (VITAMIN D3) 25 MCG (1000 UNIT) tablet Take 1,000 Units by mouth daily.     cloNIDine (CATAPRES) 0.2 MG tablet Take 0.2 mg by mouth 2 (two) times daily.     cyclobenzaprine (FLEXERIL) 5 MG tablet Take 5 mg by mouth at bedtime as needed for muscle spasms.     ferrous sulfate 325 (65 FE) MG tablet Take 1 tablet (325 mg total) by mouth 2 (two) times daily with a meal. 30 tablet 0   labetalol (NORMODYNE) 200 MG tablet Take 200 mg by mouth 2 (two) times daily.     lidocaine-prilocaine (EMLA) cream Apply to affected area once 30 g 3   losartan-hydrochlorothiazide (HYZAAR) 100-25 MG tablet Take 1 tablet by mouth daily.     Multiple Vitamins-Minerals (MULTIVITAMIN MEN 50+ PO) Take 1 tablet by mouth daily.     ondansetron (ZOFRAN) 8 MG tablet Take 1 tablet (8 mg total) by mouth 2 (two) times daily as needed for refractory nausea / vomiting. Start on day 3 after chemotherapy. 30 tablet 1   pantoprazole (PROTONIX) 40 MG tablet Take 40 mg by mouth daily.     prochlorperazine (COMPAZINE) 10 MG tablet Take 1 tablet (10  mg total) by mouth every 6 (six) hours as needed (Nausea or vomiting). 30 tablet 1   traMADol (ULTRAM) 50 MG tablet Take 1-2 tablets (50-100 mg total) by mouth every 6 (six) hours as needed for moderate pain. 30 tablet 0   No current facility-administered medications for this visit.   Facility-Administered Medications Ordered in Other Visits  Medication Dose Route Frequency Provider Last Rate Last Admin   fluorouracil (ADRUCIL) 5,350 mg in sodium chloride 0.9 % 143 mL chemo infusion  2,400 mg/m2 (Treatment Plan Recorded) Intravenous 1 day or 1 dose Truitt Merle, MD   5,350 mg at 12/23/21 1505   heparin lock flush 100 unit/mL  500 Units Intracatheter Once PRN Truitt Merle, MD       sodium chloride flush (NS) 0.9 % injection 10 mL  10 mL Intracatheter PRN Truitt Merle, MD  PHYSICAL EXAMINATION: ECOG PERFORMANCE STATUS: 1 - Symptomatic but completely ambulatory  There were no vitals filed for this visit. Wt Readings from Last 3 Encounters:  12/23/21 230 lb 4 oz (104.4 kg)  12/03/21 223 lb 2 oz (101.2 kg)  11/18/21 217 lb 6 oz (98.6 kg)     GENERAL:alert, no distress and comfortable SKIN: skin color, texture, turgor are normal, no rashes or significant lesions, (+) see photo of port site below EYES: normal, Conjunctiva are pink and non-injected, sclera clear  NEURO: alert & oriented x 3 with fluent speech, no focal motor/sensory deficits    LABORATORY DATA:  I have reviewed the data as listed CBC Latest Ref Rng & Units 12/23/2021 12/07/2021 12/03/2021  WBC 4.0 - 10.5 K/uL 7.6 6.9 8.4  Hemoglobin 13.0 - 17.0 g/dL 10.9(L) 11.2(L) 10.8(L)  Hematocrit 39.0 - 52.0 % 36.0(L) 36.2(L) 35.2(L)  Platelets 150 - 400 K/uL 180 255 263     CMP Latest Ref Rng & Units 12/23/2021 12/03/2021 11/19/2021  Glucose 70 - 99 mg/dL 169(H) 205(H) 113(H)  BUN 8 - 23 mg/dL _0 Creatinine 0.61 - 1.24 mg/dL 1.19 1.28(H) 1.07  Sodium 135 - 145 mmol/L 139 141 137  Potassium 3.5 - 5.1 mmol/L 3.3(L) 3.6 3.9   Chloride 98 - 111 mmol/L 103 105 102  CO2 22 - 32 mmol/L _1 Calcium 8.9 - 10.3 mg/dL 9.0 9.0 8.3(L)  Total Protein 6.5 - 8.1 g/dL 7.0 7.2 -  Total Bilirubin 0.3 - 1.2 mg/dL 0.5 0.7 -  Alkaline Phos 38 - 126 U/L 84 71 -  AST 15 - 41 U/L 19 22 -  ALT 0 - 44 U/L 25 27 -      RADIOGRAPHIC STUDIES: I have personally reviewed the radiological images as listed and agreed with the findings in the report. No results found.    No orders of the defined types were placed in this encounter.  All questions were answered. The patient knows to call the clinic with any problems, questions or concerns. No barriers to learning was detected. The total time spent in the appointment was 30 minutes.     Truitt Merle, MD 12/23/2021   I, Melene Muller, am acting as scribe for Truitt Merle, MD.   I have reviewed the above documentation for accuracy and completeness, and I agree with the above.

## 2021-12-23 NOTE — Patient Instructions (Signed)
[facility]  Discharge Instructions: Thank you for choosing [facility] to provide your oncology and hematology care.   If you have a lab appointment with the [facility], please go directly to the [facility] and check in at the registration area.   Wear comfortable clothing and clothing appropriate for easy access to any Portacath or PICC line.   We strive to give you quality time with your provider. You may need to reschedule your appointment if you arrive late (15 or more minutes).  Arriving late affects you and other patients whose appointments are after yours.  Also, if you miss three or more appointments without notifying the office, you may be dismissed from the clinic at the providers discretion.      For prescription refill requests, have your pharmacy contact our office and allow 72 hours for refills to be completed.    Today you received the following chemotherapy and/or immunotherapy agents: oxaliplatin, leucovorin, fluorourcil      To help prevent nausea and vomiting after your treatment, we encourage you to take your nausea medication as directed.  BELOW ARE SYMPTOMS THAT SHOULD BE REPORTED IMMEDIATELY: *FEVER GREATER THAN 100.4 F (38 C) OR HIGHER *CHILLS OR SWEATING *NAUSEA AND VOMITING THAT IS NOT CONTROLLED WITH YOUR NAUSEA MEDICATION *UNUSUAL SHORTNESS OF BREATH *UNUSUAL BRUISING OR BLEEDING *URINARY PROBLEMS (pain or burning when urinating, or frequent urination) *BOWEL PROBLEMS (unusual diarrhea, constipation, pain near the anus) TENDERNESS IN MOUTH AND THROAT WITH OR WITHOUT PRESENCE OF ULCERS (sore throat, sores in mouth, or a toothache) UNUSUAL RASH, SWELLING OR PAIN  UNUSUAL VAGINAL DISCHARGE OR ITCHING   Items with * indicate a potential emergency and should be followed up as soon as possible or go to the Emergency Department if any problems should occur.  Please show the CHEMOTHERAPY ALERT CARD or IMMUNOTHERAPY  ALERT CARD at check-in to the Emergency Department and triage nurse.  Should you have questions after your visit or need to cancel or reschedule your appointment, please contact [facility]  Dept: [phone]  and follow the prompts.  Office hours are 8:00 a.m. to 4:30 p.m. Monday - Friday. Please note that voicemails left after 4:00 p.m. may not be returned until the following business day.  We are closed weekends and major holidays. You have access to a nurse at all times for urgent questions. Please call the main number to the clinic Dept: [phone] and follow the prompts.   For any non-urgent questions, you may also contact your provider using MyChart. We now offer e-Visits for anyone 67 and older to request care online for non-urgent symptoms. For details visit [email or web address].   Also download the MyChart app! Go to the app store, search "MyChart", open the app, select [facility], and log in with your MyChart username and password.  Due to Covid, a mask is required upon entering the hospital/clinic. If you do not have a mask, one will be given to you upon arrival. For doctor visits, patients may have 1 support person aged 75 or older with them. For treatment visits, patients cannot have anyone with them due to current Covid guidelines and our immunocompromised population.   The chemotherapy medication bag should finish at 46 hours, 96 hours, or 7 days. For example, if your pump is scheduled for 46 hours and it was put on at 4:00 p.m., it should finish at 2:00 p.m. the day it is scheduled to come off regardless  of your appointment time.     Estimated time to finish at .   If the display on your pump reads "Low Volume" and it is beeping, take the batteries out of the pump and come to the cancer center for it to be taken off.   If the pump alarms go off prior to the pump reading "Low Volume" then call [phone] and someone can assist you.  If the  plunger comes out and the chemotherapy medication is leaking out, please use your home chemo spill kit to clean up the spill. Do NOT use paper towels or other household products.  If you have problems or questions regarding your pump, please call either [phone] (24 hours a day) or the cancer center Monday-Friday 8:00 a.m.- 4:30 p.m. at the clinic number and we will assist you. If you are unable to get assistance, then go to the nearest Emergency Department and ask the staff to contact the IV team for assistance.    Oxaliplatin Injection What is this medication? OXALIPLATIN (ox AL i PLA tin) is a chemotherapy drug. It targets fast dividing cells, like cancer cells, and causes these cells to die. This medicine is used to treat cancers of the colon and rectum, and many other cancers. This medicine may be used for other purposes; ask your health care provider or pharmacist if you have questions. COMMON BRAND NAME(S): Eloxatin What should I tell my care team before I take this medication? They need to know if you have any of these conditions: heart disease history of irregular heartbeat liver disease low blood counts, like white cells, platelets, or red blood cells lung or breathing disease, like asthma take medicines that treat or prevent blood clots tingling of the fingers or toes, or other nerve disorder an unusual or allergic reaction to oxaliplatin, other chemotherapy, other medicines, foods, dyes, or preservatives pregnant or trying to get pregnant breast-feeding How should I use this medication? This drug is given as an infusion into a vein. It is administered in a hospital or clinic by a specially trained health care professional. Talk to your pediatrician regarding the use of this medicine in children. Special care may be needed. Overdosage: If you think you have taken too much of this medicine contact a poison control center or emergency room at once. NOTE: This medicine is only  for you. Do not share this medicine with others. What if I miss a dose? It is important not to miss a dose. Call your doctor or health care professional if you are unable to keep an appointment. What may interact with this medication? Do not take this medicine with any of the following medications: cisapride dronedarone pimozide thioridazine This medicine may also interact with the following medications: aspirin and aspirin-like medicines certain medicines that treat or prevent blood clots like warfarin, apixaban, dabigatran, and rivaroxaban cisplatin cyclosporine diuretics medicines for infection like acyclovir, adefovir, amphotericin B, bacitracin, cidofovir, foscarnet, ganciclovir, gentamicin, pentamidine, vancomycin NSAIDs, medicines for pain and inflammation, like ibuprofen or naproxen other medicines that prolong the QT interval (an abnormal heart rhythm) pamidronate zoledronic acid This list may not describe all possible interactions. Give your health care provider a list of all the medicines, herbs, non-prescription drugs, or dietary supplements you use. Also tell them if you smoke, drink alcohol, or use illegal drugs. Some items may interact with your medicine. What should I watch for while using this medication? Your condition will be monitored carefully while you are receiving this medicine. You may  need blood work done while you are taking this medicine. This medicine may make you feel generally unwell. This is not uncommon as chemotherapy can affect healthy cells as well as cancer cells. Report any side effects. Continue your course of treatment even though you feel ill unless your healthcare professional tells you to stop. This medicine can make you more sensitive to cold. Do not drink cold drinks or use ice. Cover exposed skin before coming in contact with cold temperatures or cold objects. When out in cold weather wear warm clothing and cover your mouth and nose to warm the  air that goes into your lungs. Tell your doctor if you get sensitive to the cold. Do not become pregnant while taking this medicine or for 9 months after stopping it. Women should inform their health care professional if they wish to become pregnant or think they might be pregnant. Men should not father a child while taking this medicine and for 6 months after stopping it. There is potential for serious side effects to an unborn child. Talk to your health care professional for more information. Do not breast-feed a child while taking this medicine or for 3 months after stopping it. This medicine has caused ovarian failure in some women. This medicine may make it more difficult to get pregnant. Talk to your health care professional if you are concerned about your fertility. This medicine has caused decreased sperm counts in some men. This may make it more difficult to father a child. Talk to your health care professional if you are concerned about your fertility. This medicine may increase your risk of getting an infection. Call your health care professional for advice if you get a fever, chills, or sore throat, or other symptoms of a cold or flu. Do not treat yourself. Try to avoid being around people who are sick. Avoid taking medicines that contain aspirin, acetaminophen, ibuprofen, naproxen, or ketoprofen unless instructed by your health care professional. These medicines may hide a fever. Be careful brushing or flossing your teeth or using a toothpick because you may get an infection or bleed more easily. If you have any dental work done, tell your dentist you are receiving this medicine. What side effects may I notice from receiving this medication? Side effects that you should report to your doctor or health care professional as soon as possible: allergic reactions like skin rash, itching or hives, swelling of the face, lips, or tongue breathing problems cough low blood counts - this medicine may  decrease the number of white blood cells, red blood cells, and platelets. You may be at increased risk for infections and bleeding nausea, vomiting pain, redness, or irritation at site where injected pain, tingling, numbness in the hands or feet signs and symptoms of bleeding such as bloody or black, tarry stools; red or dark brown urine; spitting up blood or brown material that looks like coffee grounds; red spots on the skin; unusual bruising or bleeding from the eyes, gums, or nose signs and symptoms of a dangerous change in heartbeat or heart rhythm like chest pain; dizziness; fast, irregular heartbeat; palpitations; feeling faint or lightheaded; falls signs and symptoms of infection like fever; chills; cough; sore throat; pain or trouble passing urine signs and symptoms of liver injury like dark yellow or brown urine; general ill feeling or flu-like symptoms; light-colored stools; loss of appetite; nausea; right upper belly pain; unusually weak or tired; yellowing of the eyes or skin signs and symptoms of low red blood  cells or anemia such as unusually weak or tired; feeling faint or lightheaded; falls signs and symptoms of muscle injury like dark urine; trouble passing urine or change in the amount of urine; unusually weak or tired; muscle pain; back pain Side effects that usually do not require medical attention (report to your doctor or health care professional if they continue or are bothersome): changes in taste diarrhea gas hair loss loss of appetite mouth sores This list may not describe all possible side effects. Call your doctor for medical advice about side effects. You may report side effects to FDA at 1-800-FDA-1088. Where should I keep my medication? This drug is given in a hospital or clinic and will not be stored at home. NOTE: This sheet is a summary. It may not cover all possible information. If you have questions about this medicine, talk to your doctor, pharmacist, or  health care provider.  2022 Elsevier/Gold Standard (2021-06-29 00:00:00)  Leucovorin injection What is this medication? LEUCOVORIN (loo koe VOR in) is used to prevent or treat the harmful effects of some medicines. This medicine is used to treat anemia caused by a low amount of folic acid in the body. It is also used with 5-fluorouracil (5-FU) to treat colon cancer. This medicine may be used for other purposes; ask your health care provider or pharmacist if you have questions. What should I tell my care team before I take this medication? They need to know if you have any of these conditions: anemia from low levels of vitamin B-12 in the blood an unusual or allergic reaction to leucovorin, folic acid, other medicines, foods, dyes, or preservatives pregnant or trying to get pregnant breast-feeding How should I use this medication? This medicine is for injection into a muscle or into a vein. It is given by a health care professional in a hospital or clinic setting. Talk to your pediatrician regarding the use of this medicine in children. Special care may be needed. Overdosage: If you think you have taken too much of this medicine contact a poison control center or emergency room at once. NOTE: This medicine is only for you. Do not share this medicine with others. What if I miss a dose? This does not apply. What may interact with this medication? capecitabine fluorouracil phenobarbital phenytoin primidone trimethoprim-sulfamethoxazole This list may not describe all possible interactions. Give your health care provider a list of all the medicines, herbs, non-prescription drugs, or dietary supplements you use. Also tell them if you smoke, drink alcohol, or use illegal drugs. Some items may interact with your medicine. What should I watch for while using this medication? Your condition will be monitored carefully while you are receiving this medicine. This medicine may increase the side  effects of 5-fluorouracil, 5-FU. Tell your doctor or health care professional if you have diarrhea or mouth sores that do not get better or that get worse. What side effects may I notice from receiving this medication? Side effects that you should report to your doctor or health care professional as soon as possible: allergic reactions like skin rash, itching or hives, swelling of the face, lips, or tongue breathing problems fever, infection mouth sores unusual bleeding or bruising unusually weak or tired Side effects that usually do not require medical attention (report to your doctor or health care professional if they continue or are bothersome): constipation or diarrhea loss of appetite nausea, vomiting This list may not describe all possible side effects. Call your doctor for medical advice about side  effects. You may report side effects to FDA at 1-800-FDA-1088. Where should I keep my medication? This drug is given in a hospital or clinic and will not be stored at home. NOTE: This sheet is a summary. It may not cover all possible information. If you have questions about this medicine, talk to your doctor, pharmacist, or health care provider.  2022 Elsevier/Gold Standard (2008-04-17 00:00:00)  Fluorouracil, 5-FU injection What is this medication? FLUOROURACIL, 5-FU (flure oh YOOR a sil) is a chemotherapy drug. It slows the growth of cancer cells. This medicine is used to treat many types of cancer like breast cancer, colon or rectal cancer, pancreatic cancer, and stomach cancer. This medicine may be used for other purposes; ask your health care provider or pharmacist if you have questions. COMMON BRAND NAME(S): Adrucil What should I tell my care team before I take this medication? They need to know if you have any of these conditions: blood disorders dihydropyrimidine dehydrogenase (DPD) deficiency infection (especially a virus infection such as chickenpox, cold sores, or  herpes) kidney disease liver disease malnourished, poor nutrition recent or ongoing radiation therapy an unusual or allergic reaction to fluorouracil, other chemotherapy, other medicines, foods, dyes, or preservatives pregnant or trying to get pregnant breast-feeding How should I use this medication? This drug is given as an infusion or injection into a vein. It is administered in a hospital or clinic by a specially trained health care professional. Talk to your pediatrician regarding the use of this medicine in children. Special care may be needed. Overdosage: If you think you have taken too much of this medicine contact a poison control center or emergency room at once. NOTE: This medicine is only for you. Do not share this medicine with others. What if I miss a dose? It is important not to miss your dose. Call your doctor or health care professional if you are unable to keep an appointment. What may interact with this medication? Do not take this medicine with any of the following medications: live virus vaccines This medicine may also interact with the following medications: medicines that treat or prevent blood clots like warfarin, enoxaparin, and dalteparin This list may not describe all possible interactions. Give your health care provider a list of all the medicines, herbs, non-prescription drugs, or dietary supplements you use. Also tell them if you smoke, drink alcohol, or use illegal drugs. Some items may interact with your medicine. What should I watch for while using this medication? Visit your doctor for checks on your progress. This drug may make you feel generally unwell. This is not uncommon, as chemotherapy can affect healthy cells as well as cancer cells. Report any side effects. Continue your course of treatment even though you feel ill unless your doctor tells you to stop. In some cases, you may be given additional medicines to help with side effects. Follow all directions  for their use. Call your doctor or health care professional for advice if you get a fever, chills or sore throat, or other symptoms of a cold or flu. Do not treat yourself. This drug decreases your body's ability to fight infections. Try to avoid being around people who are sick. This medicine may increase your risk to bruise or bleed. Call your doctor or health care professional if you notice any unusual bleeding. Be careful brushing and flossing your teeth or using a toothpick because you may get an infection or bleed more easily. If you have any dental work done, tell your dentist  you are receiving this medicine. Avoid taking products that contain aspirin, acetaminophen, ibuprofen, naproxen, or ketoprofen unless instructed by your doctor. These medicines may hide a fever. Do not become pregnant while taking this medicine. Women should inform their doctor if they wish to become pregnant or think they might be pregnant. There is a potential for serious side effects to an unborn child. Talk to your health care professional or pharmacist for more information. Do not breast-feed an infant while taking this medicine. Men should inform their doctor if they wish to father a child. This medicine may lower sperm counts. Do not treat diarrhea with over the counter products. Contact your doctor if you have diarrhea that lasts more than 2 days or if it is severe and watery. This medicine can make you more sensitive to the sun. Keep out of the sun. If you cannot avoid being in the sun, wear protective clothing and use sunscreen. Do not use sun lamps or tanning beds/booths. What side effects may I notice from receiving this medication? Side effects that you should report to your doctor or health care professional as soon as possible: allergic reactions like skin rash, itching or hives, swelling of the face, lips, or tongue low blood counts - this medicine may decrease the number of white blood cells, red blood cells  and platelets. You may be at increased risk for infections and bleeding. signs of infection - fever or chills, cough, sore throat, pain or difficulty passing urine signs of decreased platelets or bleeding - bruising, pinpoint red spots on the skin, black, tarry stools, blood in the urine signs of decreased red blood cells - unusually weak or tired, fainting spells, lightheadedness breathing problems changes in vision chest pain mouth sores nausea and vomiting pain, swelling, redness at site where injected pain, tingling, numbness in the hands or feet redness, swelling, or sores on hands or feet stomach pain unusual bleeding Side effects that usually do not require medical attention (report to your doctor or health care professional if they continue or are bothersome): changes in finger or toe nails diarrhea dry or itchy skin hair loss headache loss of appetite sensitivity of eyes to the light stomach upset unusually teary eyes This list may not describe all possible side effects. Call your doctor for medical advice about side effects. You may report side effects to FDA at 1-800-FDA-1088. Where should I keep my medication? This drug is given in a hospital or clinic and will not be stored at home. NOTE: This sheet is a summary. It may not cover all possible information. If you have questions about this medicine, talk to your doctor, pharmacist, or health care provider.  2022 Elsevier/Gold Standard (2021-06-29 00:00:00)  Hypokalemia Hypokalemia means that the amount of potassium in the blood is lower than normal. Potassium is a chemical (electrolyte) that helps regulate the amount of fluid in the body. It also stimulates muscle tightening (contraction) and helps nerves work properly. Normally, most of the body's potassium is inside cells, and only a very small amount is in the blood. Because the amount in the blood is so small, minor changes to potassium levels in the blood can be  life-threatening. What are the causes? This condition may be caused by: Antibiotic medicine. Diarrhea or vomiting. Taking too much of a medicine that helps you have a bowel movement (laxative) can cause diarrhea and lead to hypokalemia. Chronic kidney disease (CKD). Medicines that help the body get rid of excess fluid (diuretics). Eating disorders, such  as bulimia. Low magnesium levels in the body. Sweating a lot. What are the signs or symptoms? Symptoms of this condition include: Weakness. Constipation. Fatigue. Muscle cramps. Mental confusion. Skipped heartbeats or irregular heartbeat (palpitations). Tingling or numbness. How is this diagnosed? This condition is diagnosed with a blood test. How is this treated? This condition may be treated by: Taking potassium supplements by mouth. Adjusting the medicines that you take. Eating more foods that contain a lot of potassium. If your potassium level is very low, you may need to get potassium through an IV and be monitored in the hospital. Follow these instructions at home:  Take over-the-counter and prescription medicines only as told by your health care provider. This includes vitamins and supplements. Eat a healthy diet. A healthy diet includes fresh fruits and vegetables, whole grains, healthy fats, and lean proteins. If instructed, eat more foods that contain a lot of potassium. This includes: Nuts, such as peanuts and pistachios. Seeds, such as sunflower seeds and pumpkin seeds. Peas, lentils, and lima beans. Whole grain and bran cereals and breads. Fresh fruits and vegetables, such as apricots, avocado, bananas, cantaloupe, kiwi, oranges, tomatoes, asparagus, and potatoes. Orange juice. Tomato juice. Red meats. Yogurt. Keep all follow-up visits as told by your health care provider. This is important. Contact a health care provider if you: Have weakness that gets worse. Feel your heart pounding or racing. Vomit. Have  diarrhea. Have diabetes (diabetes mellitus) and you have trouble keeping your blood sugar (glucose) in your target range. Get help right away if you: Have chest pain. Have shortness of breath. Have vomiting or diarrhea that lasts for more than 2 days. Faint. Summary Hypokalemia means that the amount of potassium in the blood is lower than normal. This condition is diagnosed with a blood test. Hypokalemia may be treated by taking potassium supplements, adjusting the medicines that you take, or eating more foods that are high in potassium. If your potassium level is very low, you may need to get potassium through an IV and be monitored in the hospital. This information is not intended to replace advice given to you by your health care provider. Make sure you discuss any questions you have with your health care provider. Document Revised: 05/22/2018 Document Reviewed: 05/23/2018 Elsevier Patient Education  [place].

## 2021-12-24 ENCOUNTER — Telehealth: Payer: Self-pay | Admitting: Hematology

## 2021-12-24 NOTE — Telephone Encounter (Signed)
Scheduled follow-up appointments per 3/2 los. Patient's wife is aware. ?

## 2021-12-25 ENCOUNTER — Other Ambulatory Visit: Payer: Self-pay

## 2021-12-25 ENCOUNTER — Inpatient Hospital Stay: Payer: Medicare PPO

## 2021-12-25 VITALS — BP 149/88 | HR 73 | Temp 97.8°F | Resp 17 | Ht 69.5 in

## 2021-12-25 DIAGNOSIS — Z5111 Encounter for antineoplastic chemotherapy: Secondary | ICD-10-CM | POA: Diagnosis not present

## 2021-12-25 DIAGNOSIS — C182 Malignant neoplasm of ascending colon: Secondary | ICD-10-CM

## 2021-12-25 MED ORDER — SODIUM CHLORIDE 0.9% FLUSH
10.0000 mL | INTRAVENOUS | Status: DC | PRN
  Administered 2021-12-25: 10 mL

## 2021-12-25 MED ORDER — HEPARIN SOD (PORK) LOCK FLUSH 100 UNIT/ML IV SOLN
500.0000 [IU] | Freq: Once | INTRAVENOUS | Status: AC | PRN
  Administered 2021-12-25: 500 [IU]

## 2021-12-30 ENCOUNTER — Other Ambulatory Visit: Payer: Self-pay

## 2022-01-05 MED FILL — Dexamethasone Sodium Phosphate Inj 100 MG/10ML: INTRAMUSCULAR | Qty: 1 | Status: AC

## 2022-01-06 ENCOUNTER — Inpatient Hospital Stay: Payer: Medicare PPO

## 2022-01-06 ENCOUNTER — Other Ambulatory Visit: Payer: Self-pay

## 2022-01-06 ENCOUNTER — Inpatient Hospital Stay (HOSPITAL_BASED_OUTPATIENT_CLINIC_OR_DEPARTMENT_OTHER): Payer: Medicare PPO | Admitting: Hematology

## 2022-01-06 ENCOUNTER — Encounter: Payer: Self-pay | Admitting: Hematology

## 2022-01-06 VITALS — BP 140/80 | HR 57 | Temp 98.3°F | Resp 17 | Ht 69.5 in | Wt 226.6 lb

## 2022-01-06 DIAGNOSIS — C182 Malignant neoplasm of ascending colon: Secondary | ICD-10-CM | POA: Diagnosis not present

## 2022-01-06 DIAGNOSIS — Z5111 Encounter for antineoplastic chemotherapy: Secondary | ICD-10-CM | POA: Diagnosis not present

## 2022-01-06 LAB — CMP (CANCER CENTER ONLY)
ALT: 15 U/L (ref 0–44)
AST: 17 U/L (ref 15–41)
Albumin: 3.8 g/dL (ref 3.5–5.0)
Alkaline Phosphatase: 70 U/L (ref 38–126)
Anion gap: 3 — ABNORMAL LOW (ref 5–15)
BUN: 11 mg/dL (ref 8–23)
CO2: 32 mmol/L (ref 22–32)
Calcium: 9.1 mg/dL (ref 8.9–10.3)
Chloride: 102 mmol/L (ref 98–111)
Creatinine: 1.17 mg/dL (ref 0.61–1.24)
GFR, Estimated: >60 mL/min (ref >60)
Glucose, Bld: 135 mg/dL — ABNORMAL HIGH (ref 70–99)
Potassium: 3.3 mmol/L — ABNORMAL LOW (ref 3.5–5.1)
Sodium: 137 mmol/L (ref 135–145)
Total Bilirubin: 0.5 mg/dL (ref 0.3–1.2)
Total Protein: 7.3 g/dL (ref 6.5–8.1)

## 2022-01-06 LAB — CBC WITH DIFFERENTIAL (CANCER CENTER ONLY)
Abs Immature Granulocytes: 0.02 10*3/uL (ref 0.00–0.07)
Basophils Absolute: 0.1 10*3/uL (ref 0.0–0.1)
Basophils Relative: 1 %
Eosinophils Absolute: 0.2 10*3/uL (ref 0.0–0.5)
Eosinophils Relative: 3 %
HCT: 35.2 % — ABNORMAL LOW (ref 39.0–52.0)
Hemoglobin: 11 g/dL — ABNORMAL LOW (ref 13.0–17.0)
Immature Granulocytes: 0 %
Lymphocytes Relative: 24 %
Lymphs Abs: 1.3 10*3/uL (ref 0.7–4.0)
MCH: 26.4 pg (ref 26.0–34.0)
MCHC: 31.3 g/dL (ref 30.0–36.0)
MCV: 84.4 fL (ref 80.0–100.0)
Monocytes Absolute: 0.7 10*3/uL (ref 0.1–1.0)
Monocytes Relative: 13 %
Neutro Abs: 3.2 10*3/uL (ref 1.7–7.7)
Neutrophils Relative %: 59 %
Platelet Count: 227 10*3/uL (ref 150–400)
RBC: 4.17 MIL/uL — ABNORMAL LOW (ref 4.22–5.81)
RDW: 13.7 % (ref 11.5–15.5)
WBC Count: 5.5 10*3/uL (ref 4.0–10.5)
nRBC: 0 % (ref 0.0–0.2)

## 2022-01-06 LAB — SAMPLE TO BLOOD BANK

## 2022-01-06 MED ORDER — SODIUM CHLORIDE 0.9 % IV SOLN
2400.0000 mg/m2 | INTRAVENOUS | Status: DC
  Administered 2022-01-06: 5350 mg via INTRAVENOUS
  Filled 2022-01-06: qty 107

## 2022-01-06 MED ORDER — PALONOSETRON HCL INJECTION 0.25 MG/5ML
0.2500 mg | Freq: Once | INTRAVENOUS | Status: AC
  Administered 2022-01-06: 0.25 mg via INTRAVENOUS
  Filled 2022-01-06: qty 5

## 2022-01-06 MED ORDER — FLUOROURACIL CHEMO INJECTION 2.5 GM/50ML
400.0000 mg/m2 | Freq: Once | INTRAVENOUS | Status: AC
  Administered 2022-01-06: 900 mg via INTRAVENOUS
  Filled 2022-01-06: qty 18

## 2022-01-06 MED ORDER — OXALIPLATIN CHEMO INJECTION 100 MG/20ML
85.0000 mg/m2 | Freq: Once | INTRAVENOUS | Status: AC
  Administered 2022-01-06: 190 mg via INTRAVENOUS
  Filled 2022-01-06: qty 38

## 2022-01-06 MED ORDER — SODIUM CHLORIDE 0.9 % IV SOLN
10.0000 mg | Freq: Once | INTRAVENOUS | Status: AC
  Administered 2022-01-06: 10 mg via INTRAVENOUS
  Filled 2022-01-06: qty 10

## 2022-01-06 MED ORDER — DEXTROSE 5 % IV SOLN: Freq: Once | INTRAVENOUS | Status: AC

## 2022-01-06 MED ORDER — SODIUM CHLORIDE 0.9% FLUSH
10.0000 mL | Freq: Once | INTRAVENOUS | Status: AC
  Administered 2022-01-06: 10 mL

## 2022-01-06 MED ORDER — LEUCOVORIN CALCIUM INJECTION 350 MG
400.0000 mg/m2 | Freq: Once | INTRAVENOUS | Status: AC
  Administered 2022-01-06: 892 mg via INTRAVENOUS
  Filled 2022-01-06: qty 44.6

## 2022-01-06 NOTE — Progress Notes (Signed)
?[facility]   ?[phone] [phone]   ?Clinic Follow up Note  ? ?Patient Care Team: ?Arthur Holms, NP as PCP - General (Nurse Practitioner) ?Truitt Merle, MD as Consulting Physician (Oncology) ? ?Date of Service:  01/06/2022 ? ?CHIEF COMPLAINT: f/u of colorectal cancer ? ?CURRENT THERAPY:  ?Adjuvant FOLFOX, to start 12/23/21 ? ?ASSESSMENT & PLAN:  ?George Watkins is a 71 y.o. male with  ? ?1. Colorectal cancer, adenocarcinoma of the ascending colon, pT3N2aMx, at least stage IIIB, MSS ?-Colonoscopy performed on 10/13/21 for IDA and abdominal pain showed a 4 cm mass near the hepatic flexure. Pathology confirmed adenocarcinoma, MMR normal. ?-Baseline CEA was elevated at 25.9 on 10/13/21 ?-CT C/A/P on 10/21/21 showed: circumferential mass in the mid ascending colon; mildly prominent, asymmetric right external iliac lymph node, nonspecific but suspicious.   ?-PET scan on 11/09/21 showed: hypermetabolism to colon mass and adjacent mildly enlarged ileocolonic mesenteric lymph nodes. Otherwise, no definitive evidence of additional spread. ?-partial colectomy on 11/17/21 with Dr. Marcello Moores showed 5.6 cm invasive moderately differentiated adenocarcinoma invading into pericolic adipose. Margins negative, but metastatic carcinoma in 6 lymph nodes (6/15). Two additional tubular adenomas without high-grade dysplasia noted. MMR normal and MSI stable.  ?-given his multiple positive lymph nodes, I recommend adjuvant chemotherapy with FOLFOX for 6 months.  ?-He began treatment on 12/23/21 and tolerated very well overall with mild side effects. ?-labs reviewed, adequate to proceed with C2 FOLFOX today as scheduled.  ?  ?2.  Iron deficiency anemia  ?-Patient currently taking ferrous sulfate BID and compliant.  Had good response to oral iron supplement.  Has not required iron infusion or blood transfusion.  ?-hgb overall stable since surgery, 11 today (01/06/22) ?  ?3. Renal Lesion ?-incidental finding on CT CAP  10/21/21 ?-PET on 11/09/21 showed no metabolic activity, favored to reflect hemorrhagic/proteinaceous cysts. Plan to get renal US after his surgery  ?  ?4.  Left axillary adenopathy ?-Seen on initial CT scan, positive on PET scan in January 2023. ?-Attempted biopsy, ultrasound showed normal-appearing left axillary adenopathy, biopsy was deferred. ?-will f/u by CT scan in May or June  ?  ?5. Rash around port ?-Likely allergy reaction to adhesive ?-use topical hydrocortisone  ?  ? ?PLAN: ?-proceed with C2 FOLFOX today  ?-lab, flush, and f/u in 2 and 4 weeks as scheduled ? ? ?No problem-specific Assessment & Plan notes found for this encounter. ? ? ?SUMMARY OF ONCOLOGIC HISTORY: ?Oncology History Overview Note  ? Cancer Staging  ?Colorectal cancer ([place]) ?Staging form: Colon and Rectum, AJCC 8th Edition ?- Pathologic stage from 11/17/2021: Stage IIIB (pT3, pN2a, cM0) - Signed by Truitt Merle, MD on 12/03/2021 ? ?  ?Cancer of right colon [facility])  ?10/13/2021 Procedure  ? Upper GI endoscopy by Dr. Ardis Hughs for IDA and abdominal pain ? ?Findings:  ?- The esophagus was normal. ?- The stomach was normal. ?- The examined duodenum was normal. ?  ?10/13/2021 Procedure  ? Colonoscopy ?Indications: Iron deficiency ? ?- A fungating and ulcerated partially obstructing, clearly malignant mass was found at the ?hepatic flexure. The mass was circumferential and measured 4cm in length. Biopsies were ?taken with a cold forceps for histology and then the mucosa just distal to the mass was ?tattooed with an injection of Spot (carbon black). jar 1 ?Findings: ?- Two pedunculated polyps were found in the mid transverse colon. The polyps were 8 to 11 ?mm in size. These polyps were removed with a hot snare. Resection and retrieval  were ?complete. jar 2 ?- Five pedunculated and sessile polyps were found in the distal transverse colon. The polyps ?were 4 to 14 mm in size. Two were removed with hot snare, three were removed with cold ?snare. Resection and  retrieval were complete. jar 3 ?- A 20 mm polyp was found in the distal transverse colon. The polyp was pedunculated. The ?polyp was removed with a hot snare. Resection and retrieval were complete. jar 4 ?- The exam was otherwise without abnormality on direct and retroflexion views. ?  ?10/13/2021 Pathology Results  ? REPORT OF SURGICAL PATHOLOGY ?Accession: [id number] ? ?Diagnosis ?1. Hepatic Flexure Biopsy ?- ADENOCARCINOMA, SEE NOTE ?  ?10/21/2021 Imaging  ? CT CHEST ABDOMEN PELVIS W CONTRAST  ?IMPRESSION: ?1. There is a circumferential mass of the mid ascending colon ?measuring approximately 5 cm in length, in keeping with newly ?diagnosed primary colon malignancy. ?2. Adjacent to the colon mass, there is mesenteric fat stranding ?suggesting invasion beyond the muscularis as well as abnormally ?enlarged mesocolonic lymph nodes. ?3. Mildly prominent, asymmetric right external iliac lymph nodes, ?nonspecific although suspicious for early nodal metastatic disease. ?4. Asymmetrically prominent left axillary lymph nodes, nonspecific, ?these nodes less favored to reflect metastatic disease given ?location in the absence of other evidence of metastatic disease ?above the diaphragm. Both external iliac and axillary lymph nodes ?could be characterized for abnormal metabolic activity by PET-CT. ?5. No evidence of organ metastatic disease in the chest, abdomen, or ?pelvis. ?6. Exophytic soft tissue attenuation lesion of the anterior ?midportion of the left kidney measuring 1.2 cm. Partially exophytic ?soft tissue attenuation lesion of the posteroinferior pole of the ?left kidney measuring 1.9 x 1.7 cm. These may reflect hemorrhagic or ?proteinaceous cysts but are incompletely characterized and ?incidental renal cell carcinoma is not excluded. Recommend initial ?renal ultrasound to establish definitively cystic character, ?multiphasic contrast enhanced CT or MRI may however be further ?required to exclude malignancy. ?7.  Prostatomegaly with thickening of the decompressed urinary ?bladder wall, likely due to chronic outlet obstruction. ?  ?Aortic Atherosclerosis (ICD10-I70.0). ?  ?10/28/2021 Initial Diagnosis  ? Colorectal cancer [facility]) ?  ?11/17/2021 Cancer Staging  ? Staging form: Colon and Rectum, AJCC 8th Edition ?- Pathologic stage from 11/17/2021: Stage IIIB (pT3, pN2a, cM0) - Signed by Truitt Merle, MD on 12/03/2021 ?Histologic grading system: 4 grade system ?Histologic grade (G): G2 ?Residual tumor (R): R0 - None ? ?  ?11/17/2021 Definitive Surgery  ? FINAL MICROSCOPIC DIAGNOSIS:  ? ?A. COLON, RIGHT, RESECTION:  ?Invasive moderately differentiated adenocarcinoma invading into  ?pericolic adipose (pT3)  ?Tumor measures 5.2 x 3.6 x 0.8 cm  ?Margins free of carcinoma and adenomatous change  ?Six of fifteen pericolic lymph nodes with metastatic carcinoma (6/15, pN2a)  ?Two additional tubular adenomas without high-grade dysplasia, largest measuring 1.3 cm  ?Benign appendix  ? ?ADDENDUM:  ?Mismatch Repair Protein (IHC)  ?SUMMARY INTERPRETATION: NORMAL ? ?Microsatellite Instability (MSI) ?Result: STABLE ?  ?12/23/2021 -  Chemotherapy  ? Patient is on Treatment Plan : COLORECTAL FOLFOX q14d x 6 months  ?   ? ? ? ?INTERVAL HISTORY:  ?George Watkins is here for a follow up of colorectal cancer. He was last seen by me on 12/23/21. He presents to the clinic alone. ?He reports he tolerated first cycle chemo very well overall. He notes he had only mild cold sensitivity. He reports he has been eating well. ?  ?All other systems were reviewed with the patient and are negative. ? ?MEDICAL HISTORY:  ?Past Medical  History:  ?Diagnosis Date  ? Anemia   ? Arthritis   ? Colon cancer ([place])   ? Coronary artery disease   ? GERD (gastroesophageal reflux disease)   ? Heart murmur   ? Hiatal hernia   ? Hypertension   ? Pneumonia   ? Sleep apnea   ? ? ?SURGICAL HISTORY: ?Past Surgical History:  ?Procedure Laterality Date  ? ANKLE SURGERY    ? CIRCUMCISION    ?  COLONOSCOPY    ? HERNIA REPAIR    ? PORTACATH PLACEMENT N/A 12/09/2021  ? Procedure: INSERTION PORT-A-CATH with ultrasound;  Surgeon: Leighton Ruff, MD;  Location: [facility];  Service: General;  Laterality: N/A;

## 2022-01-06 NOTE — Patient Instructions (Signed)
[facility]  Discharge Instructions: ?Thank you for choosing [facility] to provide your oncology and hematology care.  ? ?If you have a lab appointment with the [facility], please go directly to the [facility] and check in at the registration area. ?  ?Wear comfortable clothing and clothing appropriate for easy access to any Portacath or PICC line.  ? ?We strive to give you quality time with your provider. You may need to reschedule your appointment if you arrive late (15 or more minutes).  Arriving late affects you and other patients whose appointments are after yours.  Also, if you miss three or more appointments without notifying the office, you may be dismissed from the clinic at the provider?s discretion.    ?  ?For prescription refill requests, have your pharmacy contact our office and allow 72 hours for refills to be completed.   ? ?Today you received the following chemotherapy and/or immunotherapy agents: Oxaliplatin and Fluorouracil     ?  ?To help prevent nausea and vomiting after your treatment, we encourage you to take your nausea medication as directed. ? ?BELOW ARE SYMPTOMS THAT SHOULD BE REPORTED IMMEDIATELY: ?*FEVER GREATER THAN 100.4 F (38 ?C) OR HIGHER ?*CHILLS OR SWEATING ?*NAUSEA AND VOMITING THAT IS NOT CONTROLLED WITH YOUR NAUSEA MEDICATION ?*UNUSUAL SHORTNESS OF BREATH ?*UNUSUAL BRUISING OR BLEEDING ?*URINARY PROBLEMS (pain or burning when urinating, or frequent urination) ?*BOWEL PROBLEMS (unusual diarrhea, constipation, pain near the anus) ?TENDERNESS IN MOUTH AND THROAT WITH OR WITHOUT PRESENCE OF ULCERS (sore throat, sores in mouth, or a toothache) ?UNUSUAL RASH, SWELLING OR PAIN  ?UNUSUAL VAGINAL DISCHARGE OR ITCHING  ? ?Items with * indicate a potential emergency and should be followed up as soon as possible or go to the Emergency Department if any problems should occur. ? ?Please show the CHEMOTHERAPY ALERT CARD or IMMUNOTHERAPY ALERT  CARD at check-in to the Emergency Department and triage nurse. ? ?Should you have questions after your visit or need to cancel or reschedule your appointment, please contact [facility]  Dept: [phone]  and follow the prompts.  Office hours are 8:00 a.m. to 4:30 p.m. Monday - Friday. Please note that voicemails left after 4:00 p.m. may not be returned until the following business day.  We are closed weekends and major holidays. You have access to a nurse at all times for urgent questions. Please call the main number to the clinic Dept: [phone] and follow the prompts. ? ? ?For any non-urgent questions, you may also contact your provider using MyChart. We now offer e-Visits for anyone 70 and older to request care online for non-urgent symptoms. For details visit [email or web address]. ?  ?Also download the MyChart app! Go to the app store, search "MyChart", open the app, select [facility], and log in with your MyChart username and password. ? ?Due to Covid, a mask is required upon entering the hospital/clinic. If you do not have a mask, one will be given to you upon arrival. For doctor visits, patients may have 1 support person aged 78 or older with them. For treatment visits, patients cannot have anyone with them due to current Covid guidelines and our immunocompromised population.  ? ?

## 2022-01-08 ENCOUNTER — Inpatient Hospital Stay: Payer: Medicare PPO

## 2022-01-08 ENCOUNTER — Other Ambulatory Visit: Payer: Self-pay

## 2022-01-08 VITALS — BP 188/100 | HR 65 | Temp 98.7°F | Resp 19

## 2022-01-08 DIAGNOSIS — C182 Malignant neoplasm of ascending colon: Secondary | ICD-10-CM

## 2022-01-08 DIAGNOSIS — Z5111 Encounter for antineoplastic chemotherapy: Secondary | ICD-10-CM | POA: Diagnosis not present

## 2022-01-08 MED ORDER — HEPARIN SOD (PORK) LOCK FLUSH 100 UNIT/ML IV SOLN
500.0000 [IU] | Freq: Once | INTRAVENOUS | Status: AC | PRN
  Administered 2022-01-08: 500 [IU]

## 2022-01-08 MED ORDER — SODIUM CHLORIDE 0.9% FLUSH
10.0000 mL | INTRAVENOUS | Status: DC | PRN
  Administered 2022-01-08: 10 mL

## 2022-01-17 ENCOUNTER — Other Ambulatory Visit: Payer: Medicare PPO

## 2022-01-20 ENCOUNTER — Inpatient Hospital Stay: Payer: Medicare PPO

## 2022-01-20 ENCOUNTER — Inpatient Hospital Stay (HOSPITAL_BASED_OUTPATIENT_CLINIC_OR_DEPARTMENT_OTHER): Payer: Medicare PPO | Admitting: Hematology

## 2022-01-20 ENCOUNTER — Encounter: Payer: Self-pay | Admitting: Hematology

## 2022-01-20 ENCOUNTER — Other Ambulatory Visit: Payer: Self-pay

## 2022-01-20 VITALS — BP 169/81 | HR 55 | Temp 98.5°F | Resp 18 | Ht 69.5 in | Wt 223.6 lb

## 2022-01-20 DIAGNOSIS — C182 Malignant neoplasm of ascending colon: Secondary | ICD-10-CM

## 2022-01-20 DIAGNOSIS — C19 Malignant neoplasm of rectosigmoid junction: Secondary | ICD-10-CM

## 2022-01-20 DIAGNOSIS — Z95828 Presence of other vascular implants and grafts: Secondary | ICD-10-CM

## 2022-01-20 DIAGNOSIS — Z5111 Encounter for antineoplastic chemotherapy: Secondary | ICD-10-CM | POA: Diagnosis not present

## 2022-01-20 LAB — CBC WITH DIFFERENTIAL (CANCER CENTER ONLY)
Abs Immature Granulocytes: 0.01 10*3/uL (ref 0.00–0.07)
Basophils Absolute: 0.1 10*3/uL (ref 0.0–0.1)
Basophils Relative: 1 %
Eosinophils Absolute: 0.1 10*3/uL (ref 0.0–0.5)
Eosinophils Relative: 2 %
HCT: 36.1 % — ABNORMAL LOW (ref 39.0–52.0)
Hemoglobin: 11.5 g/dL — ABNORMAL LOW (ref 13.0–17.0)
Immature Granulocytes: 0 %
Lymphocytes Relative: 26 %
Lymphs Abs: 1.3 10*3/uL (ref 0.7–4.0)
MCH: 26.7 pg (ref 26.0–34.0)
MCHC: 31.9 g/dL (ref 30.0–36.0)
MCV: 83.8 fL (ref 80.0–100.0)
Monocytes Absolute: 0.7 10*3/uL (ref 0.1–1.0)
Monocytes Relative: 14 %
Neutro Abs: 2.9 10*3/uL (ref 1.7–7.7)
Neutrophils Relative %: 57 %
Platelet Count: 181 10*3/uL (ref 150–400)
RBC: 4.31 MIL/uL (ref 4.22–5.81)
RDW: 14.3 % (ref 11.5–15.5)
WBC Count: 5 10*3/uL (ref 4.0–10.5)
nRBC: 0 % (ref 0.0–0.2)

## 2022-01-20 LAB — CMP (CANCER CENTER ONLY)
ALT: 16 U/L (ref 0–44)
AST: 19 U/L (ref 15–41)
Albumin: 3.7 g/dL (ref 3.5–5.0)
Alkaline Phosphatase: 66 U/L (ref 38–126)
Anion gap: 7 (ref 5–15)
BUN: 12 mg/dL (ref 8–23)
CO2: 31 mmol/L (ref 22–32)
Calcium: 9 mg/dL (ref 8.9–10.3)
Chloride: 102 mmol/L (ref 98–111)
Creatinine: 1.17 mg/dL (ref 0.61–1.24)
GFR, Estimated: >60 mL/min (ref >60)
Glucose, Bld: 204 mg/dL — ABNORMAL HIGH (ref 70–99)
Potassium: 3.1 mmol/L — ABNORMAL LOW (ref 3.5–5.1)
Sodium: 140 mmol/L (ref 135–145)
Total Bilirubin: 1 mg/dL (ref 0.3–1.2)
Total Protein: 7 g/dL (ref 6.5–8.1)

## 2022-01-20 LAB — SAMPLE TO BLOOD BANK

## 2022-01-20 MED ORDER — SODIUM CHLORIDE 0.9% FLUSH
10.0000 mL | Freq: Once | INTRAVENOUS | Status: AC
  Administered 2022-01-20: 10 mL

## 2022-01-20 MED ORDER — OXALIPLATIN CHEMO INJECTION 100 MG/20ML
85.0000 mg/m2 | Freq: Once | INTRAVENOUS | Status: AC
  Administered 2022-01-20: 190 mg via INTRAVENOUS
  Filled 2022-01-20: qty 38

## 2022-01-20 MED ORDER — FLUOROURACIL CHEMO INJECTION 2.5 GM/50ML
400.0000 mg/m2 | Freq: Once | INTRAVENOUS | Status: AC
  Administered 2022-01-20: 900 mg via INTRAVENOUS
  Filled 2022-01-20: qty 18

## 2022-01-20 MED ORDER — SODIUM CHLORIDE 0.9 % IV SOLN
2400.0000 mg/m2 | INTRAVENOUS | Status: DC
  Administered 2022-01-20: 5350 mg via INTRAVENOUS
  Filled 2022-01-20: qty 107

## 2022-01-20 MED ORDER — LEUCOVORIN CALCIUM INJECTION 350 MG
400.0000 mg/m2 | Freq: Once | INTRAVENOUS | Status: AC
  Administered 2022-01-20: 892 mg via INTRAVENOUS
  Filled 2022-01-20: qty 44.6

## 2022-01-20 MED ORDER — DEXTROSE 5 % IV SOLN: Freq: Once | INTRAVENOUS | Status: AC

## 2022-01-20 MED ORDER — PALONOSETRON HCL INJECTION 0.25 MG/5ML
INTRAVENOUS | Status: AC
  Filled 2022-01-20: qty 5

## 2022-01-20 MED ORDER — SODIUM CHLORIDE 0.9 % IV SOLN
10.0000 mg | Freq: Once | INTRAVENOUS | Status: AC
  Administered 2022-01-20: 10 mg via INTRAVENOUS
  Filled 2022-01-20: qty 10

## 2022-01-20 MED ORDER — PALONOSETRON HCL INJECTION 0.25 MG/5ML
0.2500 mg | Freq: Once | INTRAVENOUS | Status: AC
  Administered 2022-01-20: 0.25 mg via INTRAVENOUS

## 2022-01-20 NOTE — Progress Notes (Signed)
?[facility]   ?[phone] [phone]   ?Clinic Follow up Note  ? ?Patient Care Team: ?Arthur Holms, NP as PCP - General (Nurse Practitioner) ?Truitt Merle, MD as Consulting Physician (Oncology) ? ?Date of Service:  01/20/2022 ? ?CHIEF COMPLAINT: f/u of colorectal cancer ? ?CURRENT THERAPY:  ?Adjuvant FOLFOX, to start 12/23/21 ? ?ASSESSMENT & PLAN:  ?George Watkins is a 71 y.o. male with  ? ?1. Colorectal cancer, adenocarcinoma of the ascending colon, pT3N2aMx, at least stage IIIB, MSS ?-Colonoscopy performed on 10/13/21 for IDA and abdominal pain showed a 4 cm mass near the hepatic flexure. Pathology confirmed adenocarcinoma, MMR normal. ?-Baseline CEA was elevated at 25.9 on 10/13/21 ?-CT C/A/P on 10/21/21 showed: circumferential mass in the mid ascending colon; mildly prominent, asymmetric right external iliac lymph node, nonspecific but suspicious.   ?-PET scan on 11/09/21 showed: hypermetabolism to colon mass and adjacent mildly enlarged ileocolonic mesenteric lymph nodes. Otherwise, no definitive evidence of additional spread. ?-partial colectomy on 11/17/21 with Dr. Marcello Moores showed 5.6 cm invasive moderately differentiated adenocarcinoma invading into pericolic adipose. Margins negative, but metastatic carcinoma in 6 lymph nodes (6/15). Two additional tubular adenomas without high-grade dysplasia noted. MMR normal and MSI stable.  ?-given his multiple positive lymph nodes, I recommend adjuvant chemotherapy with FOLFOX for 6 months. He began treatment on 12/23/21 and tolerated very well overall with mild side effects. ?-labs reviewed, adequate to proceed with C3 FOLFOX today as scheduled.  ?  ?2.  Iron deficiency anemia  ?-Patient currently taking ferrous sulfate BID and compliant. Has had good response to oral iron supplement, has not required additional treatment. ?-hgb overall stable since surgery, up to 11.5 today (01/20/22) ?  ?3. Renal Lesion ?-incidental finding on CT CAP  10/21/21 ?-PET on 11/09/21 showed no metabolic activity, favored to reflect hemorrhagic/proteinaceous cysts. Plan to get renal US after his surgery  ?  ?4.  Left axillary adenopathy ?-Seen on initial CT scan, positive on PET scan in January 2023. ?-Attempted biopsy, ultrasound showed normal-appearing left axillary adenopathy, biopsy was deferred. ?-will f/u by CT scan in May or June  ?  ?  ?PLAN: ?-proceed with C3 FOLFOX today  ?-lab, flush, f/u, and FOLFOX in 2 and 4 weeks ? ? ?No problem-specific Assessment & Plan notes found for this encounter. ? ? ?SUMMARY OF ONCOLOGIC HISTORY: ?Oncology History Overview Note  ? Cancer Staging  ?Colorectal cancer ([place]) ?Staging form: Colon and Rectum, AJCC 8th Edition ?- Pathologic stage from 11/17/2021: Stage IIIB (pT3, pN2a, cM0) - Signed by Truitt Merle, MD on 12/03/2021 ? ?  ?Cancer of right colon [facility])  ?10/13/2021 Procedure  ? Upper GI endoscopy by Dr. Ardis Hughs for IDA and abdominal pain ? ?Findings:  ?- The esophagus was normal. ?- The stomach was normal. ?- The examined duodenum was normal. ?  ?10/13/2021 Procedure  ? Colonoscopy ?Indications: Iron deficiency ? ?- A fungating and ulcerated partially obstructing, clearly malignant mass was found at the ?hepatic flexure. The mass was circumferential and measured 4cm in length. Biopsies were ?taken with a cold forceps for histology and then the mucosa just distal to the mass was ?tattooed with an injection of Spot (carbon black). jar 1 ?Findings: ?- Two pedunculated polyps were found in the mid transverse colon. The polyps were 8 to 11 ?mm in size. These polyps were removed with a hot snare. Resection and retrieval were ?complete. jar 2 ?- Five pedunculated and sessile polyps were found in the distal transverse colon. The polyps ?  were 4 to 14 mm in size. Two were removed with hot snare, three were removed with cold ?snare. Resection and retrieval were complete. jar 3 ?- A 20 mm polyp was found in the distal transverse colon. The  polyp was pedunculated. The ?polyp was removed with a hot snare. Resection and retrieval were complete. jar 4 ?- The exam was otherwise without abnormality on direct and retroflexion views. ?  ?10/13/2021 Pathology Results  ? REPORT OF SURGICAL PATHOLOGY ?Accession: [id number] ? ?Diagnosis ?1. Hepatic Flexure Biopsy ?- ADENOCARCINOMA, SEE NOTE ?  ?10/21/2021 Imaging  ? CT CHEST ABDOMEN PELVIS W CONTRAST  ?IMPRESSION: ?1. There is a circumferential mass of the mid ascending colon ?measuring approximately 5 cm in length, in keeping with newly ?diagnosed primary colon malignancy. ?2. Adjacent to the colon mass, there is mesenteric fat stranding ?suggesting invasion beyond the muscularis as well as abnormally ?enlarged mesocolonic lymph nodes. ?3. Mildly prominent, asymmetric right external iliac lymph nodes, ?nonspecific although suspicious for early nodal metastatic disease. ?4. Asymmetrically prominent left axillary lymph nodes, nonspecific, ?these nodes less favored to reflect metastatic disease given ?location in the absence of other evidence of metastatic disease ?above the diaphragm. Both external iliac and axillary lymph nodes ?could be characterized for abnormal metabolic activity by PET-CT. ?5. No evidence of organ metastatic disease in the chest, abdomen, or ?pelvis. ?6. Exophytic soft tissue attenuation lesion of the anterior ?midportion of the left kidney measuring 1.2 cm. Partially exophytic ?soft tissue attenuation lesion of the posteroinferior pole of the ?left kidney measuring 1.9 x 1.7 cm. These may reflect hemorrhagic or ?proteinaceous cysts but are incompletely characterized and ?incidental renal cell carcinoma is not excluded. Recommend initial ?renal ultrasound to establish definitively cystic character, ?multiphasic contrast enhanced CT or MRI may however be further ?required to exclude malignancy. ?7. Prostatomegaly with thickening of the decompressed urinary ?bladder wall, likely due to chronic  outlet obstruction. ?  ?Aortic Atherosclerosis (ICD10-I70.0). ?  ?10/28/2021 Initial Diagnosis  ? Colorectal cancer [facility]) ?  ?11/17/2021 Cancer Staging  ? Staging form: Colon and Rectum, AJCC 8th Edition ?- Pathologic stage from 11/17/2021: Stage IIIB (pT3, pN2a, cM0) - Signed by Truitt Merle, MD on 12/03/2021 ?Histologic grading system: 4 grade system ?Histologic grade (G): G2 ?Residual tumor (R): R0 - None ? ?  ?11/17/2021 Definitive Surgery  ? FINAL MICROSCOPIC DIAGNOSIS:  ? ?A. COLON, RIGHT, RESECTION:  ?Invasive moderately differentiated adenocarcinoma invading into  ?pericolic adipose (pT3)  ?Tumor measures 5.2 x 3.6 x 0.8 cm  ?Margins free of carcinoma and adenomatous change  ?Six of fifteen pericolic lymph nodes with metastatic carcinoma (6/15, pN2a)  ?Two additional tubular adenomas without high-grade dysplasia, largest measuring 1.3 cm  ?Benign appendix  ? ?ADDENDUM:  ?Mismatch Repair Protein (IHC)  ?SUMMARY INTERPRETATION: NORMAL ? ?Microsatellite Instability (MSI) ?Result: STABLE ?  ?12/23/2021 -  Chemotherapy  ? Patient is on Treatment Plan : COLORECTAL FOLFOX q14d x 6 months  ?   ? ? ? ?INTERVAL HISTORY:  ?George Watkins is here for a follow up of colorectal cancer. He was last seen by me on 01/06/22. He presents to the clinic alone. ?He reports he is doing very well with treatment. He denies any new complaints and reports he is doing "wonderful." ?  ?All other systems were reviewed with the patient and are negative. ? ?MEDICAL HISTORY:  ?Past Medical History:  ?Diagnosis Date  ? Anemia   ? Arthritis   ? Colon cancer ([place])   ? Coronary artery disease   ?  GERD (gastroesophageal reflux disease)   ? Heart murmur   ? Hiatal hernia   ? Hypertension   ? Pneumonia   ? Sleep apnea   ? ? ?SURGICAL HISTORY: ?Past Surgical History:  ?Procedure Laterality Date  ? ANKLE SURGERY    ? CIRCUMCISION    ? COLONOSCOPY    ? HERNIA REPAIR    ? PORTACATH PLACEMENT N/A 12/09/2021  ? Procedure: INSERTION PORT-A-CATH with ultrasound;   Surgeon: Leighton Ruff, MD;  Location: [facility];  Service: General;  Laterality: N/A;  ? ? ?I have reviewed the social history and family history with the patient and they are unchanged from previous note. ? ?ALLERG

## 2022-01-20 NOTE — Patient Instructions (Signed)
[facility]  Discharge Instructions: ?Thank you for choosing [facility] to provide your oncology and hematology care.  ? ?If you have a lab appointment with the [facility], please go directly to the [facility] and check in at the registration area. ?  ?Wear comfortable clothing and clothing appropriate for easy access to any Portacath or PICC line.  ? ?We strive to give you quality time with your provider. You may need to reschedule your appointment if you arrive late (15 or more minutes).  Arriving late affects you and other patients whose appointments are after yours.  Also, if you miss three or more appointments without notifying the office, you may be dismissed from the clinic at the provider?s discretion.    ?  ?For prescription refill requests, have your pharmacy contact our office and allow 72 hours for refills to be completed.   ? ?Today you received the following chemotherapy and/or immunotherapy agents: oxaliplatin, leucovorin, fluorourcil    ?  ?To help prevent nausea and vomiting after your treatment, we encourage you to take your nausea medication as directed. ? ?BELOW ARE SYMPTOMS THAT SHOULD BE REPORTED IMMEDIATELY: ?*FEVER GREATER THAN 100.4 F (38 ?C) OR HIGHER ?*CHILLS OR SWEATING ?*NAUSEA AND VOMITING THAT IS NOT CONTROLLED WITH YOUR NAUSEA MEDICATION ?*UNUSUAL SHORTNESS OF BREATH ?*UNUSUAL BRUISING OR BLEEDING ?*URINARY PROBLEMS (pain or burning when urinating, or frequent urination) ?*BOWEL PROBLEMS (unusual diarrhea, constipation, pain near the anus) ?TENDERNESS IN MOUTH AND THROAT WITH OR WITHOUT PRESENCE OF ULCERS (sore throat, sores in mouth, or a toothache) ?UNUSUAL RASH, SWELLING OR PAIN  ?UNUSUAL VAGINAL DISCHARGE OR ITCHING  ? ?Items with * indicate a potential emergency and should be followed up as soon as possible or go to the Emergency Department if any problems should occur. ? ?Please show the CHEMOTHERAPY ALERT CARD or IMMUNOTHERAPY  ALERT CARD at check-in to the Emergency Department and triage nurse. ? ?Should you have questions after your visit or need to cancel or reschedule your appointment, please contact [facility]  Dept: [phone]  and follow the prompts.  Office hours are 8:00 a.m. to 4:30 p.m. Monday - Friday. Please note that voicemails left after 4:00 p.m. may not be returned until the following business day.  We are closed weekends and major holidays. You have access to a nurse at all times for urgent questions. Please call the main number to the clinic Dept: [phone] and follow the prompts. ? ? ?For any non-urgent questions, you may also contact your provider using MyChart. We now offer e-Visits for anyone 29 and older to request care online for non-urgent symptoms. For details visit [email or web address]. ?  ?Also download the MyChart app! Go to the app store, search "MyChart", open the app, select [facility], and log in with your MyChart username and password. ? ?Due to Covid, a mask is required upon entering the hospital/clinic. If you do not have a mask, one will be given to you upon arrival. For doctor visits, patients may have 1 support person aged 31 or older with them. For treatment visits, patients cannot have anyone with them due to current Covid guidelines and our immunocompromised population.  ? ?The chemotherapy medication bag should finish at 46 hours, 96 hours, or 7 days. For example, if your pump is scheduled for 46 hours and it was put on at 4:00 p.m., it should finish at 2:00 p.m. the day it is scheduled to come off regardless  of your appointment time.   ?  ?Estimated time to finish at . ?  ?If the display on your pump reads "Low Volume" and it is beeping, take the batteries out of the pump and come to the cancer center for it to be taken off.  ? ?If the pump alarms go off prior to the pump reading "Low Volume" then call [phone] and someone can assist you. ? ?If the  plunger comes out and the chemotherapy medication is leaking out, please use your home chemo spill kit to clean up the spill. Do NOT use paper towels or other household products. ? ?If you have problems or questions regarding your pump, please call either [phone] (24 hours a day) or the cancer center Monday-Friday 8:00 a.m.- 4:30 p.m. at the clinic number and we will assist you. If you are unable to get assistance, then go to the nearest Emergency Department and ask the staff to contact the IV team for assistance.   ? ?Oxaliplatin Injection ?What is this medication? ?OXALIPLATIN (ox AL i PLA tin) is a chemotherapy drug. It targets fast dividing cells, like cancer cells, and causes these cells to die. This medicine is used to treat cancers of the colon and rectum, and many other cancers. ?This medicine may be used for other purposes; ask your health care provider or pharmacist if you have questions. ?COMMON BRAND NAME(S): Eloxatin ?What should I tell my care team before I take this medication? ?They need to know if you have any of these conditions: ?heart disease ?history of irregular heartbeat ?liver disease ?low blood counts, like white cells, platelets, or red blood cells ?lung or breathing disease, like asthma ?take medicines that treat or prevent blood clots ?tingling of the fingers or toes, or other nerve disorder ?an unusual or allergic reaction to oxaliplatin, other chemotherapy, other medicines, foods, dyes, or preservatives ?pregnant or trying to get pregnant ?breast-feeding ?How should I use this medication? ?This drug is given as an infusion into a vein. It is administered in a hospital or clinic by a specially trained health care professional. ?Talk to your pediatrician regarding the use of this medicine in children. Special care may be needed. ?Overdosage: If you think you have taken too much of this medicine contact a poison control center or emergency room at once. ?NOTE: This medicine is only  for you. Do not share this medicine with others. ?What if I miss a dose? ?It is important not to miss a dose. Call your doctor or health care professional if you are unable to keep an appointment. ?What may interact with this medication? ?Do not take this medicine with any of the following medications: ?cisapride ?dronedarone ?pimozide ?thioridazine ?This medicine may also interact with the following medications: ?aspirin and aspirin-like medicines ?certain medicines that treat or prevent blood clots like warfarin, apixaban, dabigatran, and rivaroxaban ?cisplatin ?cyclosporine ?diuretics ?medicines for infection like acyclovir, adefovir, amphotericin B, bacitracin, cidofovir, foscarnet, ganciclovir, gentamicin, pentamidine, vancomycin ?NSAIDs, medicines for pain and inflammation, like ibuprofen or naproxen ?other medicines that prolong the QT interval (an abnormal heart rhythm) ?pamidronate ?zoledronic acid ?This list may not describe all possible interactions. Give your health care provider a list of all the medicines, herbs, non-prescription drugs, or dietary supplements you use. Also tell them if you smoke, drink alcohol, or use illegal drugs. Some items may interact with your medicine. ?What should I watch for while using this medication? ?Your condition will be monitored carefully while you are receiving this medicine. ?You may  need blood work done while you are taking this medicine. ?This medicine may make you feel generally unwell. This is not uncommon as chemotherapy can affect healthy cells as well as cancer cells. Report any side effects. Continue your course of treatment even though you feel ill unless your healthcare professional tells you to stop. ?This medicine can make you more sensitive to cold. Do not drink cold drinks or use ice. Cover exposed skin before coming in contact with cold temperatures or cold objects. When out in cold weather wear warm clothing and cover your mouth and nose to warm the  air that goes into your lungs. Tell your doctor if you get sensitive to the cold. ?Do not become pregnant while taking this medicine or for 9 months after stopping it. Women should inform their health

## 2022-01-22 ENCOUNTER — Inpatient Hospital Stay: Payer: Medicare PPO | Attending: Physician Assistant

## 2022-01-22 VITALS — BP 136/85 | HR 59 | Temp 98.2°F | Resp 18 | Ht 69.5 in

## 2022-01-22 DIAGNOSIS — C182 Malignant neoplasm of ascending colon: Secondary | ICD-10-CM | POA: Insufficient documentation

## 2022-01-22 DIAGNOSIS — I10 Essential (primary) hypertension: Secondary | ICD-10-CM | POA: Insufficient documentation

## 2022-01-22 DIAGNOSIS — C779 Secondary and unspecified malignant neoplasm of lymph node, unspecified: Secondary | ICD-10-CM | POA: Insufficient documentation

## 2022-01-22 DIAGNOSIS — G629 Polyneuropathy, unspecified: Secondary | ICD-10-CM | POA: Diagnosis not present

## 2022-01-22 DIAGNOSIS — Z5111 Encounter for antineoplastic chemotherapy: Secondary | ICD-10-CM | POA: Insufficient documentation

## 2022-01-22 DIAGNOSIS — D509 Iron deficiency anemia, unspecified: Secondary | ICD-10-CM | POA: Diagnosis not present

## 2022-01-22 DIAGNOSIS — N289 Disorder of kidney and ureter, unspecified: Secondary | ICD-10-CM | POA: Insufficient documentation

## 2022-01-22 MED ORDER — HEPARIN SOD (PORK) LOCK FLUSH 100 UNIT/ML IV SOLN
500.0000 [IU] | Freq: Once | INTRAVENOUS | Status: AC | PRN
  Administered 2022-01-22: 500 [IU]

## 2022-01-22 MED ORDER — SODIUM CHLORIDE 0.9% FLUSH
10.0000 mL | INTRAVENOUS | Status: DC | PRN
  Administered 2022-01-22: 10 mL

## 2022-01-23 IMAGING — PT NM PET TUM IMG INITIAL (PI) SKULL BASE T - THIGH
1 of 7 series · 1 of 25 positions shown · non-contrast
Comparison: CTs of the chest, abdomen and pelvis 10/21/2021

CLINICAL DATA: Initial treatment strategy for metastatic colon
cancer. Indeterminate renal lesion.

EXAM:
NUCLEAR MEDICINE PET SKULL BASE TO THIGH
TECHNIQUE: 10.66 mCi F-18 FDG was injected intravenously. Full-ring PET imaging
was performed from the skull base to thigh after the radiotracer. CT
data was obtained and used for attenuation correction and anatomic
localization.
Fasting blood glucose: 112 mg/dl

[Series 4: ct sk_thigh 5.0 bf37 · axial · 5.0mm · 0.98mm/px · 1 of 232 slices shown]
[im 232/232  brain]
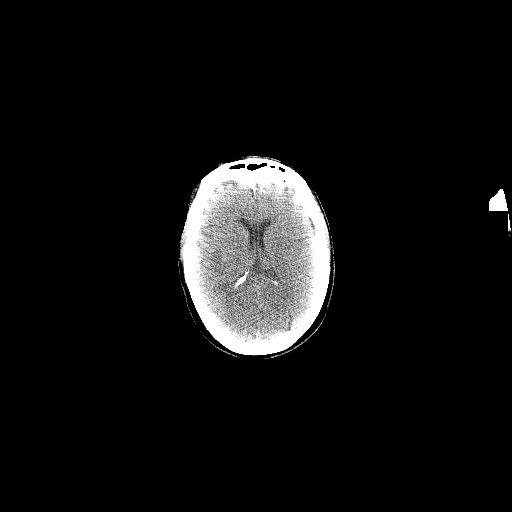

[1 of 25 positions shown; findings below may reference images not displayed]

FINDINGS: Mediastinal blood pool activity: SUV max

NECK:

There are several small mildly hypermetabolic lower cervical lymph
nodes bilaterally demonstrating an SUV max of up to 4.5 on the left.
These lymph nodes are not enlarged.There are no lesions of the
pharyngeal mucosal space. Symmetric activity within the lymphoid
tissue of Waldeyer's ring is within physiologic limits.

Incidental CT findings: Bilateral carotid atherosclerosis.

CHEST:

There are small hypermetabolic left axillary, subpectoral and
supraclavicular lymph nodes. A in 8 mm left supraclavicular node on
image 40/4 has an SUV max of 4.5. There is a 7 mm left axillary node
on image 59/4 with an SUV max of 6.4. These nodes are similar in
size to the recent CT. No other hypermetabolic mediastinal or hilar
lymph nodes identified. No hypermetabolic pulmonary activity or
suspicious nodularity.

Incidental CT findings: Mild aortic and great vessel
atherosclerosis.

ABDOMEN/PELVIS:

There is no hypermetabolic activity within the liver, adrenal
glands, spleen or pancreas. There is focal hypermetabolic activity
within the ascending colon associated with the irregular
circumferential mass demonstrated previously. This has an SUV max of
9.9. Adjacent ileocolonic mesenteric lymph nodes are hypermetabolic,
largest measuring 12 mm on image 123/4 (SUV max 2.8). No other
hypermetabolic lymph nodes in the abdomen or pelvis. Specifically,
the mildly prominent external iliac lymph nodes do not show any
increased metabolic activity. The previously demonstrated hyperdense
renal lesions bilaterally do not demonstrate any metabolic activity
and are likely hemorrhagic/proteinaceous cysts, although are
suboptimally evaluated by PET-CT.

Incidental CT findings: As above, hyperdense renal lesions
bilaterally, unchanged from recent CT. Largest posteriorly in the
lower pole of the left kidney measures 2.2 cm on image 127/4 and has
a nonspecific density of 49 HU. Mild aortic and branch vessel
atherosclerosis.

SKELETON:

There is no hypermetabolic activity to suggest osseous metastatic
disease.

Incidental CT findings: none
IMPRESSION: 1. The known colon cancer involving the ascending colon is
hypermetabolic, as well as the adjacent mildly enlarged ileocolonic
mesenteric lymph nodes, consistent with nodal metastases.
2. No evidence of metastatic disease to the liver.
3. Small lower cervical, left subpectoral and left axillary lymph
nodes are mildly hypermetabolic. These lymph nodes are nonspecific,
and would be unlikely for metastatic colon cancer. [REDACTED] be
reactive. Attention on follow-up recommended.
4. Bilateral hyperdense renal lesions do not show any metabolic
activity, and are favored to reflect hemorrhagic/proteinaceous
cysts. Renal lesions are suboptimally evaluated by PET-CT.
Management options include further evaluation with dedicated renal
MRI (pre and post-contrast imaging) or inclusion of pre contrast
images of the kidneys on CT follow-up within 6 months.

## 2022-02-03 ENCOUNTER — Inpatient Hospital Stay (HOSPITAL_BASED_OUTPATIENT_CLINIC_OR_DEPARTMENT_OTHER): Payer: Medicare PPO | Admitting: Hematology

## 2022-02-03 ENCOUNTER — Other Ambulatory Visit: Payer: Self-pay

## 2022-02-03 ENCOUNTER — Inpatient Hospital Stay: Payer: Medicare PPO

## 2022-02-03 ENCOUNTER — Encounter: Payer: Self-pay | Admitting: Hematology

## 2022-02-03 VITALS — BP 138/86 | HR 55 | Temp 98.1°F | Resp 18 | Ht 69.5 in | Wt 218.6 lb

## 2022-02-03 DIAGNOSIS — C182 Malignant neoplasm of ascending colon: Secondary | ICD-10-CM

## 2022-02-03 DIAGNOSIS — C19 Malignant neoplasm of rectosigmoid junction: Secondary | ICD-10-CM

## 2022-02-03 DIAGNOSIS — Z95828 Presence of other vascular implants and grafts: Secondary | ICD-10-CM

## 2022-02-03 DIAGNOSIS — Z5111 Encounter for antineoplastic chemotherapy: Secondary | ICD-10-CM | POA: Diagnosis not present

## 2022-02-03 LAB — CMP (CANCER CENTER ONLY)
ALT: 25 U/L (ref 0–44)
AST: 26 U/L (ref 15–41)
Albumin: 3.7 g/dL (ref 3.5–5.0)
Alkaline Phosphatase: 77 U/L (ref 38–126)
Anion gap: 4 — ABNORMAL LOW (ref 5–15)
BUN: 13 mg/dL (ref 8–23)
CO2: 33 mmol/L — ABNORMAL HIGH (ref 22–32)
Calcium: 9.2 mg/dL (ref 8.9–10.3)
Chloride: 102 mmol/L (ref 98–111)
Creatinine: 1.21 mg/dL (ref 0.61–1.24)
GFR, Estimated: >60 mL/min (ref >60)
Glucose, Bld: 152 mg/dL — ABNORMAL HIGH (ref 70–99)
Potassium: 3.1 mmol/L — ABNORMAL LOW (ref 3.5–5.1)
Sodium: 139 mmol/L (ref 135–145)
Total Bilirubin: 0.8 mg/dL (ref 0.3–1.2)
Total Protein: 7.1 g/dL (ref 6.5–8.1)

## 2022-02-03 LAB — CBC WITH DIFFERENTIAL (CANCER CENTER ONLY)
Abs Immature Granulocytes: 0.01 10*3/uL (ref 0.00–0.07)
Basophils Absolute: 0.1 10*3/uL (ref 0.0–0.1)
Basophils Relative: 1 %
Eosinophils Absolute: 0.1 10*3/uL (ref 0.0–0.5)
Eosinophils Relative: 3 %
HCT: 37.7 % — ABNORMAL LOW (ref 39.0–52.0)
Hemoglobin: 11.8 g/dL — ABNORMAL LOW (ref 13.0–17.0)
Immature Granulocytes: 0 %
Lymphocytes Relative: 30 %
Lymphs Abs: 1.4 10*3/uL (ref 0.7–4.0)
MCH: 26.4 pg (ref 26.0–34.0)
MCHC: 31.3 g/dL (ref 30.0–36.0)
MCV: 84.3 fL (ref 80.0–100.0)
Monocytes Absolute: 0.8 10*3/uL (ref 0.1–1.0)
Monocytes Relative: 16 %
Neutro Abs: 2.3 10*3/uL (ref 1.7–7.7)
Neutrophils Relative %: 50 %
Platelet Count: 155 10*3/uL (ref 150–400)
RBC: 4.47 MIL/uL (ref 4.22–5.81)
RDW: 15 % (ref 11.5–15.5)
WBC Count: 4.7 10*3/uL (ref 4.0–10.5)
nRBC: 0 % (ref 0.0–0.2)

## 2022-02-03 MED ORDER — OXALIPLATIN CHEMO INJECTION 100 MG/20ML
85.0000 mg/m2 | Freq: Once | INTRAVENOUS | Status: AC
  Administered 2022-02-03: 190 mg via INTRAVENOUS
  Filled 2022-02-03: qty 38

## 2022-02-03 MED ORDER — SODIUM CHLORIDE 0.9 % IV SOLN
2400.0000 mg/m2 | INTRAVENOUS | Status: DC
  Administered 2022-02-03: 5350 mg via INTRAVENOUS
  Filled 2022-02-03: qty 107

## 2022-02-03 MED ORDER — LEUCOVORIN CALCIUM INJECTION 350 MG
400.0000 mg/m2 | Freq: Once | INTRAVENOUS | Status: AC
  Administered 2022-02-03: 892 mg via INTRAVENOUS
  Filled 2022-02-03: qty 44.6

## 2022-02-03 MED ORDER — SODIUM CHLORIDE 0.9% FLUSH
10.0000 mL | Freq: Once | INTRAVENOUS | Status: AC
  Administered 2022-02-03: 10 mL

## 2022-02-03 MED ORDER — SODIUM CHLORIDE 0.9 % IV SOLN
10.0000 mg | Freq: Once | INTRAVENOUS | Status: AC
  Administered 2022-02-03: 10 mg via INTRAVENOUS
  Filled 2022-02-03: qty 10

## 2022-02-03 MED ORDER — PALONOSETRON HCL INJECTION 0.25 MG/5ML
0.2500 mg | Freq: Once | INTRAVENOUS | Status: AC
  Administered 2022-02-03: 0.25 mg via INTRAVENOUS
  Filled 2022-02-03: qty 5

## 2022-02-03 MED ORDER — HEPARIN SOD (PORK) LOCK FLUSH 100 UNIT/ML IV SOLN: 500.0000 [IU] | Freq: Once | INTRAVENOUS | Status: DC | PRN

## 2022-02-03 MED ORDER — DEXTROSE 5 % IV SOLN: Freq: Once | INTRAVENOUS | Status: AC

## 2022-02-03 MED ORDER — SODIUM CHLORIDE 0.9% FLUSH: 10.0000 mL | INTRAVENOUS | Status: DC | PRN

## 2022-02-03 MED ORDER — FLUOROURACIL CHEMO INJECTION 2.5 GM/50ML
400.0000 mg/m2 | Freq: Once | INTRAVENOUS | Status: AC
  Administered 2022-02-03: 900 mg via INTRAVENOUS
  Filled 2022-02-03: qty 18

## 2022-02-03 NOTE — Patient Instructions (Addendum)
[facility]  Discharge Instructions: ?Thank you for choosing [facility] to provide your oncology and hematology care.  ? ?If you have a lab appointment with the [facility], please go directly to the [facility] and check in at the registration area. ?  ?Wear comfortable clothing and clothing appropriate for easy access to any Portacath or PICC line.  ? ?We strive to give you quality time with your provider. You may need to reschedule your appointment if you arrive late (15 or more minutes).  Arriving late affects you and other patients whose appointments are after yours.  Also, if you miss three or more appointments without notifying the office, you may be dismissed from the clinic at the provider?s discretion.    ?  ?For prescription refill requests, have your pharmacy contact our office and allow 72 hours for refills to be completed.   ? ?Today you received the following chemotherapy and/or immunotherapy agents: oxaliplatin, leucovorin, fluroruracil    ?  ?To help prevent nausea and vomiting after your treatment, we encourage you to take your nausea medication as directed. ? ?BELOW ARE SYMPTOMS THAT SHOULD BE REPORTED IMMEDIATELY: ?*FEVER GREATER THAN 100.4 F (38 ?C) OR HIGHER ?*CHILLS OR SWEATING ?*NAUSEA AND VOMITING THAT IS NOT CONTROLLED WITH YOUR NAUSEA MEDICATION ?*UNUSUAL SHORTNESS OF BREATH ?*UNUSUAL BRUISING OR BLEEDING ?*URINARY PROBLEMS (pain or burning when urinating, or frequent urination) ?*BOWEL PROBLEMS (unusual diarrhea, constipation, pain near the anus) ?TENDERNESS IN MOUTH AND THROAT WITH OR WITHOUT PRESENCE OF ULCERS (sore throat, sores in mouth, or a toothache) ?UNUSUAL RASH, SWELLING OR PAIN  ?UNUSUAL VAGINAL DISCHARGE OR ITCHING  ? ?Items with * indicate a potential emergency and should be followed up as soon as possible or go to the Emergency Department if any problems should occur. ? ?Please show the CHEMOTHERAPY ALERT CARD or  IMMUNOTHERAPY ALERT CARD at check-in to the Emergency Department and triage nurse. ? ?Should you have questions after your visit or need to cancel or reschedule your appointment, please contact [facility]  Dept: [phone]  and follow the prompts.  Office hours are 8:00 a.m. to 4:30 p.m. Monday - Friday. Please note that voicemails left after 4:00 p.m. may not be returned until the following business day.  We are closed weekends and major holidays. You have access to a nurse at all times for urgent questions. Please call the main number to the clinic Dept: [phone] and follow the prompts. ? ? ?For any non-urgent questions, you may also contact your provider using MyChart. We now offer e-Visits for anyone 70 and older to request care online for non-urgent symptoms. For details visit [email or web address]. ?  ?Also download the MyChart app! Go to the app store, search "MyChart", open the app, select [facility], and log in with your MyChart username and password. ? ?Due to Covid, a mask is required upon entering the hospital/clinic. If you do not have a mask, one will be given to you upon arrival. For doctor visits, patients may have 1 support Yannick Steuber aged 70 or older with them. For treatment visits, patients cannot have anyone with them due to current Covid guidelines and our immunocompromised population.   ? ?The chemotherapy medication bag should finish at 46 hours, 96 hours, or 7 days. For example, if your pump is scheduled for 46 hours and it was put on at 4:00 p.m., it should finish at 2:00 p.m. the day it is scheduled to come off  regardless of your appointment time.   ?  ?Estimated time to finish at: 1:30 pm ?  ?If the display on your pump reads "Low Volume" and it is beeping, take the batteries out of the pump and come to the cancer center for it to be taken off.  ? ?If the pump alarms go off prior to the pump reading "Low Volume" then call [phone] and someone can  assist you. ? ?If the plunger comes out and the chemotherapy medication is leaking out, please use your home chemo spill kit to clean up the spill. Do NOT use paper towels or other household products. ? ?If you have problems or questions regarding your pump, please call either [phone] (24 hours a day) or the cancer center Monday-Friday 8:00 a.m.- 4:30 p.m. at the clinic number and we will assist you. If you are unable to get assistance, then go to the nearest Emergency Department and ask the staff to contact the IV team for assistance.   ? ?

## 2022-02-03 NOTE — Progress Notes (Signed)
?[place]   ?[phone] [phone]   ?Clinic Follow up Note  ? ?Patient Care Team: ?Arthur Holms, NP as PCP - General (Nurse Practitioner) ?Truitt Merle, MD as Consulting Physician (Oncology) ? ?Date of Service:  02/03/2022 ? ?CHIEF COMPLAINT: f/u of colorectal cancer ? ?CURRENT THERAPY:  ?Adjuvant FOLFOX, to start 12/23/21 ? ?ASSESSMENT & PLAN:  ?DAYNA GEURTS is a 71 y.o. male with  ? ?1. Colorectal cancer, adenocarcinoma of the ascending colon, pT3N2aMx, at least stage IIIB, MSS ?-Colonoscopy performed on 10/13/21 for IDA and abdominal pain showed a 4 cm mass near the hepatic flexure. Pathology confirmed adenocarcinoma, MMR normal. ?-Baseline CEA was elevated at 25.9 on 10/13/21 ?-CT C/A/P on 10/21/21 showed: circumferential mass in the mid ascending colon; mildly prominent, asymmetric right external iliac lymph node, nonspecific but suspicious.   ?-PET scan on 11/09/21 showed: hypermetabolism to colon mass and adjacent mildly enlarged ileocolonic mesenteric lymph nodes. Otherwise, no definitive evidence of additional spread. ?-partial colectomy on 11/17/21 with Dr. Marcello Moores showed 5.6 cm invasive moderately differentiated adenocarcinoma invading into pericolic adipose. Margins negative, but metastatic carcinoma in 6 lymph nodes (6/15). Two additional tubular adenomas without high-grade dysplasia noted. MMR normal and MSI stable.  ?-given his multiple (6) positive lymph nodes, I recommend adjuvant chemotherapy with FOLFOX for 6 months. He began treatment on 12/23/21 and tolerated very well overall with mild side effects. ?-labs reviewed, adequate to proceed with C4 FOLFOX today as scheduled.  ? ?2. Symptom Management: Low appetite, Mild neuropathy ?-he reports he only eats one "good meal" a day. I advised him to supplement with protein drinks like Ensure. ?-he reports some mild numbness/tingling in his fingers. He notes it may have been present prior to chemo but is more noticeable  since starting treatment. We will monitor. ?  ?3. Iron deficiency anemia  ?-Patient currently taking ferrous sulfate BID and compliant. Has had good response to oral iron supplement, has not required additional treatment. ?-hgb overall stable since surgery, up to 11.8 today (02/03/22) ?  ?4. Renal Lesion ?-incidental finding on CT CAP 10/21/21 ?-PET on 11/09/21 showed no metabolic activity, favored to reflect hemorrhagic/proteinaceous cysts. Plan to get renal US after he completes chemo  ?  ?5.  Left axillary adenopathy ?-Seen on initial CT scan, positive on PET in 10/2021. ?-Attempted biopsy, ultrasound showed normal-appearing left axillary adenopathy, biopsy was deferred. ?-will f/u by CT scan in May or June  ?  ?  ?PLAN: ?-proceed with C4 FOLFOX today  ?-lab, flush, f/u, and FOLFOX in 2 and 4 weeks ? ? ?No problem-specific Assessment & Plan notes found for this encounter. ? ? ?SUMMARY OF ONCOLOGIC HISTORY: ?Oncology History Overview Note  ? Cancer Staging  ?Colorectal cancer ([place]) ?Staging form: Colon and Rectum, AJCC 8th Edition ?- Pathologic stage from 11/17/2021: Stage IIIB (pT3, pN2a, cM0) - Signed by Truitt Merle, MD on 12/03/2021 ? ?  ?Cancer of right colon [facility])  ?10/13/2021 Procedure  ? Upper GI endoscopy by Dr. Ardis Hughs for IDA and abdominal pain ? ?Findings:  ?- The esophagus was normal. ?- The stomach was normal. ?- The examined duodenum was normal. ?  ?10/13/2021 Procedure  ? Colonoscopy ?Indications: Iron deficiency ? ?- A fungating and ulcerated partially obstructing, clearly malignant mass was found at the ?hepatic flexure. The mass was circumferential and measured 4cm in length. Biopsies were ?taken with a cold forceps for histology and then the mucosa just distal to the mass was ?tattooed with an injection of Spot (  carbon black). jar 1 ?Findings: ?- Two pedunculated polyps were found in the mid transverse colon. The polyps were 8 to 11 ?mm in size. These polyps were removed with a hot snare. Resection and  retrieval were ?complete. jar 2 ?- Five pedunculated and sessile polyps were found in the distal transverse colon. The polyps ?were 4 to 14 mm in size. Two were removed with hot snare, three were removed with cold ?snare. Resection and retrieval were complete. jar 3 ?- A 20 mm polyp was found in the distal transverse colon. The polyp was pedunculated. The ?polyp was removed with a hot snare. Resection and retrieval were complete. jar 4 ?- The exam was otherwise without abnormality on direct and retroflexion views. ?  ?10/13/2021 Pathology Results  ? REPORT OF SURGICAL PATHOLOGY ?Accession: [id number] ? ?Diagnosis ?1. Hepatic Flexure Biopsy ?- ADENOCARCINOMA, SEE NOTE ?  ?10/21/2021 Imaging  ? CT CHEST ABDOMEN PELVIS W CONTRAST  ?IMPRESSION: ?1. There is a circumferential mass of the mid ascending colon ?measuring approximately 5 cm in length, in keeping with newly ?diagnosed primary colon malignancy. ?2. Adjacent to the colon mass, there is mesenteric fat stranding ?suggesting invasion beyond the muscularis as well as abnormally ?enlarged mesocolonic lymph nodes. ?3. Mildly prominent, asymmetric right external iliac lymph nodes, ?nonspecific although suspicious for early nodal metastatic disease. ?4. Asymmetrically prominent left axillary lymph nodes, nonspecific, ?these nodes less favored to reflect metastatic disease given ?location in the absence of other evidence of metastatic disease ?above the diaphragm. Both external iliac and axillary lymph nodes ?could be characterized for abnormal metabolic activity by PET-CT. ?5. No evidence of organ metastatic disease in the chest, abdomen, or ?pelvis. ?6. Exophytic soft tissue attenuation lesion of the anterior ?midportion of the left kidney measuring 1.2 cm. Partially exophytic ?soft tissue attenuation lesion of the posteroinferior pole of the ?left kidney measuring 1.9 x 1.7 cm. These may reflect hemorrhagic or ?proteinaceous cysts but are incompletely characterized  and ?incidental renal cell carcinoma is not excluded. Recommend initial ?renal ultrasound to establish definitively cystic character, ?multiphasic contrast enhanced CT or MRI may however be further ?required to exclude malignancy. ?7. Prostatomegaly with thickening of the decompressed urinary ?bladder wall, likely due to chronic outlet obstruction. ?  ?Aortic Atherosclerosis (ICD10-I70.0). ?  ?10/28/2021 Initial Diagnosis  ? Colorectal cancer [facility]) ?  ?11/17/2021 Cancer Staging  ? Staging form: Colon and Rectum, AJCC 8th Edition ?- Pathologic stage from 11/17/2021: Stage IIIB (pT3, pN2a, cM0) - Signed by Truitt Merle, MD on 12/03/2021 ?Histologic grading system: 4 grade system ?Histologic grade (G): G2 ?Residual tumor (R): R0 - None ? ?  ?11/17/2021 Definitive Surgery  ? FINAL MICROSCOPIC DIAGNOSIS:  ? ?A. COLON, RIGHT, RESECTION:  ?Invasive moderately differentiated adenocarcinoma invading into  ?pericolic adipose (pT3)  ?Tumor measures 5.2 x 3.6 x 0.8 cm  ?Margins free of carcinoma and adenomatous change  ?Six of fifteen pericolic lymph nodes with metastatic carcinoma (6/15, pN2a)  ?Two additional tubular adenomas without high-grade dysplasia, largest measuring 1.3 cm  ?Benign appendix  ? ?ADDENDUM:  ?Mismatch Repair Protein (IHC)  ?SUMMARY INTERPRETATION: NORMAL ? ?Microsatellite Instability (MSI) ?Result: STABLE ?  ?12/23/2021 -  Chemotherapy  ? Patient is on Treatment Plan : COLORECTAL FOLFOX q14d x 6 months  ?   ? ? ? ?INTERVAL HISTORY:  ?KEYDEN PAVLOV is here for a follow up of colorectal cancer. He was last seen by me on 01/20/22. He presents to the clinic alone. ?He reports he felt "sluggish" for about 3  days after infusion. He reports he is eating and drinking but not as much as he used to. He notes he is eating one "good meal" then snacking the rest of the time. He also reports some numbness/tingling, which he notes some of which was present prior to chemo. He also notes occasional nausea, relieved with medicine. He  denies vomiting. ?  ?All other systems were reviewed with the patient and are negative. ? ?MEDICAL HISTORY:  ?Past Medical History:  ?Diagnosis Date  ? Anemia   ? Arthritis   ? Colon cancer ([place])   ? Cor

## 2022-02-05 ENCOUNTER — Inpatient Hospital Stay: Payer: Medicare PPO

## 2022-02-05 VITALS — BP 150/89 | HR 70 | Temp 98.2°F | Resp 15

## 2022-02-05 DIAGNOSIS — C182 Malignant neoplasm of ascending colon: Secondary | ICD-10-CM

## 2022-02-05 DIAGNOSIS — Z5111 Encounter for antineoplastic chemotherapy: Secondary | ICD-10-CM | POA: Diagnosis not present

## 2022-02-05 MED ORDER — SODIUM CHLORIDE 0.9% FLUSH
10.0000 mL | INTRAVENOUS | Status: DC | PRN
  Administered 2022-02-05: 10 mL

## 2022-02-05 MED ORDER — HEPARIN SOD (PORK) LOCK FLUSH 100 UNIT/ML IV SOLN
500.0000 [IU] | Freq: Once | INTRAVENOUS | Status: AC | PRN
  Administered 2022-02-05: 500 [IU]

## 2022-02-16 ENCOUNTER — Other Ambulatory Visit: Payer: Self-pay | Admitting: Hematology

## 2022-02-16 DIAGNOSIS — C182 Malignant neoplasm of ascending colon: Secondary | ICD-10-CM

## 2022-02-17 ENCOUNTER — Inpatient Hospital Stay: Payer: Medicare PPO

## 2022-02-17 ENCOUNTER — Other Ambulatory Visit: Payer: Self-pay | Admitting: Hematology

## 2022-02-17 ENCOUNTER — Inpatient Hospital Stay (HOSPITAL_BASED_OUTPATIENT_CLINIC_OR_DEPARTMENT_OTHER): Payer: Medicare PPO | Admitting: Hematology

## 2022-02-17 ENCOUNTER — Encounter: Payer: Self-pay | Admitting: Hematology

## 2022-02-17 ENCOUNTER — Other Ambulatory Visit: Payer: Self-pay

## 2022-02-17 VITALS — BP 163/96 | HR 63 | Temp 97.6°F | Resp 16 | Wt 215.2 lb

## 2022-02-17 DIAGNOSIS — C19 Malignant neoplasm of rectosigmoid junction: Secondary | ICD-10-CM

## 2022-02-17 DIAGNOSIS — Z5111 Encounter for antineoplastic chemotherapy: Secondary | ICD-10-CM | POA: Diagnosis not present

## 2022-02-17 DIAGNOSIS — C182 Malignant neoplasm of ascending colon: Secondary | ICD-10-CM

## 2022-02-17 DIAGNOSIS — Z95828 Presence of other vascular implants and grafts: Secondary | ICD-10-CM

## 2022-02-17 LAB — CMP (CANCER CENTER ONLY)
ALT: 37 U/L (ref 0–44)
AST: 35 U/L (ref 15–41)
Albumin: 3.7 g/dL (ref 3.5–5.0)
Alkaline Phosphatase: 81 U/L (ref 38–126)
Anion gap: 6 (ref 5–15)
BUN: 15 mg/dL (ref 8–23)
CO2: 32 mmol/L (ref 22–32)
Calcium: 9.4 mg/dL (ref 8.9–10.3)
Chloride: 103 mmol/L (ref 98–111)
Creatinine: 1.25 mg/dL — ABNORMAL HIGH (ref 0.61–1.24)
GFR, Estimated: >60 mL/min (ref >60)
Glucose, Bld: 178 mg/dL — ABNORMAL HIGH (ref 70–99)
Potassium: 3.1 mmol/L — ABNORMAL LOW (ref 3.5–5.1)
Sodium: 141 mmol/L (ref 135–145)
Total Bilirubin: 1 mg/dL (ref 0.3–1.2)
Total Protein: 7.2 g/dL (ref 6.5–8.1)

## 2022-02-17 LAB — CBC WITH DIFFERENTIAL (CANCER CENTER ONLY)
Abs Immature Granulocytes: 0 10*3/uL (ref 0.00–0.07)
Basophils Absolute: 0.1 10*3/uL (ref 0.0–0.1)
Basophils Relative: 1 %
Eosinophils Absolute: 0.1 10*3/uL (ref 0.0–0.5)
Eosinophils Relative: 2 %
HCT: 38.1 % — ABNORMAL LOW (ref 39.0–52.0)
Hemoglobin: 12 g/dL — ABNORMAL LOW (ref 13.0–17.0)
Immature Granulocytes: 0 %
Lymphocytes Relative: 29 %
Lymphs Abs: 1.3 10*3/uL (ref 0.7–4.0)
MCH: 27 pg (ref 26.0–34.0)
MCHC: 31.5 g/dL (ref 30.0–36.0)
MCV: 85.6 fL (ref 80.0–100.0)
Monocytes Absolute: 0.6 10*3/uL (ref 0.1–1.0)
Monocytes Relative: 13 %
Neutro Abs: 2.5 10*3/uL (ref 1.7–7.7)
Neutrophils Relative %: 55 %
Platelet Count: 121 10*3/uL — ABNORMAL LOW (ref 150–400)
RBC: 4.45 MIL/uL (ref 4.22–5.81)
RDW: 16.1 % — ABNORMAL HIGH (ref 11.5–15.5)
WBC Count: 4.5 10*3/uL (ref 4.0–10.5)
nRBC: 0 % (ref 0.0–0.2)

## 2022-02-17 LAB — CEA (IN HOUSE-CHCC): CEA (CHCC-In House): 1.09 ng/mL (ref 0.00–5.00)

## 2022-02-17 MED ORDER — DEXTROSE 5 % IV SOLN: Freq: Once | INTRAVENOUS | Status: AC

## 2022-02-17 MED ORDER — PALONOSETRON HCL INJECTION 0.25 MG/5ML
0.2500 mg | Freq: Once | INTRAVENOUS | Status: AC
  Administered 2022-02-17: 0.25 mg via INTRAVENOUS
  Filled 2022-02-17: qty 5

## 2022-02-17 MED ORDER — POTASSIUM CHLORIDE CRYS ER 10 MEQ PO TBCR: 10.0000 meq | EXTENDED_RELEASE_TABLET | Freq: Two times a day (BID) | ORAL | 0 refills | Status: DC

## 2022-02-17 MED ORDER — FLUOROURACIL CHEMO INJECTION 2.5 GM/50ML
400.0000 mg/m2 | Freq: Once | INTRAVENOUS | Status: AC
  Administered 2022-02-17: 900 mg via INTRAVENOUS
  Filled 2022-02-17: qty 18

## 2022-02-17 MED ORDER — LEUCOVORIN CALCIUM INJECTION 350 MG
400.0000 mg/m2 | Freq: Once | INTRAVENOUS | Status: AC
  Administered 2022-02-17: 892 mg via INTRAVENOUS
  Filled 2022-02-17: qty 44.6

## 2022-02-17 MED ORDER — OXALIPLATIN CHEMO INJECTION 100 MG/20ML
85.0000 mg/m2 | Freq: Once | INTRAVENOUS | Status: AC
  Administered 2022-02-17: 190 mg via INTRAVENOUS
  Filled 2022-02-17: qty 38

## 2022-02-17 MED ORDER — SODIUM CHLORIDE 0.9 % IV SOLN
2400.0000 mg/m2 | INTRAVENOUS | Status: DC
  Administered 2022-02-17: 5350 mg via INTRAVENOUS
  Filled 2022-02-17: qty 107

## 2022-02-17 MED ORDER — SODIUM CHLORIDE 0.9 % IV SOLN
10.0000 mg | Freq: Once | INTRAVENOUS | Status: AC
  Administered 2022-02-17: 10 mg via INTRAVENOUS
  Filled 2022-02-17: qty 10

## 2022-02-17 MED ORDER — SODIUM CHLORIDE 0.9% FLUSH: 10.0000 mL | INTRAVENOUS | Status: DC | PRN

## 2022-02-17 MED ORDER — SODIUM CHLORIDE 0.9% FLUSH
10.0000 mL | Freq: Once | INTRAVENOUS | Status: AC
  Administered 2022-02-17: 10 mL

## 2022-02-17 NOTE — Progress Notes (Signed)
?[place]   ?Telephone:(336) [phone] [phone]   ?Clinic Follow up Note  ? ?Patient Care Team: ?Arthur Holms, NP as PCP - General (Nurse Practitioner) ?Truitt Merle, MD as Consulting Physician (Oncology) ? ?Date of Service:  02/17/2022 ? ?CHIEF COMPLAINT: f/u of colorectal cancer ? ?CURRENT THERAPY:  ?Adjuvant FOLFOX, starting 12/23/21 ? ?ASSESSMENT & PLAN:  ?George Watkins is a 71 y.o. male with  ? ?1. Colorectal cancer, adenocarcinoma of the ascending colon, pT3N2aMx, at least stage IIIB, MSS ?-Colonoscopy performed on 10/13/21 for IDA and abdominal pain showed a 4 cm mass near the hepatic flexure. Pathology confirmed adenocarcinoma, MMR normal. ?-Baseline CEA was elevated at 25.9 on 10/13/21 ?-CT C/A/P on 10/21/21 showed: circumferential mass in the mid ascending colon; mildly prominent, asymmetric right external iliac lymph node, nonspecific but suspicious.   ?-PET scan on 11/09/21 showed: hypermetabolism to colon mass and adjacent mildly enlarged ileocolonic mesenteric lymph nodes. Otherwise, no definitive evidence of additional spread. ?-partial colectomy on 11/17/21 with Dr. Marcello Moores showed 5.6 cm invasive moderately differentiated adenocarcinoma invading into pericolic adipose. Margins negative, but metastatic carcinoma in 6 lymph nodes (6/15). Two additional tubular adenomas without high-grade dysplasia noted. MMR normal and MSI stable.  ?-given his multiple (6) positive lymph nodes, I recommend adjuvant chemotherapy with FOLFOX for 6 months. He began treatment on 12/23/21 and tolerated very well overall with mild side effects.  ?-labs reviewed, adequate to proceed with C5 FOLFOX today as scheduled.  ?-he tells me he has plans for Mother's Day weekend, so we will postpone his treatment by a week. ?  ?2. Symptom Management: Low appetite, Mild neuropathy ?-he reports he only eats one "good meal" a day. I again encouraged him to supplement with protein drinks like Ensure. ?-he reports some mild  numbness/tingling in his fingers. He has only mild sensation decrease on tuning fork exam today (02/17/22). We will monitor and adjust oxali dose as needed. ?  ?3. Iron deficiency anemia  ?-Patient currently taking ferrous sulfate BID and compliant. Has had good response to oral iron supplement, has not required additional treatment. ?-hgb overall stable since surgery, up to 12 today (02/17/22) ?  ?4. Renal Lesion ?-incidental finding on CT CAP 10/21/21 ?-PET on 11/09/21 showed no metabolic activity, favored to reflect hemorrhagic/proteinaceous cysts. Plan to get renal US after he completes chemo  ?  ?5.  Left axillary adenopathy ?-Seen on initial CT scan, positive on PET in 10/2021. ?-Attempted biopsy, ultrasound showed normal-appearing left axillary adenopathy, biopsy was deferred. ?-will f/u by CT scan in May or June  ?  ?  ?PLAN: ?-proceed with C5 FOLFOX today  ?-lab, flush, f/u, and FOLFOX in 3 weeks (postpone for a week due to Mother's day)  ? ? ?No problem-specific Assessment & Plan notes found for this encounter. ? ? ?SUMMARY OF ONCOLOGIC HISTORY: ?Oncology History Overview Note  ? Cancer Staging  ?Colorectal cancer ([place]) ?Staging form: Colon and Rectum, AJCC 8th Edition ?- Pathologic stage from 11/17/2021: Stage IIIB (pT3, pN2a, cM0) - Signed by Truitt Merle, MD on 12/03/2021 ? ?  ?Cancer of right colon [facility])  ?10/13/2021 Procedure  ? Upper GI endoscopy by Dr. Ardis Hughs for IDA and abdominal pain ? ?Findings:  ?- The esophagus was normal. ?- The stomach was normal. ?- The examined duodenum was normal. ?  ?10/13/2021 Procedure  ? Colonoscopy ?Indications: Iron deficiency ? ?- A fungating and ulcerated partially obstructing, clearly malignant mass was found at the ?hepatic flexure. The mass was circumferential and  measured 4cm in length. Biopsies were ?taken with a cold forceps for histology and then the mucosa just distal to the mass was ?tattooed with an injection of Spot (carbon black). jar 1 ?Findings: ?- Two  pedunculated polyps were found in the mid transverse colon. The polyps were 8 to 11 ?mm in size. These polyps were removed with a hot snare. Resection and retrieval were ?complete. jar 2 ?- Five pedunculated and sessile polyps were found in the distal transverse colon. The polyps ?were 4 to 14 mm in size. Two were removed with hot snare, three were removed with cold ?snare. Resection and retrieval were complete. jar 3 ?- A 20 mm polyp was found in the distal transverse colon. The polyp was pedunculated. The ?polyp was removed with a hot snare. Resection and retrieval were complete. jar 4 ?- The exam was otherwise without abnormality on direct and retroflexion views. ?  ?10/13/2021 Pathology Results  ? REPORT OF SURGICAL PATHOLOGY ?Accession: [id number] ? ?Diagnosis ?1. Hepatic Flexure Biopsy ?- ADENOCARCINOMA, SEE NOTE ?  ?10/21/2021 Imaging  ? CT CHEST ABDOMEN PELVIS W CONTRAST  ?IMPRESSION: ?1. There is a circumferential mass of the mid ascending colon ?measuring approximately 5 cm in length, in keeping with newly ?diagnosed primary colon malignancy. ?2. Adjacent to the colon mass, there is mesenteric fat stranding ?suggesting invasion beyond the muscularis as well as abnormally ?enlarged mesocolonic lymph nodes. ?3. Mildly prominent, asymmetric right external iliac lymph nodes, ?nonspecific although suspicious for early nodal metastatic disease. ?4. Asymmetrically prominent left axillary lymph nodes, nonspecific, ?these nodes less favored to reflect metastatic disease given ?location in the absence of other evidence of metastatic disease ?above the diaphragm. Both external iliac and axillary lymph nodes ?could be characterized for abnormal metabolic activity by PET-CT. ?5. No evidence of organ metastatic disease in the chest, abdomen, or ?pelvis. ?6. Exophytic soft tissue attenuation lesion of the anterior ?midportion of the left kidney measuring 1.2 cm. Partially exophytic ?soft tissue attenuation lesion of the  posteroinferior pole of the ?left kidney measuring 1.9 x 1.7 cm. These may reflect hemorrhagic or ?proteinaceous cysts but are incompletely characterized and ?incidental renal cell carcinoma is not excluded. Recommend initial ?renal ultrasound to establish definitively cystic character, ?multiphasic contrast enhanced CT or MRI may however be further ?required to exclude malignancy. ?7. Prostatomegaly with thickening of the decompressed urinary ?bladder wall, likely due to chronic outlet obstruction. ?  ?Aortic Atherosclerosis (ICD10-I70.0). ?  ?10/28/2021 Initial Diagnosis  ? Colorectal cancer [facility]) ?  ?11/17/2021 Cancer Staging  ? Staging form: Colon and Rectum, AJCC 8th Edition ?- Pathologic stage from 11/17/2021: Stage IIIB (pT3, pN2a, cM0) - Signed by Truitt Merle, MD on 12/03/2021 ?Histologic grading system: 4 grade system ?Histologic grade (G): G2 ?Residual tumor (R): R0 - None ? ?  ?11/17/2021 Definitive Surgery  ? FINAL MICROSCOPIC DIAGNOSIS:  ? ?A. COLON, RIGHT, RESECTION:  ?Invasive moderately differentiated adenocarcinoma invading into  ?pericolic adipose (pT3)  ?Tumor measures 5.2 x 3.6 x 0.8 cm  ?Margins free of carcinoma and adenomatous change  ?Six of fifteen pericolic lymph nodes with metastatic carcinoma (6/15, pN2a)  ?Two additional tubular adenomas without high-grade dysplasia, largest measuring 1.3 cm  ?Benign appendix  ? ?ADDENDUM:  ?Mismatch Repair Protein (IHC)  ?SUMMARY INTERPRETATION: NORMAL ? ?Microsatellite Instability (MSI) ?Result: STABLE ?  ?12/23/2021 -  Chemotherapy  ? Patient is on Treatment Plan : COLORECTAL FOLFOX q14d x 6 months  ? ?  ?  ? ? ? ?INTERVAL HISTORY:  ?Cinda Quest  is here for a follow up of colorectal cancer. He was last seen by me on 02/03/22. He presents to the clinic alone. ?He reports he has one instance of vomiting a few days after infusion. He also reports he will be "dragging" for about 7-10 days after. He also reports taste change, causing him to eat less. He adds he is  not currently supplementing with nutritional drinks but plans to buy some soon. ?He tells me he works 3 days a week, M-W. ?  ?All other systems were reviewed with the patient and are negative. ? ?MEDICAL HI

## 2022-02-17 NOTE — Patient Instructions (Signed)
[facility]ICAL [facility]COLOG[facility]  Discharge Instructions: ?Thank you for choosing [facility] to provide your oncology and hematology care.  ? ?If you have a lab appointment with the Cancer Center, please go directly to the Cancer Center and check in at the registration area. ?  ?Wear comfortable clothing and clothing appropriate for easy access to any Portacath or PICC line.  ? ?We strive to give you quality time with your provider. You may need to reschedule your appointment if you arrive late (15 or more minutes).  Arriving late affects you and other patients whose appointments are after yours.  Also, if you miss three or more appointments without notifying the office, you may be dismissed from the clinic at the provider?s discretion.    ?  ?For prescription refill requests, have your pharmacy contact our office and allow 72 hours for refills to be completed.   ? ?Today you received the following chemotherapy and/or immunotherapy agents Oxaliplatin, Leucovorin, 5FU    ?  ?To help prevent nausea and vomiting after your treatment, we encourage you to take your nausea medication as directed. ? ?BELOW ARE SYMPTOMS THAT SHOULD BE REPORTED IMMEDIATELY: ?*FEVER GREATER THAN 100.4 F (38 ?C) OR HIGHER ?*CHILLS OR SWEATING ?*NAUSEA AND VOMITING THAT IS NOT CONTROLLED WITH YOUR NAUSEA MEDICATION ?*UNUSUAL SHORTNESS OF BREATH ?*UNUSUAL BRUISING OR BLEEDING ?*URINARY PROBLEMS (pain or burning when urinating, or frequent urination) ?*BOWEL PROBLEMS (unusual diarrhea, constipation, pain near the anus) ?TENDERNESS IN MOUTH AND THROAT WITH OR WITHOUT PRESENCE OF ULCERS (sore throat, sores in mouth, or a toothache) ?UNUSUAL RASH, SWELLING OR PAIN  ?UNUSUAL VAGINAL DISCHARGE OR ITCHING  ? ?Items with * indicate a potential emergency and should be followed up as soon as possible or go to the Emergency Department if any problems should occur. ? ?Please show the CHEMOTHERAPY ALERT CARD or IMMUNOTHERAPY ALERT  CARD at check-in to the Emergency Department and triage nurse. ? ?Should you have questions after your visit or need to cancel or reschedule your appointment, please contact [facility]  Dept: [phone]  and follow the prompts.  Office hours are 8:00 a.m. to 4:30 p.m. Monday - Friday. Please note that voicemails left after 4:00 p.m. may not be returned until the following business day.  We are closed weekends and major holidays. You have access to a nurse at all times for urgent questions. Please call the main number to the clinic Dept: [phone] and follow the prompts. ? ? ?For any non-urgent questions, you may also contact your provider using MyChart. We now offer e-Visits for anyone 18 and older to request care online for non-urgent symptoms. For details visit [email or web address]. ?  ?Also download the MyChart app! Go to the app store, search "MyChart", open the app, select [facility], and log in with your MyChart username and password. ? ?Due to Covid, a mask is required upon entering the hospital/clinic. If you do not have a mask, one will be given to you upon arrival. For doctor visits, patients may have 1 support person aged 18 or older with them. For treatment visits, patients cannot have anyone with them due to current Covid guidelines and our immunocompromised population.  ? ?

## 2022-02-19 ENCOUNTER — Inpatient Hospital Stay: Payer: Medicare PPO

## 2022-02-19 VITALS — BP 117/70 | HR 61 | Temp 97.7°F | Resp 17 | Ht 69.5 in

## 2022-02-19 DIAGNOSIS — C182 Malignant neoplasm of ascending colon: Secondary | ICD-10-CM

## 2022-02-19 DIAGNOSIS — Z5111 Encounter for antineoplastic chemotherapy: Secondary | ICD-10-CM | POA: Diagnosis not present

## 2022-02-19 MED ORDER — SODIUM CHLORIDE 0.9% FLUSH
10.0000 mL | INTRAVENOUS | Status: DC | PRN
  Administered 2022-02-19: 10 mL

## 2022-02-19 MED ORDER — HEPARIN SOD (PORK) LOCK FLUSH 100 UNIT/ML IV SOLN
500.0000 [IU] | Freq: Once | INTRAVENOUS | Status: AC | PRN
  Administered 2022-02-19: 500 [IU]

## 2022-02-19 NOTE — Patient Instructions (Signed)

## 2022-02-20 IMAGING — US US AXILLARY LEFT
1 series · 7 of 7 positions shown · non-contrast
Comparison: PET-CT 11/09/2021

CLINICAL DATA: Colon carcinoma. Mildly hypermetabolic cervical,
subpectoral, and axillary lymph nodes.

EXAM:
ULTRASOUND OF THE LEFT AXILLA

[Series 1: us axillary node biopsy left · 7 of 7 slices shown]
[im 1/7]
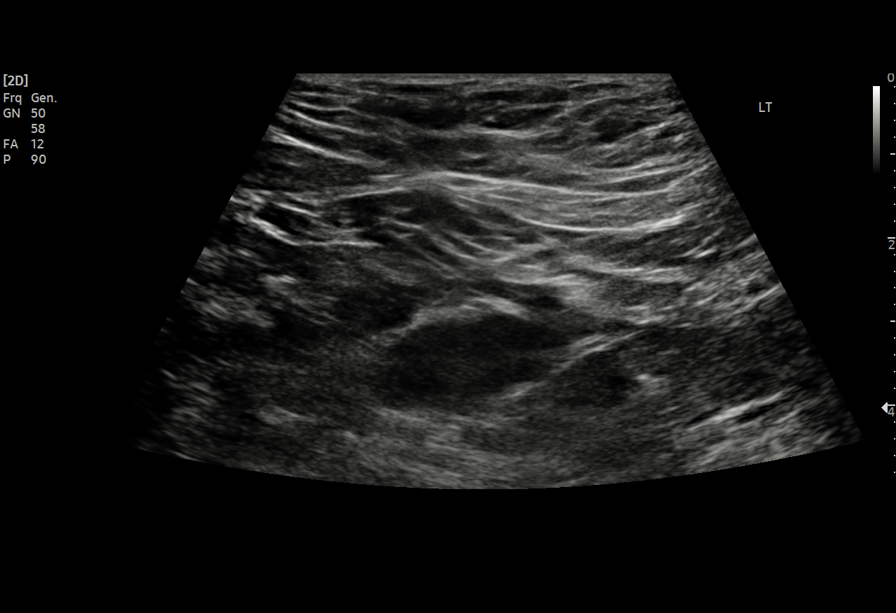
[im 2/7]
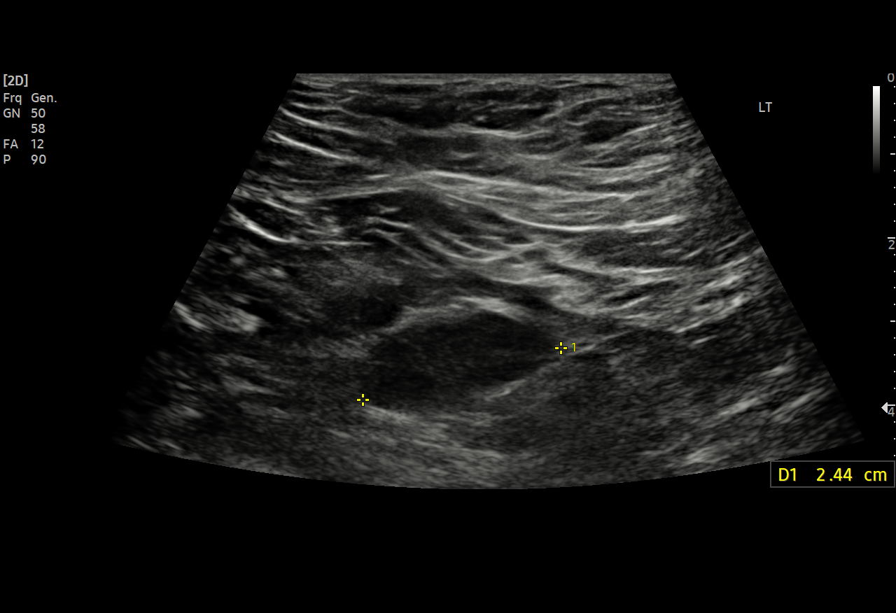
[im 3/7]
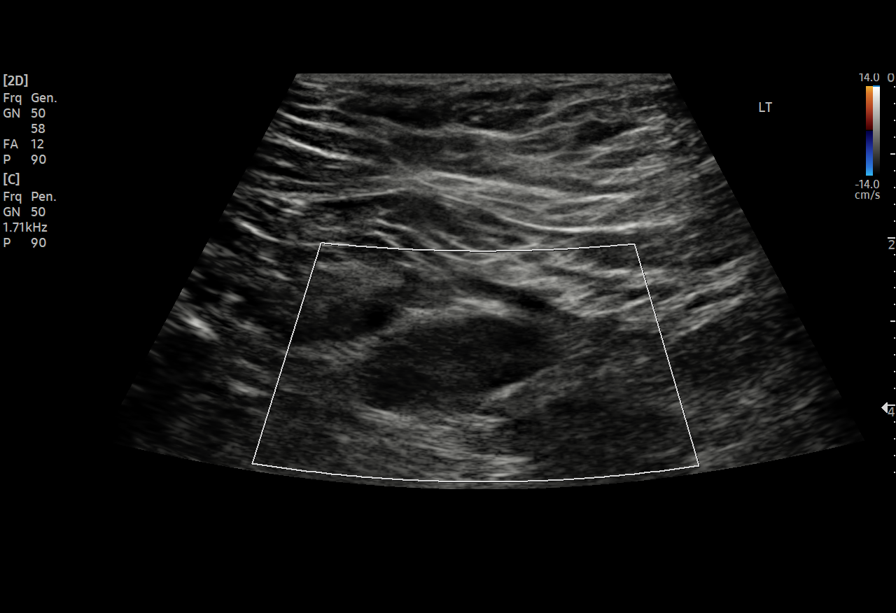
[im 4/7]
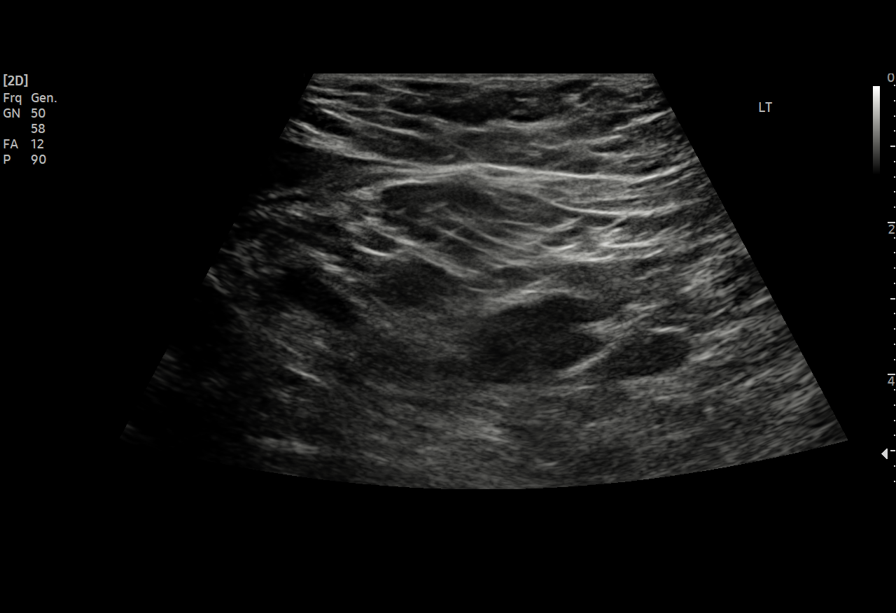
[im 5/7]
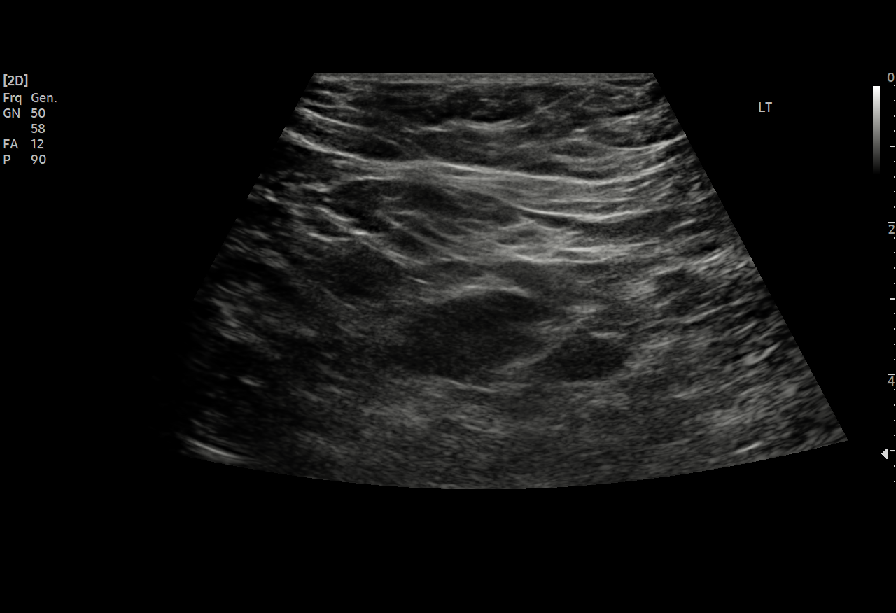
[im 6/7]
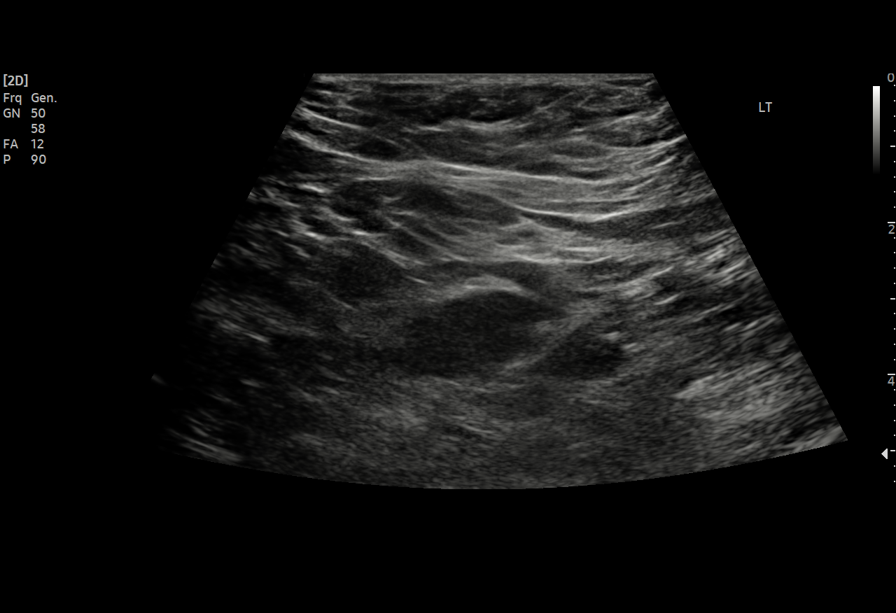
[im 7/7]
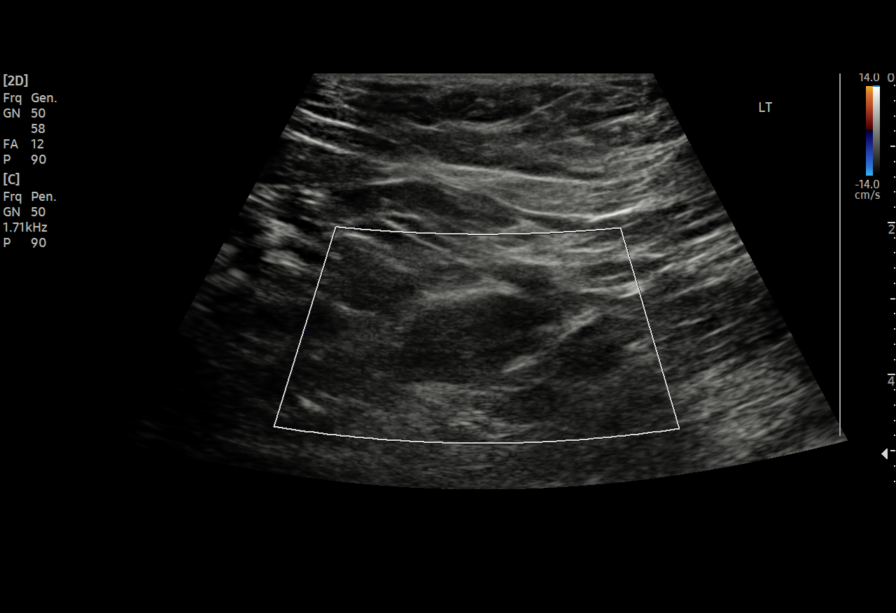

[7 of 7 positions shown; findings below may reference images not displayed]

FINDINGS: On physical exam,no palpable lesion.

Ultrasound is performed, showing a few subcentimeter unremarkable
left axillary lymph nodes. No pathologic adenopathy identified to
provide target for core biopsy.
IMPRESSION: Unremarkable left axillary lymph nodes.  Biopsy deferred.

## 2022-02-22 IMAGING — DX DG CHEST 1V PORT
1 series · 1 of 1 positions shown · non-contrast
Comparison: December 11, 2018.

CLINICAL DATA: Status post Port-A-Cath placement.

EXAM:
PORTABLE CHEST 1 VIEW

[chest ap]
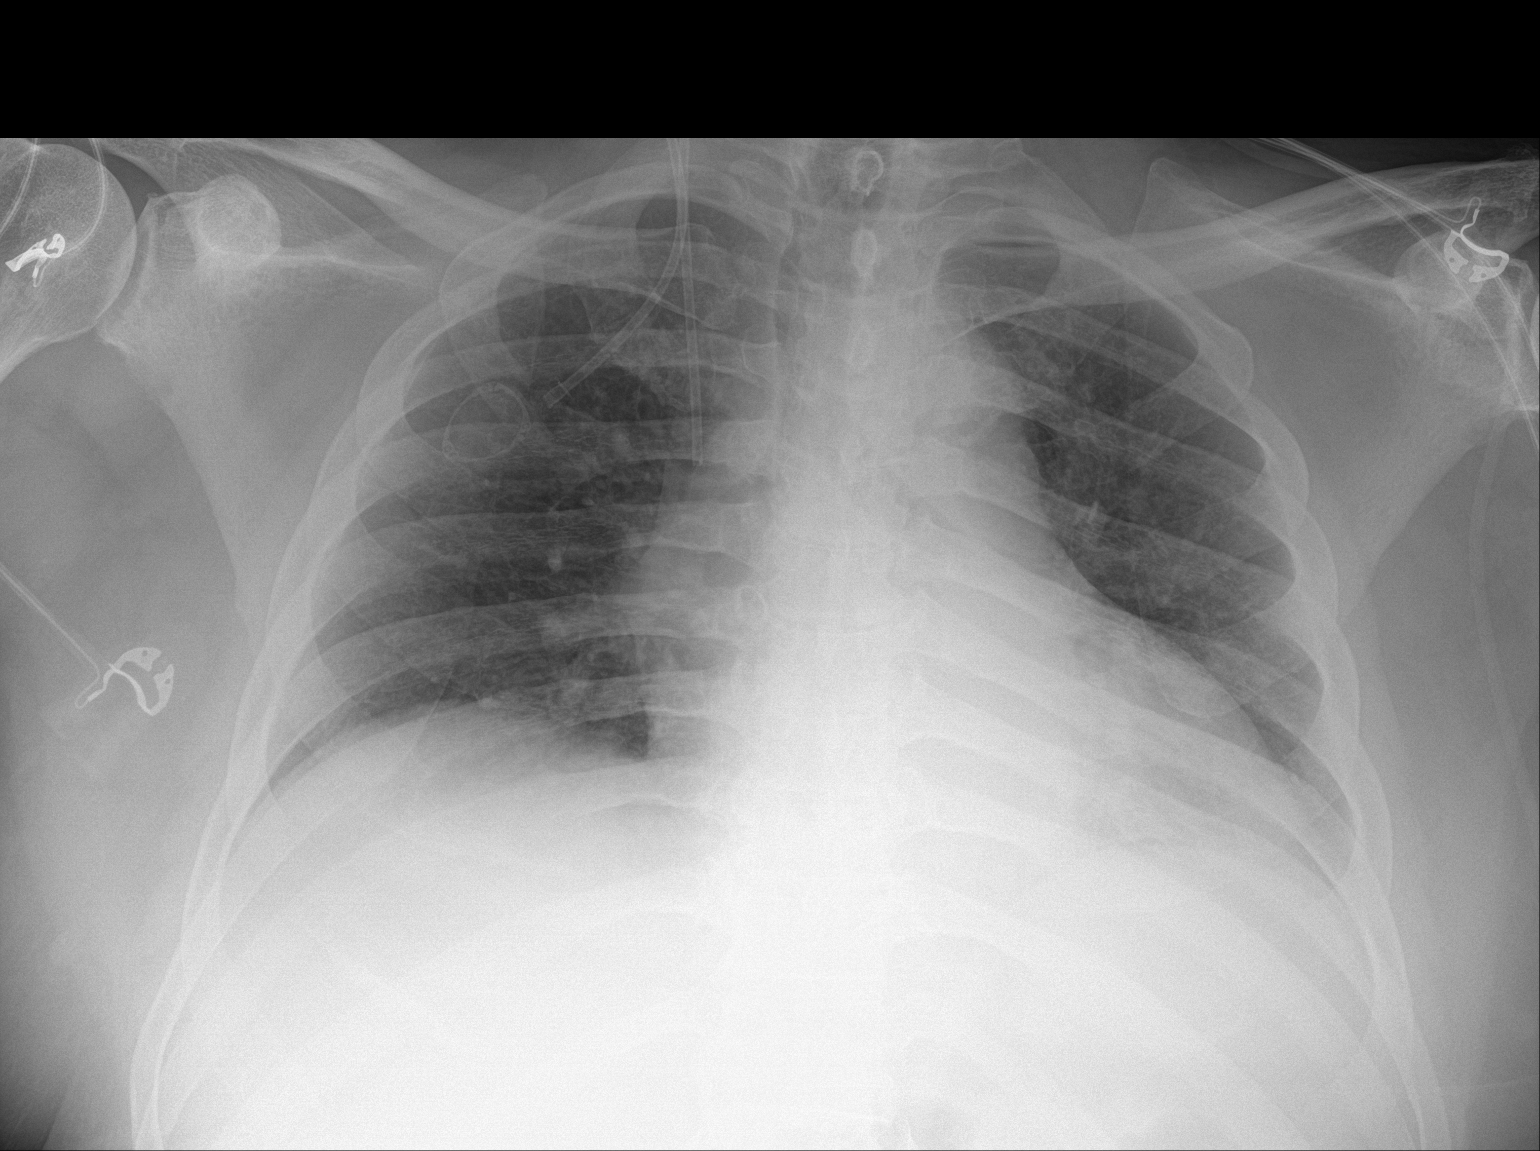

[1 of 1 positions shown; findings below may reference images not displayed]

FINDINGS: Stable cardiomediastinal silhouette. Interval placement of right
internal jugular Port-A-Cath with distal tip in expected position of
the SVC. No pneumothorax or pleural effusion is noted. Both lungs
are clear. The visualized skeletal structures are unremarkable.
IMPRESSION: Interval placement of right internal jugular Port-A-Cath with distal
tip in expected position of the SVC.

## 2022-02-24 ENCOUNTER — Telehealth: Payer: Self-pay | Admitting: Hematology

## 2022-02-24 NOTE — Telephone Encounter (Signed)
Scheduled follow-up appointments per 4/27 los. Patient is aware. 

## 2022-03-03 ENCOUNTER — Other Ambulatory Visit: Payer: Medicare PPO

## 2022-03-03 ENCOUNTER — Ambulatory Visit: Payer: Medicare PPO | Admitting: Hematology

## 2022-03-03 ENCOUNTER — Ambulatory Visit: Payer: Medicare PPO

## 2022-03-09 MED FILL — Dexamethasone Sodium Phosphate Inj 100 MG/10ML: INTRAMUSCULAR | Qty: 1 | Status: AC

## 2022-03-10 ENCOUNTER — Other Ambulatory Visit: Payer: Self-pay

## 2022-03-10 ENCOUNTER — Inpatient Hospital Stay (HOSPITAL_BASED_OUTPATIENT_CLINIC_OR_DEPARTMENT_OTHER): Payer: Medicare PPO | Admitting: Hematology

## 2022-03-10 ENCOUNTER — Inpatient Hospital Stay: Payer: Medicare PPO

## 2022-03-10 ENCOUNTER — Inpatient Hospital Stay: Payer: Medicare PPO | Attending: Physician Assistant

## 2022-03-10 ENCOUNTER — Encounter: Payer: Self-pay | Admitting: Hematology

## 2022-03-10 VITALS — BP 145/80 | HR 58 | Temp 98.1°F | Resp 18 | Ht 69.5 in | Wt 215.8 lb

## 2022-03-10 DIAGNOSIS — Z5111 Encounter for antineoplastic chemotherapy: Secondary | ICD-10-CM | POA: Insufficient documentation

## 2022-03-10 DIAGNOSIS — E876 Hypokalemia: Secondary | ICD-10-CM | POA: Insufficient documentation

## 2022-03-10 DIAGNOSIS — C779 Secondary and unspecified malignant neoplasm of lymph node, unspecified: Secondary | ICD-10-CM | POA: Diagnosis not present

## 2022-03-10 DIAGNOSIS — N2889 Other specified disorders of kidney and ureter: Secondary | ICD-10-CM | POA: Insufficient documentation

## 2022-03-10 DIAGNOSIS — D509 Iron deficiency anemia, unspecified: Secondary | ICD-10-CM | POA: Insufficient documentation

## 2022-03-10 DIAGNOSIS — C182 Malignant neoplasm of ascending colon: Secondary | ICD-10-CM | POA: Diagnosis present

## 2022-03-10 DIAGNOSIS — C19 Malignant neoplasm of rectosigmoid junction: Secondary | ICD-10-CM | POA: Insufficient documentation

## 2022-03-10 DIAGNOSIS — I10 Essential (primary) hypertension: Secondary | ICD-10-CM | POA: Diagnosis not present

## 2022-03-10 DIAGNOSIS — Z95828 Presence of other vascular implants and grafts: Secondary | ICD-10-CM

## 2022-03-10 LAB — CBC WITH DIFFERENTIAL (CANCER CENTER ONLY)
Abs Immature Granulocytes: 0.03 10*3/uL (ref 0.00–0.07)
Basophils Absolute: 0.1 10*3/uL (ref 0.0–0.1)
Basophils Relative: 2 %
Eosinophils Absolute: 0.1 10*3/uL (ref 0.0–0.5)
Eosinophils Relative: 2 %
HCT: 35.7 % — ABNORMAL LOW (ref 39.0–52.0)
Hemoglobin: 11.3 g/dL — ABNORMAL LOW (ref 13.0–17.0)
Immature Granulocytes: 1 %
Lymphocytes Relative: 31 %
Lymphs Abs: 1.5 10*3/uL (ref 0.7–4.0)
MCH: 27.8 pg (ref 26.0–34.0)
MCHC: 31.7 g/dL (ref 30.0–36.0)
MCV: 87.7 fL (ref 80.0–100.0)
Monocytes Absolute: 1 10*3/uL (ref 0.1–1.0)
Monocytes Relative: 22 %
Neutro Abs: 2 10*3/uL (ref 1.7–7.7)
Neutrophils Relative %: 42 %
Platelet Count: 192 10*3/uL (ref 150–400)
RBC: 4.07 MIL/uL — ABNORMAL LOW (ref 4.22–5.81)
RDW: 16.1 % — ABNORMAL HIGH (ref 11.5–15.5)
WBC Count: 4.6 10*3/uL (ref 4.0–10.5)
nRBC: 0 % (ref 0.0–0.2)

## 2022-03-10 LAB — CMP (CANCER CENTER ONLY)
ALT: 17 U/L (ref 0–44)
AST: 21 U/L (ref 15–41)
Albumin: 3.5 g/dL (ref 3.5–5.0)
Alkaline Phosphatase: 83 U/L (ref 38–126)
Anion gap: 4 — ABNORMAL LOW (ref 5–15)
BUN: 17 mg/dL (ref 8–23)
CO2: 30 mmol/L (ref 22–32)
Calcium: 9.5 mg/dL (ref 8.9–10.3)
Chloride: 103 mmol/L (ref 98–111)
Creatinine: 1.2 mg/dL (ref 0.61–1.24)
GFR, Estimated: >60 mL/min (ref >60)
Glucose, Bld: 173 mg/dL — ABNORMAL HIGH (ref 70–99)
Potassium: 3.4 mmol/L — ABNORMAL LOW (ref 3.5–5.1)
Sodium: 137 mmol/L (ref 135–145)
Total Bilirubin: 0.7 mg/dL (ref 0.3–1.2)
Total Protein: 7.4 g/dL (ref 6.5–8.1)

## 2022-03-10 MED ORDER — DEXTROSE 5 % IV SOLN: Freq: Once | INTRAVENOUS | Status: AC

## 2022-03-10 MED ORDER — SODIUM CHLORIDE 0.9 % IV SOLN
10.0000 mg | Freq: Once | INTRAVENOUS | Status: AC
  Administered 2022-03-10: 10 mg via INTRAVENOUS
  Filled 2022-03-10: qty 10

## 2022-03-10 MED ORDER — SODIUM CHLORIDE 0.9% FLUSH
10.0000 mL | Freq: Once | INTRAVENOUS | Status: AC
  Administered 2022-03-10: 10 mL

## 2022-03-10 MED ORDER — FLUOROURACIL CHEMO INJECTION 2.5 GM/50ML
400.0000 mg/m2 | Freq: Once | INTRAVENOUS | Status: AC
  Administered 2022-03-10: 900 mg via INTRAVENOUS
  Filled 2022-03-10: qty 18

## 2022-03-10 MED ORDER — LEUCOVORIN CALCIUM INJECTION 350 MG
400.0000 mg/m2 | Freq: Once | INTRAVENOUS | Status: AC
  Administered 2022-03-10: 892 mg via INTRAVENOUS
  Filled 2022-03-10: qty 44.6

## 2022-03-10 MED ORDER — SODIUM CHLORIDE 0.9 % IV SOLN
2400.0000 mg/m2 | INTRAVENOUS | Status: DC
  Administered 2022-03-10: 5350 mg via INTRAVENOUS
  Filled 2022-03-10: qty 107

## 2022-03-10 MED ORDER — OXALIPLATIN CHEMO INJECTION 100 MG/20ML
85.0000 mg/m2 | Freq: Once | INTRAVENOUS | Status: AC
  Administered 2022-03-10: 190 mg via INTRAVENOUS
  Filled 2022-03-10: qty 38

## 2022-03-10 MED ORDER — PALONOSETRON HCL INJECTION 0.25 MG/5ML
0.2500 mg | Freq: Once | INTRAVENOUS | Status: AC
  Administered 2022-03-10: 0.25 mg via INTRAVENOUS
  Filled 2022-03-10: qty 5

## 2022-03-10 NOTE — Progress Notes (Signed)
[place]   [phone] [phone]) [phone]   Clinic Follow up Note   Patient Care Team: Arthur Holms, NP as PCP - General (Nurse Practitioner) Truitt Merle, MD as Consulting Physician (Oncology)  Date of Service:  03/10/2022  CHIEF COMPLAINT: f/u of colorectal cancer  CURRENT THERAPY:  Adjuvant FOLFOX, starting 12/23/21  ASSESSMENT & PLAN:  George Watkins is a 71 y.o. male with   1. Colorectal cancer, adenocarcinoma of the ascending colon, pT3N2aMx, at least stage IIIB, MSS -Colonoscopy performed on 10/13/21 for IDA and abdominal pain showed a 4 cm mass near the hepatic flexure. Pathology confirmed adenocarcinoma, MMR normal. -Baseline CEA was elevated at 25.9 on 10/13/21 -CT C/A/P on 10/21/21 showed: circumferential mass in the mid ascending colon; mildly prominent, asymmetric right external iliac lymph node, nonspecific but suspicious.   -PET scan on 11/09/21 showed: hypermetabolism to colon mass and adjacent mildly enlarged ileocolonic mesenteric lymph nodes. Otherwise, no definitive evidence of additional spread. -partial colectomy on 11/17/21 with Dr. Marcello Moores showed 5.6 cm invasive moderately differentiated adenocarcinoma invading into pericolic adipose. Margins negative, but metastatic carcinoma in 6 lymph nodes (6/15). Two additional tubular adenomas without high-grade dysplasia noted. MMR normal and MSI stable.  -given his multiple (6) positive lymph nodes, I recommend adjuvant chemotherapy with FOLFOX for 6 months. He began treatment on 12/23/21 and tolerated very well overall with mild side effects.  -labs reviewed, adequate to proceed with C6 FOLFOX today as scheduled.  -Surveillance CT scan in mid July.   2. Symptom Management: Low appetite, Mild neuropathy -he reports he only eats one "good meal" a day. I again encouraged him to supplement with protein drinks like Ensure. -he reports some mild numbness/tingling in his fingers. He has only mild sensation  decrease on tuning fork exam 02/17/22. We will monitor and adjust oxali dose as needed.   3. Iron deficiency anemia  -Patient currently taking ferrous sulfate BID and compliant. Has had good response to oral iron supplement, has not required additional treatment. -hgb overall stable since surgery, 11.3 today (03/10/22)   4. Renal Lesion -incidental finding on CT CAP 10/21/21 -PET on 11/09/21 showed no metabolic activity, favored to reflect hemorrhagic/proteinaceous cysts. Plan to get renal US after he completes chemo    5.  Left axillary adenopathy -Seen on initial CT scan, positive on PET in 10/2021. -Attempted biopsy, ultrasound showed normal-appearing left axillary adenopathy, biopsy was deferred. -will f/u by CT scan in May or June      PLAN: -proceed with C6 FOLFOX today  -lab, flush, f/u, and FOLFOX in 2 and 4 weeks   No problem-specific Assessment & Plan notes found for this encounter.   SUMMARY OF ONCOLOGIC HISTORY: Oncology History Overview Note   Cancer Staging  Colorectal cancer [name]) Staging form: Colon and Rectum, AJCC 8th Edition - Pathologic stage from 11/17/2021: Stage IIIB (pT3, pN2a, cM0) - Signed by Truitt Merle, MD on 12/03/2021    Cancer of right colon [facility])  10/13/2021 Procedure   Upper GI endoscopy by Dr. Ardis Hughs for IDA and abdominal pain  Findings:  - The esophagus was normal. - The stomach was normal. - The examined duodenum was normal.   10/13/2021 Procedure   Colonoscopy Indications: Iron deficiency  - A fungating and ulcerated partially obstructing, clearly malignant mass was found at the hepatic flexure. The mass was circumferential and measured 4cm in length. Biopsies were taken with a cold forceps for histology and then the mucosa just distal to the mass was  tattooed with an injection of Spot (carbon black). jar 1 Findings: - Two pedunculated polyps were found in the mid transverse colon. The polyps were 8 to 11 mm in size. These polyps were  removed with a hot snare. Resection and retrieval were complete. jar 2 - Five pedunculated and sessile polyps were found in the distal transverse colon. The polyps were 4 to 14 mm in size. Two were removed with hot snare, three were removed with cold snare. Resection and retrieval were complete. jar 3 - A 20 mm polyp was found in the distal transverse colon. The polyp was pedunculated. The polyp was removed with a hot snare. Resection and retrieval were complete. jar 4 - The exam was otherwise without abnormality on direct and retroflexion views.   10/13/2021 Pathology Results   REPORT OF SURGICAL PATHOLOGY Accession: [id number]  Diagnosis 1. Hepatic Flexure Biopsy - ADENOCARCINOMA, SEE NOTE   10/21/2021 Imaging   CT CHEST ABDOMEN PELVIS W CONTRAST  IMPRESSION: 1. There is a circumferential mass of the mid ascending colon measuring approximately 5 cm in length, in keeping with newly diagnosed primary colon malignancy. 2. Adjacent to the colon mass, there is mesenteric fat stranding suggesting invasion beyond the muscularis as well as abnormally enlarged mesocolonic lymph nodes. 3. Mildly prominent, asymmetric right external iliac lymph nodes, nonspecific although suspicious for early nodal metastatic disease. 4. Asymmetrically prominent left axillary lymph nodes, nonspecific, these nodes less favored to reflect metastatic disease given location in the absence of other evidence of metastatic disease above the diaphragm. Both external iliac and axillary lymph nodes could be characterized for abnormal metabolic activity by PET-CT. 5. No evidence of organ metastatic disease in the chest, abdomen, or pelvis. 6. Exophytic soft tissue attenuation lesion of the anterior midportion of the left kidney measuring 1.2 cm. Partially exophytic soft tissue attenuation lesion of the posteroinferior pole of the left kidney measuring 1.9 x 1.7 cm. These may reflect hemorrhagic or proteinaceous  cysts but are incompletely characterized and incidental renal cell carcinoma is not excluded. Recommend initial renal ultrasound to establish definitively cystic character, multiphasic contrast enhanced CT or MRI may however be further required to exclude malignancy. 7. Prostatomegaly with thickening of the decompressed urinary bladder wall, likely due to chronic outlet obstruction.   Aortic Atherosclerosis (ICD10-I70.0).   10/28/2021 Initial Diagnosis   Colorectal cancer ([place])   11/17/2021 Cancer Staging   Staging form: Colon and Rectum, AJCC 8th Edition - Pathologic stage from 11/17/2021: Stage IIIB (pT3, pN2a, cM0) - Signed by Truitt Merle, MD on 12/03/2021 Histologic grading system: 4 grade system Histologic grade (G): G2 Residual tumor (R): R0 - None    11/17/2021 Definitive Surgery   FINAL MICROSCOPIC DIAGNOSIS:   A. COLON, RIGHT, RESECTION:  Invasive moderately differentiated adenocarcinoma invading into  pericolic adipose (pT3)  Tumor measures 5.2 x 3.6 x 0.8 cm  Margins free of carcinoma and adenomatous change  Six of fifteen pericolic lymph nodes with metastatic carcinoma (6/15, pN2a)  Two additional tubular adenomas without high-grade dysplasia, largest measuring 1.3 cm  Benign appendix   ADDENDUM:  Mismatch Repair Protein (IHC)  SUMMARY INTERPRETATION: NORMAL  Microsatellite Instability (MSI) Result: STABLE   12/23/2021 -  Chemotherapy   Patient is on Treatment Plan : COLORECTAL FOLFOX q14d x 6 months         INTERVAL HISTORY:  George Watkins is here for a follow up of colorectal cancer. He was last seen by me on 02/17/22. He presents to the clinic alone. He  reports he is doing well overall. He tells me he enjoyed his mothers day and notes his sons did the cooking.   All other systems were reviewed with the patient and are negative.  MEDICAL HISTORY:  Past Medical History:  Diagnosis Date   Anemia    Arthritis    Colon cancer ([place])    Coronary artery disease     GERD (gastroesophageal reflux disease)    Heart murmur    Hiatal hernia    Hypertension    Pneumonia    Sleep apnea     SURGICAL HISTORY: Past Surgical History:  Procedure Laterality Date   ANKLE SURGERY     CIRCUMCISION     COLONOSCOPY     HERNIA REPAIR     PORTACATH PLACEMENT N/A 12/09/2021   Procedure: INSERTION PORT-A-CATH with ultrasound;  Surgeon: Leighton Ruff, MD;  Location: [facility];  Service: General;  Laterality: N/A;    I have reviewed the social history and family history with the patient and they are unchanged from previous note.  ALLERGIES:  is allergic to sulfa antibiotics.  MEDICATIONS:  Current Outpatient Medications  Medication Sig Dispense Refill   amLODipine (NORVASC) 5 MG tablet Take 5 mg by mouth daily.     atorvastatin (LIPITOR) 10 MG tablet Take 10 mg by mouth at bedtime.     cholecalciferol (VITAMIN D3) 25 MCG (1000 UNIT) tablet Take 1,000 Units by mouth daily.     cloNIDine (CATAPRES) 0.2 MG tablet Take 0.2 mg by mouth 2 (two) times daily.     cyclobenzaprine (FLEXERIL) 5 MG tablet Take 5 mg by mouth at bedtime as needed for muscle spasms.     ferrous sulfate 325 (65 FE) MG tablet Take 1 tablet (325 mg total) by mouth 2 (two) times daily with a meal. 30 tablet 0   labetalol (NORMODYNE) 200 MG tablet Take 200 mg by mouth 2 (two) times daily.     lidocaine-prilocaine (EMLA) cream Apply to affected area once 30 g 3   losartan-hydrochlorothiazide (HYZAAR) 100-25 MG tablet Take 1 tablet by mouth daily.     Multiple Vitamins-Minerals (MULTIVITAMIN MEN 50+ PO) Take 1 tablet by mouth daily.     ondansetron (ZOFRAN) 8 MG tablet Take 1 tablet (8 mg total) by mouth 2 (two) times daily as needed for refractory nausea / vomiting. Start on day 3 after chemotherapy. 30 tablet 1   pantoprazole (PROTONIX) 40 MG tablet Take 40 mg by mouth daily.     potassium chloride (KLOR-CON M) 10 MEQ tablet Take 1 tablet (10 mEq total) by mouth 2 (two) times daily. 60 tablet 0    prochlorperazine (COMPAZINE) 10 MG tablet Take 1 tablet (10 mg total) by mouth every 6 (six) hours as needed (Nausea or vomiting). 30 tablet 1   traMADol (ULTRAM) 50 MG tablet Take 1-2 tablets (50-100 mg total) by mouth every 6 (six) hours as needed for moderate pain. 30 tablet 0   No current facility-administered medications for this visit.   Facility-Administered Medications Ordered in Other Visits  Medication Dose Route Frequency Provider Last Rate Last Admin   fluorouracil (ADRUCIL) 5,350 mg in sodium chloride 0.9 % 143 mL chemo infusion  2,400 mg/m2 (Treatment Plan Recorded) Intravenous 1 day or 1 dose Truitt Merle, MD       fluorouracil (ADRUCIL) chemo injection 900 mg  400 mg/m2 (Treatment Plan Recorded) Intravenous Once Truitt Merle, MD       leucovorin 892 mg in dextrose 5 % 250 mL infusion  400 mg/m2 (Treatment Plan Recorded) Intravenous Once Truitt Merle, MD       oxaliplatin (ELOXATIN) 190 mg in dextrose 5 % 500 mL chemo infusion  85 mg/m2 (Treatment Plan Recorded) Intravenous Once Truitt Merle, MD        PHYSICAL EXAMINATION: ECOG PERFORMANCE STATUS: 1 - Symptomatic but completely ambulatory  Vitals:   03/10/22 1004  BP: (!) 145/80  Pulse: (!) 58  Resp: 18  Temp: 98.1 F (36.7 C)  SpO2: 100%   Wt Readings from Last 3 Encounters:  03/10/22 215 lb 12.8 oz (97.9 kg)  02/17/22 215 lb 3 oz (97.6 kg)  02/03/22 218 lb 9.6 oz (99.2 kg)     GENERAL:alert, no distress and comfortable SKIN: skin color normal, no rashes or significant lesions EYES: normal, Conjunctiva are pink and non-injected, sclera clear  NEURO: alert & oriented x 3 with fluent speech  LABORATORY DATA:  I have reviewed the data as listed    Latest Ref Rng & Units 03/10/2022    9:37 AM 02/17/2022   10:22 AM 02/03/2022   11:26 AM  CBC  WBC 4.0 - 10.5 K/uL 4.6   4.5   4.7    Hemoglobin 13.0 - 17.0 g/dL 11.3   12.0   11.8    Hematocrit 39.0 - 52.0 % 35.7   38.1   37.7    Platelets 150 - 400 K/uL 192   121   155           Latest Ref Rng & Units 03/10/2022    9:37 AM 02/17/2022   10:22 AM 02/03/2022   11:26 AM  CMP  Glucose 70 - 99 mg/dL 173   178   152    BUN 8 - 23 mg/dL 17   15   13     Creatinine 0.61 - 1.24 mg/dL 1.20   1.25   1.21    Sodium 135 - 145 mmol/L 137   141   139    Potassium 3.5 - 5.1 mmol/L 3.4   3.1   3.1    Chloride 98 - 111 mmol/L 103   103   102    CO2 22 - 32 mmol/L 30   32   33    Calcium 8.9 - 10.3 mg/dL 9.5   9.4   9.2    Total Protein 6.5 - 8.1 g/dL 7.4   7.2   7.1    Total Bilirubin 0.3 - 1.2 mg/dL 0.7   1.0   0.8    Alkaline Phos 38 - 126 U/L 83   81   77    AST 15 - 41 U/L 21   35   26    ALT 0 - 44 U/L 17   37   25        RADIOGRAPHIC STUDIES: I have personally reviewed the radiological images as listed and agreed with the findings in the report. No results found.    No orders of the defined types were placed in this encounter.  All questions were answered. The patient knows to call the clinic with any problems, questions or concerns. No barriers to learning was detected. The total time spent in the appointment was 30 minutes.     Truitt Merle, MD 03/10/2022   I, Wilburn Mylar, am acting as scribe for Truitt Merle, MD.   I have reviewed the above documentation for accuracy and completeness, and I agree with the above.

## 2022-03-11 ENCOUNTER — Telehealth: Payer: Self-pay | Admitting: Hematology

## 2022-03-11 NOTE — Telephone Encounter (Signed)
Scheduled follow-up appointments per 5/18 los. Patient is aware. Mailed calendar. 

## 2022-03-12 ENCOUNTER — Inpatient Hospital Stay: Payer: Medicare PPO

## 2022-03-12 ENCOUNTER — Other Ambulatory Visit: Payer: Self-pay | Admitting: Hematology

## 2022-03-12 ENCOUNTER — Other Ambulatory Visit: Payer: Self-pay

## 2022-03-12 VITALS — BP 124/85 | HR 49 | Temp 97.7°F | Resp 17

## 2022-03-12 DIAGNOSIS — C182 Malignant neoplasm of ascending colon: Secondary | ICD-10-CM

## 2022-03-12 DIAGNOSIS — Z5111 Encounter for antineoplastic chemotherapy: Secondary | ICD-10-CM | POA: Diagnosis not present

## 2022-03-12 MED ORDER — HEPARIN SOD (PORK) LOCK FLUSH 100 UNIT/ML IV SOLN
500.0000 [IU] | Freq: Once | INTRAVENOUS | Status: AC | PRN
  Administered 2022-03-12: 500 [IU]

## 2022-03-12 MED ORDER — SODIUM CHLORIDE 0.9% FLUSH
10.0000 mL | INTRAVENOUS | Status: DC | PRN
  Administered 2022-03-12: 10 mL

## 2022-03-14 ENCOUNTER — Encounter: Payer: Self-pay | Admitting: Hematology

## 2022-03-15 ENCOUNTER — Other Ambulatory Visit: Payer: Self-pay

## 2022-03-15 ENCOUNTER — Telehealth: Payer: Self-pay

## 2022-03-15 NOTE — Telephone Encounter (Signed)
Pt called c/o pain/irritation to his rt back gums (upper & lower).  Pt denied bleeding, redness, or white patches in his mouth.  Instructed pt to refrain from wearing partial/s for a while to give his gums a break and reduce the irritation they maybe causing.  Instructed pt to gargle 4 times a day with warm water mixed with baking soda and to apply some Or[place]el to his gums throughout the day.  Pt verbalized understanding of instructions.  Instructed pt to NOT put ice or anything cold on his gums since he's getting Oxaliplatin.  Also instructed pt to contact Dr. Ernestina Penna office should you start experiencing redness, bleeding, or white patches within his mouth.  Pt verbalized understanding of instructions.  Notified Dr. Burr Medico of pt's complaint.

## 2022-03-22 MED FILL — Dexamethasone Sodium Phosphate Inj 100 MG/10ML: INTRAMUSCULAR | Qty: 1 | Status: AC

## 2022-03-23 ENCOUNTER — Inpatient Hospital Stay: Payer: Medicare PPO

## 2022-03-23 ENCOUNTER — Other Ambulatory Visit: Payer: Self-pay

## 2022-03-23 ENCOUNTER — Inpatient Hospital Stay: Payer: Medicare PPO | Admitting: Physician Assistant

## 2022-03-23 VITALS — BP 135/90 | HR 54 | Temp 98.3°F | Wt 216.4 lb

## 2022-03-23 DIAGNOSIS — C182 Malignant neoplasm of ascending colon: Secondary | ICD-10-CM | POA: Diagnosis not present

## 2022-03-23 DIAGNOSIS — Z95828 Presence of other vascular implants and grafts: Secondary | ICD-10-CM

## 2022-03-23 DIAGNOSIS — C19 Malignant neoplasm of rectosigmoid junction: Secondary | ICD-10-CM

## 2022-03-23 DIAGNOSIS — Z5111 Encounter for antineoplastic chemotherapy: Secondary | ICD-10-CM | POA: Diagnosis not present

## 2022-03-23 LAB — CBC WITH DIFFERENTIAL (CANCER CENTER ONLY)
Abs Immature Granulocytes: 0 10*3/uL (ref 0.00–0.07)
Basophils Absolute: 0.1 10*3/uL (ref 0.0–0.1)
Basophils Relative: 1 %
Eosinophils Absolute: 0.1 10*3/uL (ref 0.0–0.5)
Eosinophils Relative: 3 %
HCT: 33.7 % — ABNORMAL LOW (ref 39.0–52.0)
Hemoglobin: 11.1 g/dL — ABNORMAL LOW (ref 13.0–17.0)
Immature Granulocytes: 0 %
Lymphocytes Relative: 30 %
Lymphs Abs: 1.1 10*3/uL (ref 0.7–4.0)
MCH: 28.6 pg (ref 26.0–34.0)
MCHC: 32.9 g/dL (ref 30.0–36.0)
MCV: 86.9 fL (ref 80.0–100.0)
Monocytes Absolute: 0.6 10*3/uL (ref 0.1–1.0)
Monocytes Relative: 17 %
Neutro Abs: 1.9 10*3/uL (ref 1.7–7.7)
Neutrophils Relative %: 49 %
Platelet Count: 150 10*3/uL (ref 150–400)
RBC: 3.88 MIL/uL — ABNORMAL LOW (ref 4.22–5.81)
RDW: 16 % — ABNORMAL HIGH (ref 11.5–15.5)
WBC Count: 3.8 10*3/uL — ABNORMAL LOW (ref 4.0–10.5)
nRBC: 0 % (ref 0.0–0.2)

## 2022-03-23 LAB — CMP (CANCER CENTER ONLY)
ALT: 21 U/L (ref 0–44)
AST: 26 U/L (ref 15–41)
Albumin: 3.5 g/dL (ref 3.5–5.0)
Alkaline Phosphatase: 95 U/L (ref 38–126)
Anion gap: 4 — ABNORMAL LOW (ref 5–15)
BUN: 16 mg/dL (ref 8–23)
CO2: 31 mmol/L (ref 22–32)
Calcium: 9.7 mg/dL (ref 8.9–10.3)
Chloride: 104 mmol/L (ref 98–111)
Creatinine: 1.26 mg/dL — ABNORMAL HIGH (ref 0.61–1.24)
GFR, Estimated: >60 mL/min (ref >60)
Glucose, Bld: 129 mg/dL — ABNORMAL HIGH (ref 70–99)
Potassium: 3.4 mmol/L — ABNORMAL LOW (ref 3.5–5.1)
Sodium: 139 mmol/L (ref 135–145)
Total Bilirubin: 0.8 mg/dL (ref 0.3–1.2)
Total Protein: 7.1 g/dL (ref 6.5–8.1)

## 2022-03-23 LAB — CEA (IN HOUSE-CHCC): CEA (CHCC-In House): 1.15 ng/mL (ref 0.00–5.00)

## 2022-03-23 MED ORDER — FLUOROURACIL CHEMO INJECTION 2.5 GM/50ML
400.0000 mg/m2 | Freq: Once | INTRAVENOUS | Status: AC
  Administered 2022-03-23: 900 mg via INTRAVENOUS
  Filled 2022-03-23: qty 18

## 2022-03-23 MED ORDER — SODIUM CHLORIDE 0.9 % IV SOLN
10.0000 mg | Freq: Once | INTRAVENOUS | Status: AC
  Administered 2022-03-23: 10 mg via INTRAVENOUS
  Filled 2022-03-23: qty 10

## 2022-03-23 MED ORDER — OXALIPLATIN CHEMO INJECTION 100 MG/20ML
85.0000 mg/m2 | Freq: Once | INTRAVENOUS | Status: AC
  Administered 2022-03-23: 190 mg via INTRAVENOUS
  Filled 2022-03-23: qty 38

## 2022-03-23 MED ORDER — SODIUM CHLORIDE 0.9% FLUSH: 10.0000 mL | INTRAVENOUS | Status: DC | PRN

## 2022-03-23 MED ORDER — DEXTROSE 5 % IV SOLN: Freq: Once | INTRAVENOUS | Status: AC

## 2022-03-23 MED ORDER — HEPARIN SOD (PORK) LOCK FLUSH 100 UNIT/ML IV SOLN: 500.0000 [IU] | Freq: Once | INTRAVENOUS | Status: DC | PRN

## 2022-03-23 MED ORDER — PALONOSETRON HCL INJECTION 0.25 MG/5ML
0.2500 mg | Freq: Once | INTRAVENOUS | Status: AC
  Administered 2022-03-23: 0.25 mg via INTRAVENOUS
  Filled 2022-03-23: qty 5

## 2022-03-23 MED ORDER — LEUCOVORIN CALCIUM INJECTION 350 MG
400.0000 mg/m2 | Freq: Once | INTRAVENOUS | Status: AC
  Administered 2022-03-23: 892 mg via INTRAVENOUS
  Filled 2022-03-23: qty 44.6

## 2022-03-23 MED ORDER — SODIUM CHLORIDE 0.9 % IV SOLN
2400.0000 mg/m2 | INTRAVENOUS | Status: DC
  Administered 2022-03-23: 5350 mg via INTRAVENOUS
  Filled 2022-03-23: qty 107

## 2022-03-23 MED ORDER — SODIUM CHLORIDE 0.9% FLUSH
10.0000 mL | Freq: Once | INTRAVENOUS | Status: AC
  Administered 2022-03-23: 10 mL

## 2022-03-23 NOTE — Progress Notes (Signed)
[place]   Telephone:(336) [phone] Fax:(336) [phone]   Clinic Follow up Note   Patient Care Team: Arthur Holms, NP as PCP - General (Nurse Practitioner) Truitt Merle, MD as Consulting Physician (Oncology)  Date of Service:  03/23/2022  CHIEF COMPLAINT: f/u of colorectal cancer  CURRENT THERAPY:  Adjuvant FOLFOX, started 12/23/21  SUMMARY OF ONCOLOGIC HISTORY: Oncology History Overview Note   Cancer Staging  Colorectal cancer [facility]) Staging form: Colon and Rectum, AJCC 8th Edition - Pathologic stage from 11/17/2021: Stage IIIB (pT3, pN2a, cM0) - Signed by Truitt Merle, MD on 12/03/2021    Cancer of right colon [facility])  10/13/2021 Procedure   Upper GI endoscopy by Dr. Ardis Hughs for IDA and abdominal pain  Findings:  - The esophagus was normal. - The stomach was normal. - The examined duodenum was normal.   10/13/2021 Procedure   Colonoscopy Indications: Iron deficiency  - A fungating and ulcerated partially obstructing, clearly malignant mass was found at the hepatic flexure. The mass was circumferential and measured 4cm in length. Biopsies were taken with a cold forceps for histology and then the mucosa just distal to the mass was tattooed with an injection of Spot (carbon black). jar 1 Findings: - Two pedunculated polyps were found in the mid transverse colon. The polyps were 8 to 11 mm in size. These polyps were removed with a hot snare. Resection and retrieval were complete. jar 2 - Five pedunculated and sessile polyps were found in the distal transverse colon. The polyps were 4 to 14 mm in size. Two were removed with hot snare, three were removed with cold snare. Resection and retrieval were complete. jar 3 - A 20 mm polyp was found in the distal transverse colon. The polyp was pedunculated. The polyp was removed with a hot snare. Resection and retrieval were complete. jar 4 - The exam was otherwise without abnormality on direct and retroflexion views.    10/13/2021 Pathology Results   REPORT OF SURGICAL PATHOLOGY Accession: [id number]  Diagnosis 1. Hepatic Flexure Biopsy - ADENOCARCINOMA, SEE NOTE   10/21/2021 Imaging   CT CHEST ABDOMEN PELVIS W CONTRAST  IMPRESSION: 1. There is a circumferential mass of the mid ascending colon measuring approximately 5 cm in length, in keeping with newly diagnosed primary colon malignancy. 2. Adjacent to the colon mass, there is mesenteric fat stranding suggesting invasion beyond the muscularis as well as abnormally enlarged mesocolonic lymph nodes. 3. Mildly prominent, asymmetric right external iliac lymph nodes, nonspecific although suspicious for early nodal metastatic disease. 4. Asymmetrically prominent left axillary lymph nodes, nonspecific, these nodes less favored to reflect metastatic disease given location in the absence of other evidence of metastatic disease above the diaphragm. Both external iliac and axillary lymph nodes could be characterized for abnormal metabolic activity by PET-CT. 5. No evidence of organ metastatic disease in the chest, abdomen, or pelvis. 6. Exophytic soft tissue attenuation lesion of the anterior midportion of the left kidney measuring 1.2 cm. Partially exophytic soft tissue attenuation lesion of the posteroinferior pole of the left kidney measuring 1.9 x 1.7 cm. These may reflect hemorrhagic or proteinaceous cysts but are incompletely characterized and incidental renal cell carcinoma is not excluded. Recommend initial renal ultrasound to establish definitively cystic character, multiphasic contrast enhanced CT or MRI may however be further required to exclude malignancy. 7. Prostatomegaly with thickening of the decompressed urinary bladder wall, likely due to chronic outlet obstruction.   Aortic Atherosclerosis (ICD10-I70.0).   10/28/2021 Initial Diagnosis   Colorectal cancer (  [place])   11/17/2021 Cancer Staging   Staging form: Colon and Rectum, AJCC 8th  Edition - Pathologic stage from 11/17/2021: Stage IIIB (pT3, pN2a, cM0) - Signed by Truitt Merle, MD on 12/03/2021 Histologic grading system: 4 grade system Histologic grade (G): G2 Residual tumor (R): R0 - None    11/17/2021 Definitive Surgery   FINAL MICROSCOPIC DIAGNOSIS:   A. COLON, RIGHT, RESECTION:  Invasive moderately differentiated adenocarcinoma invading into  pericolic adipose (pT3)  Tumor measures 5.2 x 3.6 x 0.8 cm  Margins free of carcinoma and adenomatous change  Six of fifteen pericolic lymph nodes with metastatic carcinoma (6/15, pN2a)  Two additional tubular adenomas without high-grade dysplasia, largest measuring 1.3 cm  Benign appendix   ADDENDUM:  Mismatch Repair Protein (IHC)  SUMMARY INTERPRETATION: NORMAL  Microsatellite Instability (MSI) Result: STABLE   12/23/2021 -  Chemotherapy   Patient is on Treatment Plan : COLORECTAL FOLFOX q14d x 6 months         INTERVAL HISTORY:  George Watkins is here for a follow up of colorectal cancer. He was last seen by Dr. Burr Medico on 03/10/2022. He presents to the clinic alone.  History: Reports that his energy level is decreased for 3 to 4 days after treatment but resolved on its own.  He is able to complete his daily activities on his own.  He does experience metallic taste which affects his appetite.  But he is trying to maintain his weight.  He denies nausea or vomiting episodes.  He takes antiemetics as needed.  He reports his bowel habits are unchanged without any recurrent episodes of diarrhea or constipation.  He denies easy bruising or signs of active bleeding.  He does experience cold sensitivity and occasional tingling in his left hand.  He denies any residual neuropathy at this time.  He reports occasional episodes of mouth sores which improved with salt water rinses.  He denies fevers, chills, night sweats, shortness of breath, chest pain or cough.  He has no other complaints.  Rest of the 10 point ROS is below   All  other systems were reviewed with the patient and are negative.  MEDICAL HISTORY:  Past Medical History:  Diagnosis Date   Anemia    Arthritis    Colon cancer ([place])    Coronary artery disease    GERD (gastroesophageal reflux disease)    Heart murmur    Hiatal hernia    Hypertension    Pneumonia    Sleep apnea     SURGICAL HISTORY: Past Surgical History:  Procedure Laterality Date   ANKLE SURGERY     CIRCUMCISION     COLONOSCOPY     HERNIA REPAIR     PORTACATH PLACEMENT N/A 12/09/2021   Procedure: INSERTION PORT-A-CATH with ultrasound;  Surgeon: Leighton Ruff, MD;  Location: [facility];  Service: General;  Laterality: N/A;    I have reviewed the social history and family history with the patient and they are unchanged from previous note.  ALLERGIES:  is allergic to sulfa antibiotics.  MEDICATIONS:  Current Outpatient Medications  Medication Sig Dispense Refill   amLODipine (NORVASC) 5 MG tablet Take 5 mg by mouth daily.     atorvastatin (LIPITOR) 10 MG tablet Take 10 mg by mouth at bedtime.     cloNIDine (CATAPRES) 0.2 MG tablet Take 0.2 mg by mouth 2 (two) times daily.     ferrous sulfate 325 (65 FE) MG tablet Take 1 tablet (325 mg total) by mouth 2 (two)  times daily with a meal. 30 tablet 0   KLOR-CON M10 10 MEQ tablet TAKE 1 TABLET BY MOUTH 2 TIMES DAILY. 60 tablet 0   labetalol (NORMODYNE) 200 MG tablet Take 200 mg by mouth 2 (two) times daily.     lidocaine-prilocaine (EMLA) cream Apply to affected area once 30 g 3   losartan-hydrochlorothiazide (HYZAAR) 100-25 MG tablet Take 1 tablet by mouth daily.     Multiple Vitamins-Minerals (MULTIVITAMIN MEN 50+ PO) Take 1 tablet by mouth daily.     ondansetron (ZOFRAN) 8 MG tablet Take 1 tablet (8 mg total) by mouth 2 (two) times daily as needed for refractory nausea / vomiting. Start on day 3 after chemotherapy. 30 tablet 1   cholecalciferol (VITAMIN D3) 25 MCG (1000 UNIT) tablet Take 1,000 Units by mouth daily. (Patient not  taking: Reported on 03/23/2022)     cyclobenzaprine (FLEXERIL) 5 MG tablet Take 5 mg by mouth at bedtime as needed for muscle spasms. (Patient not taking: Reported on 03/23/2022)     pantoprazole (PROTONIX) 40 MG tablet Take 40 mg by mouth daily. (Patient not taking: Reported on 03/23/2022)     prochlorperazine (COMPAZINE) 10 MG tablet Take 1 tablet (10 mg total) by mouth every 6 (six) hours as needed (Nausea or vomiting). (Patient not taking: Reported on 03/23/2022) 30 tablet 1   traMADol (ULTRAM) 50 MG tablet Take 1-2 tablets (50-100 mg total) by mouth every 6 (six) hours as needed for moderate pain. (Patient not taking: Reported on 03/23/2022) 30 tablet 0   No current facility-administered medications for this visit.    PHYSICAL EXAMINATION: ECOG PERFORMANCE STATUS: 1 - Symptomatic but completely ambulatory  Vitals:   03/23/22 1049  BP: 135/90  Pulse: (!) 54  Temp: 98.3 F (36.8 C)  SpO2: 99%   Wt Readings from Last 3 Encounters:  03/23/22 216 lb 6.4 oz (98.2 kg)  03/10/22 215 lb 12.8 oz (97.9 kg)  02/17/22 215 lb 3 oz (97.6 kg)     GENERAL:alert, no distress and comfortable SKIN: skin color normal, no rashes or significant lesions EYES: normal, Conjunctiva are pink and non-injected, sclera clear  NEURO: alert & oriented x 3 with fluent speech  LABORATORY DATA:  I have reviewed the data as listed    Latest Ref Rng & Units 03/23/2022   10:23 AM 03/10/2022    9:37 AM 02/17/2022   10:22 AM  CBC  WBC 4.0 - 10.5 K/uL 3.8   4.6   4.5    Hemoglobin 13.0 - 17.0 g/dL 11.1   11.3   12.0    Hematocrit 39.0 - 52.0 % 33.7   35.7   38.1    Platelets 150 - 400 K/uL 150   192   121          Latest Ref Rng & Units 03/10/2022    9:37 AM 02/17/2022   10:22 AM 02/03/2022   11:26 AM  CMP  Glucose 70 - 99 mg/dL 173   178   152    BUN 8 - 23 mg/dL _0 Creatinine 0.61 - 1.24 mg/dL 1.20   1.25   1.21    Sodium 135 - 145 mmol/L 137   141   139    Potassium 3.5 - 5.1 mmol/L 3.4   3.1    3.1    Chloride 98 - 111 mmol/L 103   103   102    CO2 22 - 32 mmol/L  30   32   33    Calcium 8.9 - 10.3 mg/dL 9.5   9.4   9.2    Total Protein 6.5 - 8.1 g/dL 7.4   7.2   7.1    Total Bilirubin 0.3 - 1.2 mg/dL 0.7   1.0   0.8    Alkaline Phos 38 - 126 U/L 83   81   77    AST 15 - 41 U/L 21   35   26    ALT 0 - 44 U/L 17   37   25        RADIOGRAPHIC STUDIES: I have personally reviewed the radiological images as listed and agreed with the findings in the report. No results found.   ASSESSMENT & PLAN:  CARTEL MAUSS is a 71 y.o. male with   1. Colorectal cancer, adenocarcinoma of the ascending colon, pT3N2aMx, at least stage IIIB, MSS -Colonoscopy performed on 10/13/21 for IDA and abdominal pain showed a 4 cm mass near the hepatic flexure. Pathology confirmed adenocarcinoma, MMR normal. -Baseline CEA was elevated at 25.9 on 10/13/21 -CT C/A/P on 10/21/21 showed: circumferential mass in the mid ascending colon; mildly prominent, asymmetric right external iliac lymph node, nonspecific but suspicious.   -PET scan on 11/09/21 showed: hypermetabolism to colon mass and adjacent mildly enlarged ileocolonic mesenteric lymph nodes. Otherwise, no definitive evidence of additional spread. -partial colectomy on 11/17/21 with Dr. Marcello Moores showed 5.6 cm invasive moderately differentiated adenocarcinoma invading into pericolic adipose. Margins negative, but metastatic carcinoma in 6 lymph nodes (6/15). Two additional tubular adenomas without high-grade dysplasia noted. MMR normal and MSI stable.  -given his multiple (6) positive lymph nodes, recommendation is adjuvant chemotherapy with FOLFOX for 6 months. He began treatment on 12/23/21.   2. Symptom Management: Low appetite, Mild neuropathy -Metallic taste is affecting appetite but weight is stable. Encouraged him to supplement with protein drinks like Ensure. -He reports some mild numbness/tingling in his fingers that is occasional.    3. Iron  deficiency anemia  -Patient currently taking ferrous sulfate BID and compliant. Has had good response to oral iron supplement, has not required additional treatment. -Hgb overall stable since surgery, 11.1 today   4. Renal Lesion -incidental finding on CT CAP 10/21/21 -PET on 11/09/21 showed no metabolic activity, favored to reflect hemorrhagic/proteinaceous cysts. Plan to get renal US after he completes chemo    5.  Left axillary adenopathy -Seen on initial CT scan, positive on PET in 10/2021. -Attempted biopsy, ultrasound showed normal-appearing left axillary adenopathy, biopsy was deferred. -will f/u by CT scan in May or June  6. Hypokalemia:   -Currently taking potassium chloride 10 mEq twice daily -Potassium level is 3.4 today, stable.     PLAN: -Labs from today were reviewed and adequate for treatment today. WBC 3.8, Hgb 11.1, Plt 150.  -Proceed with Cycle 7 of FOLFOX today -RTC in 2 weeks for labs, follow up visit with Dr. Burr Medico and prior to Cycle 8 of FOLFOX.  -Plan to obtain restaging CT scan in mid July.    All questions were answered. The patient knows to call the clinic with any problems, questions or concerns. No barriers to learning was detected.  I have spent a total of 30 minutes minutes of face-to-face and non-face-to-face time, preparing to see the patient, performing a medically appropriate examination, counseling and educating the patient, ordering tests, documenting clinical information in the electronic health record,and care coordination.   Dede Query PA-C Dept of  Hematology and [place] at [facility] Phone: [phone]

## 2022-03-23 NOTE — Patient Instructions (Signed)
[facility]   Discharge Instructions: Thank you for choosing [facility] to provide your oncology and hematology care.   If you have a lab appointment with the [facility], please go directly to the [facility] and check in at the registration area.   Wear comfortable clothing and clothing appropriate for easy access to any Portacath or PICC line.   We strive to give you quality time with your provider. You may need to reschedule your appointment if you arrive late (15 or more minutes).  Arriving late affects you and other patients whose appointments are after yours.  Also, if you miss three or more appointments without notifying the office, you may be dismissed from the clinic at the provider's discretion.      For prescription refill requests, have your pharmacy contact our office and allow 72 hours for refills to be completed.    Today you received the following chemotherapy and/or immunotherapy agents: oxaliplatin, leucovorin, fluorouracil      To help prevent nausea and vomiting after your treatment, we encourage you to take your nausea medication as directed.  BELOW ARE SYMPTOMS THAT SHOULD BE REPORTED IMMEDIATELY: *FEVER GREATER THAN 100.4 F (38 C) OR HIGHER *CHILLS OR SWEATING *NAUSEA AND VOMITING THAT IS NOT CONTROLLED WITH YOUR NAUSEA MEDICATION *UNUSUAL SHORTNESS OF BREATH *UNUSUAL BRUISING OR BLEEDING *URINARY PROBLEMS (pain or burning when urinating, or frequent urination) *BOWEL PROBLEMS (unusual diarrhea, constipation, pain near the anus) TENDERNESS IN MOUTH AND THROAT WITH OR WITHOUT PRESENCE OF ULCERS (sore throat, sores in mouth, or a toothache) UNUSUAL RASH, SWELLING OR PAIN  UNUSUAL VAGINAL DISCHARGE OR ITCHING   Items with * indicate a potential emergency and should be followed up as soon as possible or go to the Emergency Department if any problems should occur.  Please show the CHEMOTHERAPY ALERT CARD or  IMMUNOTHERAPY ALERT CARD at check-in to the Emergency Department and triage nurse.  Should you have questions after your visit or need to cancel or reschedule your appointment, please contact [facility]  Dept: [phone]  and follow the prompts.  Office hours are 8:00 a.m. to 4:30 p.m. Monday - Friday. Please note that voicemails left after 4:00 p.m. may not be returned until the following business day.  We are closed weekends and major holidays. You have access to a nurse at all times for urgent questions. Please call the main number to the clinic Dept: [phone] and follow the prompts.   For any non-urgent questions, you may also contact your provider using MyChart. We now offer e-Visits for anyone 70 and older to request care online for non-urgent symptoms. For details visit [email or web address].   Also download the MyChart app! Go to the app store, search "MyChart", open the app, select [facility], and log in with your MyChart username and password.  Due to Covid, a mask is required upon entering the hospital/clinic. If you do not have a mask, one will be given to you upon arrival. For doctor visits, patients may have 1 support person aged 70 or older with them. For treatment visits, patients cannot have anyone with them due to current Covid guidelines and our immunocompromised population.

## 2022-03-25 ENCOUNTER — Inpatient Hospital Stay: Payer: Medicare PPO | Attending: Physician Assistant

## 2022-03-25 ENCOUNTER — Other Ambulatory Visit: Payer: Self-pay

## 2022-03-25 DIAGNOSIS — D509 Iron deficiency anemia, unspecified: Secondary | ICD-10-CM | POA: Insufficient documentation

## 2022-03-25 DIAGNOSIS — Z5111 Encounter for antineoplastic chemotherapy: Secondary | ICD-10-CM | POA: Insufficient documentation

## 2022-03-25 DIAGNOSIS — G629 Polyneuropathy, unspecified: Secondary | ICD-10-CM | POA: Insufficient documentation

## 2022-03-25 DIAGNOSIS — R590 Localized enlarged lymph nodes: Secondary | ICD-10-CM | POA: Insufficient documentation

## 2022-03-25 DIAGNOSIS — E876 Hypokalemia: Secondary | ICD-10-CM | POA: Insufficient documentation

## 2022-03-25 DIAGNOSIS — R300 Dysuria: Secondary | ICD-10-CM | POA: Insufficient documentation

## 2022-03-25 DIAGNOSIS — I10 Essential (primary) hypertension: Secondary | ICD-10-CM | POA: Insufficient documentation

## 2022-03-25 DIAGNOSIS — C182 Malignant neoplasm of ascending colon: Secondary | ICD-10-CM | POA: Diagnosis present

## 2022-03-25 DIAGNOSIS — Z95828 Presence of other vascular implants and grafts: Secondary | ICD-10-CM

## 2022-03-25 MED ORDER — SODIUM CHLORIDE 0.9% FLUSH
10.0000 mL | Freq: Once | INTRAVENOUS | Status: AC
  Administered 2022-03-25: 10 mL

## 2022-03-25 MED ORDER — HEPARIN SOD (PORK) LOCK FLUSH 100 UNIT/ML IV SOLN
500.0000 [IU] | INTRAVENOUS | Status: AC | PRN
  Administered 2022-03-25: 500 [IU]

## 2022-03-28 ENCOUNTER — Other Ambulatory Visit: Payer: Self-pay | Admitting: Physician Assistant

## 2022-03-28 DIAGNOSIS — L84 Corns and callosities: Secondary | ICD-10-CM

## 2022-03-29 ENCOUNTER — Telehealth: Payer: Self-pay

## 2022-03-29 NOTE — Telephone Encounter (Signed)
Per [place] request:  referral faxed to [facility]  Confirmation received

## 2022-04-04 ENCOUNTER — Telehealth: Payer: Self-pay | Admitting: Hematology

## 2022-04-04 NOTE — Telephone Encounter (Signed)
.  Called patient to schedule appointment per 6/12 inbasket, patient is aware of date and time.

## 2022-04-07 ENCOUNTER — Ambulatory Visit: Payer: Medicare PPO | Admitting: Hematology

## 2022-04-07 ENCOUNTER — Ambulatory Visit: Payer: Medicare PPO

## 2022-04-07 ENCOUNTER — Other Ambulatory Visit: Payer: Medicare PPO

## 2022-04-08 ENCOUNTER — Ambulatory Visit: Payer: Medicare PPO | Admitting: Podiatry

## 2022-04-13 ENCOUNTER — Other Ambulatory Visit: Payer: Self-pay | Admitting: Hematology

## 2022-04-13 MED FILL — Dexamethasone Sodium Phosphate Inj 100 MG/10ML: INTRAMUSCULAR | Qty: 1 | Status: AC

## 2022-04-14 ENCOUNTER — Ambulatory Visit: Payer: Medicare PPO | Admitting: Podiatry

## 2022-04-14 ENCOUNTER — Inpatient Hospital Stay: Payer: Medicare PPO

## 2022-04-14 ENCOUNTER — Other Ambulatory Visit: Payer: Self-pay

## 2022-04-14 ENCOUNTER — Inpatient Hospital Stay (HOSPITAL_BASED_OUTPATIENT_CLINIC_OR_DEPARTMENT_OTHER): Payer: Medicare PPO | Admitting: Hematology

## 2022-04-14 ENCOUNTER — Encounter: Payer: Self-pay | Admitting: Hematology

## 2022-04-14 VITALS — BP 110/77 | HR 58 | Temp 98.1°F | Resp 18 | Ht 69.5 in | Wt 214.1 lb

## 2022-04-14 DIAGNOSIS — N3000 Acute cystitis without hematuria: Secondary | ICD-10-CM | POA: Diagnosis not present

## 2022-04-14 DIAGNOSIS — C182 Malignant neoplasm of ascending colon: Secondary | ICD-10-CM

## 2022-04-14 DIAGNOSIS — Z95828 Presence of other vascular implants and grafts: Secondary | ICD-10-CM

## 2022-04-14 DIAGNOSIS — C19 Malignant neoplasm of rectosigmoid junction: Secondary | ICD-10-CM

## 2022-04-14 DIAGNOSIS — Z5111 Encounter for antineoplastic chemotherapy: Secondary | ICD-10-CM | POA: Diagnosis not present

## 2022-04-14 LAB — URINALYSIS, COMPLETE (UACMP) WITH MICROSCOPIC
Bilirubin Urine: NEGATIVE
Glucose, UA: NEGATIVE mg/dL
Hgb urine dipstick: NEGATIVE
Ketones, ur: NEGATIVE mg/dL
Leukocytes,Ua: NEGATIVE
Nitrite: NEGATIVE
Protein, ur: NEGATIVE mg/dL
Specific Gravity, Urine: 1.02 (ref 1.005–1.030)
pH: 5 (ref 5.0–8.0)

## 2022-04-14 LAB — CMP (CANCER CENTER ONLY)
ALT: 22 U/L (ref 0–44)
AST: 25 U/L (ref 15–41)
Albumin: 3.3 g/dL — ABNORMAL LOW (ref 3.5–5.0)
Alkaline Phosphatase: 84 U/L (ref 38–126)
Anion gap: 4 — ABNORMAL LOW (ref 5–15)
BUN: 19 mg/dL (ref 8–23)
CO2: 31 mmol/L (ref 22–32)
Calcium: 9.4 mg/dL (ref 8.9–10.3)
Chloride: 102 mmol/L (ref 98–111)
Creatinine: 1.15 mg/dL (ref 0.61–1.24)
GFR, Estimated: >60 mL/min
Glucose, Bld: 149 mg/dL — ABNORMAL HIGH (ref 70–99)
Potassium: 3.5 mmol/L (ref 3.5–5.1)
Sodium: 137 mmol/L (ref 135–145)
Total Bilirubin: 0.6 mg/dL (ref 0.3–1.2)
Total Protein: 7.5 g/dL (ref 6.5–8.1)

## 2022-04-14 LAB — CBC WITH DIFFERENTIAL (CANCER CENTER ONLY)
Abs Immature Granulocytes: 0.01 10*3/uL (ref 0.00–0.07)
Basophils Absolute: 0.1 10*3/uL (ref 0.0–0.1)
Basophils Relative: 2 %
Eosinophils Absolute: 0.2 10*3/uL (ref 0.0–0.5)
Eosinophils Relative: 5 %
HCT: 35.2 % — ABNORMAL LOW (ref 39.0–52.0)
Hemoglobin: 11.4 g/dL — ABNORMAL LOW (ref 13.0–17.0)
Immature Granulocytes: 0 %
Lymphocytes Relative: 36 %
Lymphs Abs: 1.5 10*3/uL (ref 0.7–4.0)
MCH: 28.7 pg (ref 26.0–34.0)
MCHC: 32.4 g/dL (ref 30.0–36.0)
MCV: 88.7 fL (ref 80.0–100.0)
Monocytes Absolute: 1 10*3/uL (ref 0.1–1.0)
Monocytes Relative: 24 %
Neutro Abs: 1.4 10*3/uL — ABNORMAL LOW (ref 1.7–7.7)
Neutrophils Relative %: 33 %
Platelet Count: 165 10*3/uL (ref 150–400)
RBC: 3.97 MIL/uL — ABNORMAL LOW (ref 4.22–5.81)
RDW: 14.6 % (ref 11.5–15.5)
WBC Count: 4.1 10*3/uL (ref 4.0–10.5)
nRBC: 0 % (ref 0.0–0.2)

## 2022-04-14 LAB — CEA (IN HOUSE-CHCC): CEA (CHCC-In House): <1 ng/mL (ref 0.00–5.00)

## 2022-04-14 MED ORDER — FLUOROURACIL CHEMO INJECTION 2.5 GM/50ML
400.0000 mg/m2 | Freq: Once | INTRAVENOUS | Status: AC
  Administered 2022-04-14: 900 mg via INTRAVENOUS
  Filled 2022-04-14: qty 18

## 2022-04-14 MED ORDER — DEXTROSE 5 % IV SOLN: Freq: Once | INTRAVENOUS | Status: AC

## 2022-04-14 MED ORDER — LEUCOVORIN CALCIUM INJECTION 350 MG
400.0000 mg/m2 | Freq: Once | INTRAVENOUS | Status: AC
  Administered 2022-04-14: 892 mg via INTRAVENOUS
  Filled 2022-04-14: qty 44.6

## 2022-04-14 MED ORDER — SODIUM CHLORIDE 0.9% FLUSH
10.0000 mL | Freq: Once | INTRAVENOUS | Status: AC
  Administered 2022-04-14: 10 mL

## 2022-04-14 MED ORDER — SODIUM CHLORIDE 0.9 % IV SOLN
2400.0000 mg/m2 | INTRAVENOUS | Status: DC
  Administered 2022-04-14: 5350 mg via INTRAVENOUS
  Filled 2022-04-14: qty 107

## 2022-04-14 MED ORDER — OXALIPLATIN CHEMO INJECTION 100 MG/20ML
85.0000 mg/m2 | Freq: Once | INTRAVENOUS | Status: AC
  Administered 2022-04-14: 190 mg via INTRAVENOUS
  Filled 2022-04-14: qty 38

## 2022-04-14 MED ORDER — SODIUM CHLORIDE 0.9 % IV SOLN
10.0000 mg | Freq: Once | INTRAVENOUS | Status: AC
  Administered 2022-04-14: 10 mg via INTRAVENOUS
  Filled 2022-04-14: qty 10

## 2022-04-14 MED ORDER — PALONOSETRON HCL INJECTION 0.25 MG/5ML
0.2500 mg | Freq: Once | INTRAVENOUS | Status: AC
  Administered 2022-04-14: 0.25 mg via INTRAVENOUS
  Filled 2022-04-14: qty 5

## 2022-04-14 NOTE — Patient Instructions (Signed)
[facility]   Discharge Instructions: Thank you for choosing [facility] to provide your oncology and hematology care.   If you have a lab appointment with the Cancer Center, please go directly to the Cancer Center and check in at the registration area.   Wear comfortable clothing and clothing appropriate for easy access to any Portacath or PICC line.   We strive to give you quality time with your provider. You may need to reschedule your appointment if you arrive late (15 or more minutes).  Arriving late affects you and other patients whose appointments are after yours.  Also, if you miss three or more appointments without notifying the office, you may be dismissed from the clinic at the provider's discretion.      For prescription refill requests, have your pharmacy contact our office and allow 72 hours for refills to be completed.    Today you received the following chemotherapy and/or immunotherapy agents: oxaliplatin, leucovorin, fluorouracil      To help prevent nausea and vomiting after your treatment, we encourage you to take your nausea medication as directed.  BELOW ARE SYMPTOMS THAT SHOULD BE REPORTED IMMEDIATELY: *FEVER GREATER THAN 100.4 F (38 C) OR HIGHER *CHILLS OR SWEATING *NAUSEA AND VOMITING THAT IS NOT CONTROLLED WITH YOUR NAUSEA MEDICATION *UNUSUAL SHORTNESS OF BREATH *UNUSUAL BRUISING OR BLEEDING *URINARY PROBLEMS (pain or burning when urinating, or frequent urination) *BOWEL PROBLEMS (unusual diarrhea, constipation, pain near the anus) TENDERNESS IN MOUTH AND THROAT WITH OR WITHOUT PRESENCE OF ULCERS (sore throat, sores in mouth, or a toothache) UNUSUAL RASH, SWELLING OR PAIN  UNUSUAL VAGINAL DISCHARGE OR ITCHING   Items with * indicate a potential emergency and should be followed up as soon as possible or go to the Emergency Department if any problems should occur.  Please show the CHEMOTHERAPY ALERT CARD or  IMMUNOTHERAPY ALERT CARD at check-in to the Emergency Department and triage nurse.  Should you have questions after your visit or need to cancel or reschedule your appointment, please contact [facility]  Dept: [phone]  and follow the prompts.  Office hours are 8:00 a.m. to 4:30 p.m. Monday - Friday. Please note that voicemails left after 4:00 p.m. may not be returned until the following business day.  We are closed weekends and major holidays. You have access to a nurse at all times for urgent questions. Please call the main number to the clinic Dept: [phone] and follow the prompts.   For any non-urgent questions, you may also contact your provider using MyChart. We now offer e-Visits for anyone 18 and older to request care online for non-urgent symptoms. For details visit [email or web address].   Also download the MyChart app! Go to the app store, search "MyChart", open the app, select [facility], and log in with your MyChart username and password.  Masks are optional in the cancer centers. If you would like for your care team to wear a mask while they are taking care of you, please let them know. For doctor visits, patients may have with them one support person who is at least 70 years old. At this time, visitors are not allowed in the infusion area. 

## 2022-04-14 NOTE — Progress Notes (Signed)
Per Dr Feng, ok to treat today with ANC 1.4. 

## 2022-04-14 NOTE — Progress Notes (Signed)
[name]   [phone] Fax:([phone]) [phone]   Clinic Follow up Note   Patient Care Team: Arthur Holms, NP as PCP - General (Nurse Practitioner) Truitt Merle, MD as Consulting Physician (Oncology)  Date of Service:  04/14/2022  CHIEF COMPLAINT: f/u of colorectal cancer  CURRENT THERAPY:  Adjuvant FOLFOX, started 12/23/21  ASSESSMENT & PLAN:  George Watkins is a 71 y.o. male with   1. Colorectal cancer, adenocarcinoma of the ascending colon, pT3N2aMx, at least stage IIIB, MSS -Colonoscopy performed on 10/13/21 for IDA and abdominal pain showed a 4 cm mass near the hepatic flexure. Pathology confirmed adenocarcinoma, MMR normal. -Baseline CEA was elevated at 25.9 on 10/13/21 -CT C/A/P on 10/21/21 showed: circumferential mass in the mid ascending colon; mildly prominent, asymmetric right external iliac lymph node, nonspecific but suspicious.   -PET scan on 11/09/21 showed: hypermetabolism to colon mass and adjacent mildly enlarged ileocolonic mesenteric lymph nodes. Otherwise, no definitive evidence of additional spread. -partial colectomy on 11/17/21 with Dr. Marcello Moores showed 5.6 cm invasive moderately differentiated adenocarcinoma invading into pericolic adipose. Margins negative, but metastatic carcinoma in 6 lymph nodes (6/15). Two additional tubular adenomas without high-grade dysplasia noted. MMR normal and MSI stable.  -given his multiple (6) positive lymph nodes, he began adjuvant chemotherapy with FOLFOX on 12/23/21. He is tolerating well overall with mild side effects, he is able to recover well, no neuropathy so far  -labs reviewed, overall stable and adequate to proceed with treatment. -repeat CT scan in 3-4 weeks for monitoring   2. Dysuria -he reports new painful urination. -will obtain UA and culture to rule out UTI   3. Symptom Management: Low appetite, Mild neuropathy -Metallic taste is affecting appetite but weight is stable. Encouraged him to supplement  with protein drinks like Ensure. -He reports some mild numbness/tingling in his fingers that is occasional.    4. Iron deficiency anemia  -Patient currently taking ferrous sulfate BID and compliant. Has had good response to oral iron supplement, has not required additional treatment. -Hgb overall stable since surgery, 11.4 today   5. Incidental Scan Findings: Renal Lesion, Left axillary adenopathy -seen on CT CAP 10/21/21  -adenopathy was positive on PET in 10/2021. Biopsy deferred due to normal-appearing nodes. -Plan to monitor on restaging CT 04/2022 and to get renal US after he completes chemo   6. Hypokalemia:   -Currently taking potassium chloride 10 mEq twice daily -Potassium level is 3.4 today, stable.      PLAN: -Proceed with C8 FOLFOX today -lab, flush, f/u, and FOLFOX in 2 and 4 weeks -CT to be done in 3-4 weeks before C10   No problem-specific Assessment & Plan notes found for this encounter.   SUMMARY OF ONCOLOGIC HISTORY: Oncology History Overview Note   Cancer Staging  Colorectal cancer [facility]) Staging form: Colon and Rectum, AJCC 8th Edition - Pathologic stage from 11/17/2021: Stage IIIB (pT3, pN2a, cM0) - Signed by Truitt Merle, MD on 12/03/2021    Cancer of right colon [facility])  10/13/2021 Procedure   Upper GI endoscopy by Dr. Ardis Hughs for IDA and abdominal pain  Findings:  - The esophagus was normal. - The stomach was normal. - The examined duodenum was normal.   10/13/2021 Procedure   Colonoscopy Indications: Iron deficiency  - A fungating and ulcerated partially obstructing, clearly malignant mass was found at the hepatic flexure. The mass was circumferential and measured 4cm in length. Biopsies were taken with a cold forceps for histology and then the mucosa  just distal to the mass was tattooed with an injection of Spot (carbon black). jar 1 Findings: - Two pedunculated polyps were found in the mid transverse colon. The polyps were 8 to 11 mm in size. These  polyps were removed with a hot snare. Resection and retrieval were complete. jar 2 - Five pedunculated and sessile polyps were found in the distal transverse colon. The polyps were 4 to 14 mm in size. Two were removed with hot snare, three were removed with cold snare. Resection and retrieval were complete. jar 3 - A 20 mm polyp was found in the distal transverse colon. The polyp was pedunculated. The polyp was removed with a hot snare. Resection and retrieval were complete. jar 4 - The exam was otherwise without abnormality on direct and retroflexion views.   10/13/2021 Pathology Results   REPORT OF SURGICAL PATHOLOGY Accession: [id number]  Diagnosis 1. Hepatic Flexure Biopsy - ADENOCARCINOMA, SEE NOTE   10/21/2021 Imaging   CT CHEST ABDOMEN PELVIS W CONTRAST  IMPRESSION: 1. There is a circumferential mass of the mid ascending colon measuring approximately 5 cm in length, in keeping with newly diagnosed primary colon malignancy. 2. Adjacent to the colon mass, there is mesenteric fat stranding suggesting invasion beyond the muscularis as well as abnormally enlarged mesocolonic lymph nodes. 3. Mildly prominent, asymmetric right external iliac lymph nodes, nonspecific although suspicious for early nodal metastatic disease. 4. Asymmetrically prominent left axillary lymph nodes, nonspecific, these nodes less favored to reflect metastatic disease given location in the absence of other evidence of metastatic disease above the diaphragm. Both external iliac and axillary lymph nodes could be characterized for abnormal metabolic activity by PET-CT. 5. No evidence of organ metastatic disease in the chest, abdomen, or pelvis. 6. Exophytic soft tissue attenuation lesion of the anterior midportion of the left kidney measuring 1.2 cm. Partially exophytic soft tissue attenuation lesion of the posteroinferior pole of the left kidney measuring 1.9 x 1.7 cm. These may reflect hemorrhagic  or proteinaceous cysts but are incompletely characterized and incidental renal cell carcinoma is not excluded. Recommend initial renal ultrasound to establish definitively cystic character, multiphasic contrast enhanced CT or MRI may however be further required to exclude malignancy. 7. Prostatomegaly with thickening of the decompressed urinary bladder wall, likely due to chronic outlet obstruction.   Aortic Atherosclerosis (ICD10-I70.0).   10/28/2021 Initial Diagnosis   Colorectal cancer ([place])   11/17/2021 Cancer Staging   Staging form: Colon and Rectum, AJCC 8th Edition - Pathologic stage from 11/17/2021: Stage IIIB (pT3, pN2a, cM0) - Signed by Truitt Merle, MD on 12/03/2021 Histologic grading system: 4 grade system Histologic grade (G): G2 Residual tumor (R): R0 - None   11/17/2021 Definitive Surgery   FINAL MICROSCOPIC DIAGNOSIS:   A. COLON, RIGHT, RESECTION:  Invasive moderately differentiated adenocarcinoma invading into  pericolic adipose (pT3)  Tumor measures 5.2 x 3.6 x 0.8 cm  Margins free of carcinoma and adenomatous change  Six of fifteen pericolic lymph nodes with metastatic carcinoma (6/15, pN2a)  Two additional tubular adenomas without high-grade dysplasia, largest measuring 1.3 cm  Benign appendix   ADDENDUM:  Mismatch Repair Protein (IHC)  SUMMARY INTERPRETATION: NORMAL  Microsatellite Instability (MSI) Result: STABLE   12/23/2021 -  Chemotherapy   Patient is on Treatment Plan : COLORECTAL FOLFOX q14d x 6 months        INTERVAL HISTORY:  George Watkins is here for a follow up of colorectal cancer. He was last seen by PA Murray Hodgkins on 03/23/22. He presents  to the clinic alone. He reports he is having some pains-- to his eye, and some dysuria.   All other systems were reviewed with the patient and are negative.  MEDICAL HISTORY:  Past Medical History:  Diagnosis Date   Anemia    Arthritis    Colon cancer ([place])    Coronary artery disease    GERD  (gastroesophageal reflux disease)    Heart murmur    Hiatal hernia    Hypertension    Pneumonia    Sleep apnea     SURGICAL HISTORY: Past Surgical History:  Procedure Laterality Date   ANKLE SURGERY     CIRCUMCISION     COLONOSCOPY     HERNIA REPAIR     PORTACATH PLACEMENT N/A 12/09/2021   Procedure: INSERTION PORT-A-CATH with ultrasound;  Surgeon: Leighton Ruff, MD;  Location: [facility] ORS;  Service: General;  Laterality: N/A;    I have reviewed the social history and family history with the patient and they are unchanged from previous note.  ALLERGIES:  is allergic to sulfa antibiotics.  MEDICATIONS:  Current Outpatient Medications  Medication Sig Dispense Refill   amLODipine (NORVASC) 5 MG tablet Take 5 mg by mouth daily.     atorvastatin (LIPITOR) 10 MG tablet Take 10 mg by mouth at bedtime.     cholecalciferol (VITAMIN D3) 25 MCG (1000 UNIT) tablet Take 1,000 Units by mouth daily. (Patient not taking: Reported on 03/23/2022)     cloNIDine (CATAPRES) 0.2 MG tablet Take 0.2 mg by mouth 2 (two) times daily.     cyclobenzaprine (FLEXERIL) 5 MG tablet Take 5 mg by mouth at bedtime as needed for muscle spasms. (Patient not taking: Reported on 03/23/2022)     ferrous sulfate 325 (65 FE) MG tablet Take 1 tablet (325 mg total) by mouth 2 (two) times daily with a meal. 30 tablet 0   KLOR-CON M10 10 MEQ tablet TAKE 1 TABLET BY MOUTH TWICE A DAY 60 tablet 0   labetalol (NORMODYNE) 200 MG tablet Take 200 mg by mouth 2 (two) times daily.     lidocaine-prilocaine (EMLA) cream Apply to affected area once 30 g 3   losartan-hydrochlorothiazide (HYZAAR) 100-25 MG tablet Take 1 tablet by mouth daily.     Multiple Vitamins-Minerals (MULTIVITAMIN MEN 50+ PO) Take 1 tablet by mouth daily.     ondansetron (ZOFRAN) 8 MG tablet Take 1 tablet (8 mg total) by mouth 2 (two) times daily as needed for refractory nausea / vomiting. Start on day 3 after chemotherapy. 30 tablet 1   pantoprazole (PROTONIX) 40 MG  tablet Take 40 mg by mouth daily. (Patient not taking: Reported on 03/23/2022)     prochlorperazine (COMPAZINE) 10 MG tablet Take 1 tablet (10 mg total) by mouth every 6 (six) hours as needed (Nausea or vomiting). (Patient not taking: Reported on 03/23/2022) 30 tablet 1   traMADol (ULTRAM) 50 MG tablet Take 1-2 tablets (50-100 mg total) by mouth every 6 (six) hours as needed for moderate pain. (Patient not taking: Reported on 03/23/2022) 30 tablet 0   No current facility-administered medications for this visit.    PHYSICAL EXAMINATION: ECOG PERFORMANCE STATUS: 1 - Symptomatic but completely ambulatory  Vitals:   04/14/22 0834  BP: 110/77  Pulse: (!) 58  Resp: 18  Temp: 98.1 F (36.7 C)  SpO2: 97%   Wt Readings from Last 3 Encounters:  04/14/22 214 lb 1.6 oz (97.1 kg)  03/23/22 216 lb 6.4 oz (98.2 kg)  03/10/22 215 lb  12.8 oz (97.9 kg)     GENERAL:alert, no distress and comfortable SKIN: skin color normal, no rashes or significant lesions EYES: normal, Conjunctiva are pink and non-injected, sclera clear  NEURO: alert & oriented x 3 with fluent speech  LABORATORY DATA:  I have reviewed the data as listed    Latest Ref Rng & Units 04/14/2022    8:12 AM 03/23/2022   10:23 AM 03/10/2022    9:37 AM  CBC  WBC 4.0 - 10.5 K/uL 4.1  3.8  4.6   Hemoglobin 13.0 - 17.0 g/dL 11.4  11.1  11.3   Hematocrit 39.0 - 52.0 % 35.2  33.7  35.7   Platelets 150 - 400 K/uL 165  150  192         Latest Ref Rng & Units 03/23/2022   10:23 AM 03/10/2022    9:37 AM 02/17/2022   10:22 AM  CMP  Glucose 70 - 99 mg/dL 129  173  178   BUN 8 - 23 mg/dL _0 Creatinine 0.61 - 1.24 mg/dL 1.26  1.20  1.25   Sodium 135 - 145 mmol/L 139  137  141   Potassium 3.5 - 5.1 mmol/L 3.4  3.4  3.1   Chloride 98 - 111 mmol/L 104  103  103   CO2 22 - 32 mmol/L 31  30  32   Calcium 8.9 - 10.3 mg/dL 9.7  9.5  9.4   Total Protein 6.5 - 8.1 g/dL 7.1  7.4  7.2   Total Bilirubin 0.3 - 1.2 mg/dL 0.8  0.7  1.0    Alkaline Phos 38 - 126 U/L 95  83  81   AST 15 - 41 U/L 26  21  35   ALT 0 - 44 U/L 21  17  37       RADIOGRAPHIC STUDIES: I have personally reviewed the radiological images as listed and agreed with the findings in the report. No results found.    Orders Placed This Encounter  Procedures   Urine Culture    Standing Status:   Future    Number of Occurrences:   1    Standing Expiration Date:   04/15/2023   CT ABDOMEN PELVIS W CONTRAST    Standing Status:   Future    Standing Expiration Date:   04/15/2023    Order Specific Question:   If indicated for the ordered procedure, I authorize the administration of contrast media per Radiology protocol    Answer:   Yes    Order Specific Question:   Preferred imaging location?    Answer:   [facility]    Order Specific Question:   Is Oral Contrast requested for this exam?    Answer:   Yes, Per Radiology protocol   Urinalysis, Complete w Microscopic    Standing Status:   Future    Number of Occurrences:   1    Standing Expiration Date:   04/15/2023   All questions were answered. The patient knows to call the clinic with any problems, questions or concerns. No barriers to learning was detected. The total time spent in the appointment was 30 minutes.     Truitt Merle, MD 04/14/2022   I, Wilburn Mylar, am acting as scribe for Truitt Merle, MD.   I have reviewed the above documentation for accuracy and completeness, and I agree with the above.

## 2022-04-15 LAB — URINE CULTURE: Culture: NO GROWTH

## 2022-04-16 ENCOUNTER — Inpatient Hospital Stay: Payer: Medicare PPO

## 2022-04-16 ENCOUNTER — Other Ambulatory Visit: Payer: Self-pay

## 2022-04-16 VITALS — BP 144/83 | HR 62 | Temp 97.8°F

## 2022-04-16 DIAGNOSIS — Z5111 Encounter for antineoplastic chemotherapy: Secondary | ICD-10-CM | POA: Diagnosis not present

## 2022-04-16 DIAGNOSIS — C182 Malignant neoplasm of ascending colon: Secondary | ICD-10-CM

## 2022-04-16 MED ORDER — HEPARIN SOD (PORK) LOCK FLUSH 100 UNIT/ML IV SOLN
500.0000 [IU] | Freq: Once | INTRAVENOUS | Status: AC | PRN
  Administered 2022-04-16: 500 [IU]

## 2022-04-16 MED ORDER — SODIUM CHLORIDE 0.9% FLUSH
10.0000 mL | INTRAVENOUS | Status: DC | PRN
  Administered 2022-04-16: 10 mL

## 2022-04-18 ENCOUNTER — Telehealth: Payer: Self-pay

## 2022-04-21 ENCOUNTER — Ambulatory Visit: Payer: Medicare PPO | Admitting: Hematology

## 2022-04-21 ENCOUNTER — Ambulatory Visit: Payer: Medicare PPO

## 2022-04-21 ENCOUNTER — Other Ambulatory Visit: Payer: Medicare PPO

## 2022-04-27 ENCOUNTER — Other Ambulatory Visit: Payer: Self-pay | Admitting: Hematology

## 2022-04-27 MED FILL — Dexamethasone Sodium Phosphate Inj 100 MG/10ML: INTRAMUSCULAR | Qty: 1 | Status: AC

## 2022-04-28 ENCOUNTER — Inpatient Hospital Stay: Payer: Medicare PPO | Attending: Physician Assistant

## 2022-04-28 ENCOUNTER — Encounter: Payer: Self-pay | Admitting: Hematology

## 2022-04-28 ENCOUNTER — Inpatient Hospital Stay: Payer: Medicare PPO

## 2022-04-28 ENCOUNTER — Inpatient Hospital Stay (HOSPITAL_BASED_OUTPATIENT_CLINIC_OR_DEPARTMENT_OTHER): Payer: Medicare PPO | Admitting: Hematology

## 2022-04-28 ENCOUNTER — Other Ambulatory Visit: Payer: Self-pay

## 2022-04-28 VITALS — BP 150/93

## 2022-04-28 VITALS — BP 160/98 | HR 66 | Temp 98.2°F | Resp 18 | Ht 69.5 in | Wt 207.5 lb

## 2022-04-28 DIAGNOSIS — D509 Iron deficiency anemia, unspecified: Secondary | ICD-10-CM | POA: Diagnosis not present

## 2022-04-28 DIAGNOSIS — R590 Localized enlarged lymph nodes: Secondary | ICD-10-CM | POA: Insufficient documentation

## 2022-04-28 DIAGNOSIS — I10 Essential (primary) hypertension: Secondary | ICD-10-CM | POA: Diagnosis not present

## 2022-04-28 DIAGNOSIS — G629 Polyneuropathy, unspecified: Secondary | ICD-10-CM | POA: Diagnosis not present

## 2022-04-28 DIAGNOSIS — C19 Malignant neoplasm of rectosigmoid junction: Secondary | ICD-10-CM

## 2022-04-28 DIAGNOSIS — C182 Malignant neoplasm of ascending colon: Secondary | ICD-10-CM

## 2022-04-28 DIAGNOSIS — C779 Secondary and unspecified malignant neoplasm of lymph node, unspecified: Secondary | ICD-10-CM | POA: Insufficient documentation

## 2022-04-28 DIAGNOSIS — Z5111 Encounter for antineoplastic chemotherapy: Secondary | ICD-10-CM | POA: Diagnosis not present

## 2022-04-28 DIAGNOSIS — Z95828 Presence of other vascular implants and grafts: Secondary | ICD-10-CM

## 2022-04-28 DIAGNOSIS — E876 Hypokalemia: Secondary | ICD-10-CM | POA: Insufficient documentation

## 2022-04-28 LAB — CMP (CANCER CENTER ONLY)
ALT: 25 U/L (ref 0–44)
AST: 32 U/L (ref 15–41)
Albumin: 3.7 g/dL (ref 3.5–5.0)
Alkaline Phosphatase: 113 U/L (ref 38–126)
Anion gap: 6 (ref 5–15)
BUN: 13 mg/dL (ref 8–23)
CO2: 31 mmol/L (ref 22–32)
Calcium: 9.4 mg/dL (ref 8.9–10.3)
Chloride: 104 mmol/L (ref 98–111)
Creatinine: 1.14 mg/dL (ref 0.61–1.24)
GFR, Estimated: >60 mL/min (ref >60)
Glucose, Bld: 102 mg/dL — ABNORMAL HIGH (ref 70–99)
Potassium: 3 mmol/L — ABNORMAL LOW (ref 3.5–5.1)
Sodium: 141 mmol/L (ref 135–145)
Total Bilirubin: 0.9 mg/dL (ref 0.3–1.2)
Total Protein: 7.7 g/dL (ref 6.5–8.1)

## 2022-04-28 LAB — CBC WITH DIFFERENTIAL (CANCER CENTER ONLY)
Abs Immature Granulocytes: 0.01 10*3/uL (ref 0.00–0.07)
Basophils Absolute: 0 10*3/uL (ref 0.0–0.1)
Basophils Relative: 1 %
Eosinophils Absolute: 0.1 10*3/uL (ref 0.0–0.5)
Eosinophils Relative: 2 %
HCT: 37.3 % — ABNORMAL LOW (ref 39.0–52.0)
Hemoglobin: 12.2 g/dL — ABNORMAL LOW (ref 13.0–17.0)
Immature Granulocytes: 0 %
Lymphocytes Relative: 42 %
Lymphs Abs: 1.7 10*3/uL (ref 0.7–4.0)
MCH: 28.7 pg (ref 26.0–34.0)
MCHC: 32.7 g/dL (ref 30.0–36.0)
MCV: 87.8 fL (ref 80.0–100.0)
Monocytes Absolute: 0.7 10*3/uL (ref 0.1–1.0)
Monocytes Relative: 18 %
Neutro Abs: 1.5 10*3/uL — ABNORMAL LOW (ref 1.7–7.7)
Neutrophils Relative %: 37 %
Platelet Count: 141 10*3/uL — ABNORMAL LOW (ref 150–400)
RBC: 4.25 MIL/uL (ref 4.22–5.81)
RDW: 14.6 % (ref 11.5–15.5)
WBC Count: 4 10*3/uL (ref 4.0–10.5)
nRBC: 0 % (ref 0.0–0.2)

## 2022-04-28 MED ORDER — PALONOSETRON HCL INJECTION 0.25 MG/5ML
0.2500 mg | Freq: Once | INTRAVENOUS | Status: AC
  Administered 2022-04-28: 0.25 mg via INTRAVENOUS
  Filled 2022-04-28: qty 5

## 2022-04-28 MED ORDER — DEXTROSE 5 % IV SOLN: Freq: Once | INTRAVENOUS | Status: AC

## 2022-04-28 MED ORDER — LEUCOVORIN CALCIUM INJECTION 350 MG
400.0000 mg/m2 | Freq: Once | INTRAVENOUS | Status: AC
  Administered 2022-04-28: 892 mg via INTRAVENOUS
  Filled 2022-04-28: qty 44.6

## 2022-04-28 MED ORDER — OXALIPLATIN CHEMO INJECTION 100 MG/20ML
85.0000 mg/m2 | Freq: Once | INTRAVENOUS | Status: AC
  Administered 2022-04-28: 190 mg via INTRAVENOUS
  Filled 2022-04-28: qty 38

## 2022-04-28 MED ORDER — SODIUM CHLORIDE 0.9 % IV SOLN
2400.0000 mg/m2 | INTRAVENOUS | Status: DC
  Administered 2022-04-28: 5350 mg via INTRAVENOUS
  Filled 2022-04-28: qty 107

## 2022-04-28 MED ORDER — FLUOROURACIL CHEMO INJECTION 2.5 GM/50ML
400.0000 mg/m2 | Freq: Once | INTRAVENOUS | Status: AC
  Administered 2022-04-28: 900 mg via INTRAVENOUS
  Filled 2022-04-28: qty 18

## 2022-04-28 MED ORDER — SODIUM CHLORIDE 0.9% FLUSH
10.0000 mL | Freq: Once | INTRAVENOUS | Status: AC
  Administered 2022-04-28: 10 mL

## 2022-04-28 MED ORDER — SODIUM CHLORIDE 0.9 % IV SOLN
10.0000 mg | Freq: Once | INTRAVENOUS | Status: AC
  Administered 2022-04-28: 10 mg via INTRAVENOUS
  Filled 2022-04-28: qty 10

## 2022-04-28 NOTE — Patient Instructions (Signed)
[facility]COL[facility]  Discharge Instructions: Thank you for choosing [facility] to provide your oncology and hematology care.   If you have a lab appointment with the [facility] [place] [facility] [place], please go directly to the [facility] and check in at the registration area.   Wear comfortable clothing and clothing appropriate for easy access to any Portacath or PICC line.   We strive to give you quality time with your provider. You may need to reschedule your appointment if you arrive late (15 or more minutes).  Arriving late affects you and other patients whose appointments are after yours.  Also, if you miss three or more appointments without notifying the office, you may be dismissed from the clinic at the provider's discretion.      For prescription refill requests, have your pharmacy contact our office and allow 72 hours for refills to be completed.    Today you received the following chemotherapy and/or immunotherapy agents: Oxaliplatin and Leucovorin      To help prevent nausea and vomiting after your treatment, we encourage you to take your nausea medication as directed.  BELOW ARE SYMPTOMS THAT SHOULD BE REPORTED IMMEDIATELY: *FEVER GREATER THAN 100.4 F (38 C) OR HIGHER *CHILLS OR SWEATING *NAUSEA AND VOMITING THAT IS NOT CONTROLLED WITH YOUR NAUSEA MEDICATION *UNUSUAL SHORTNESS OF BREATH *UNUSUAL BRUISING OR BLEEDING *URINARY PROBLEMS (pain or burning when urinating, or frequent urination) *BOWEL PROBLEMS (unusual diarrhea, constipation, pain near the anus) TENDERNESS IN MOUTH AND THROAT WITH OR WITHOUT PRESENCE OF ULCERS (sore throat, sores in mouth, or a toothache) UNUSUAL RASH, SWELLING OR PAIN  UNUSUAL VAGINAL DISCHARGE OR ITCHING   Items with * indicate a potential emergency and should be followed up as soon as possible or go to the Emergency Department if any problems should occur.  Please show the CHEMOTHERAPY ALERT CARD or IMMUNOTHERAPY ALERT  CARD at check-in to the Emergency Department and triage nurse.  Should you have questions after your visit or need to cancel or reschedule your appointment, please contact [facility]  Dept: [phone]  and follow the prompts.  Office hours are 8:00 a.m. to 4:30 p.m. Monday - Friday. Please note that voicemails left after 4:00 p.m. may not be returned until the following business day.  We are closed weekends and major holidays. You have access to a nurse at all times for urgent questions. Please call the main number to the clinic Dept: [phone] and follow the prompts.   For any non-urgent questions, you may also contact your provider using MyChart. We now offer e-Visits for anyone 67 and older to request care online for non-urgent symptoms. For details visit [email or web address].   Also download the MyChart app! Go to the app store, search "MyChart", open the app, select [facility], and log in with your MyChart username and password.  Masks are optional in the cancer centers. If you would like for your care team to wear a mask while they are taking care of you, please let them know. For doctor visits, patients may have with them one support person who is at least 70 years old. At this time, visitors are not allowed in the infusion area.

## 2022-04-28 NOTE — Progress Notes (Signed)
[facility]   [phone] Fax:([phone]   Clinic Follow up Note   Patient Care Team: Arthur Holms, NP as PCP - General (Nurse Practitioner) Truitt Merle, MD as Consulting Physician (Oncology)  Date of Service:  04/28/2022  CHIEF COMPLAINT: f/u of colorectal cancer  CURRENT THERAPY:  Adjuvant FOLFOX, started 12/23/21  ASSESSMENT & PLAN:  George Watkins is a 71 y.o. male with   1. Colorectal cancer, adenocarcinoma of the ascending colon, pT3N2aMx, at least stage IIIB, MSS -Colonoscopy performed on 10/13/21 for IDA and abdominal pain showed a 4 cm mass near the hepatic flexure. Pathology confirmed adenocarcinoma, MMR normal. -Baseline CEA was elevated at 25.9 on 10/13/21 -CT C/A/P on 10/21/21 showed: circumferential mass in the mid ascending colon; mildly prominent, asymmetric right external iliac lymph node, nonspecific but suspicious.   -PET scan on 11/09/21 showed: hypermetabolism to colon mass and adjacent mildly enlarged ileocolonic mesenteric lymph nodes. Otherwise, no definitive evidence of additional spread. -partial colectomy on 11/17/21 with Dr. Marcello Moores showed 5.6 cm invasive moderately differentiated adenocarcinoma invading into pericolic adipose. Margins negative, but metastatic carcinoma in 6 lymph nodes (6/15). Two additional tubular adenomas without high-grade dysplasia noted. MMR normal and MSI stable.  -given his multiple (6) positive lymph nodes, he began adjuvant chemotherapy with FOLFOX on 12/23/21. He is tolerating well overall with mild side effects, he is able to recover well. -labs reviewed, overall stable and adequate to proceed with treatment. -he notes he is scheduled for vacation soon. We will postpone his next treatment by a week. -repeat CT scan in 3-4 weeks for monitoring    2. Symptom Management: Low appetite, Mild neuropathy -Metallic taste is affecting appetite, and his weight is down some. Encouraged him to supplement with protein  drinks like Ensure. -He reports some mild numbness/tingling in his fingers that is occasional.    3. Iron deficiency anemia  -currently taking ferrous sulfate BID, has had good response. -Hgb overall stable since surgery, up to 12.2 today (04/28/22)   4. Incidental Scan Findings: Renal Lesion, Left axillary adenopathy -seen on CT CAP 10/21/21  -adenopathy was positive on PET in 10/2021. Biopsy deferred due to normal-appearing nodes. -Plan to monitor on restaging CT 04/2022 and to get renal US after he completes chemo   5. Hypokalemia:   -Currently taking potassium chloride 10 mEq twice daily      PLAN: -Proceed with C9 FOLFOX today -lab, flush, f/u, and FOLFOX in 3 weeks (postpone due to his fatigue and weight loss) -CT to be done in 2-3 weeks before C10   No problem-specific Assessment & Plan notes found for this encounter.   SUMMARY OF ONCOLOGIC HISTORY: Oncology History Overview Note   Cancer Staging  Colorectal cancer [facility]) Staging form: Colon and Rectum, AJCC 8th Edition - Pathologic stage from 11/17/2021: Stage IIIB (pT3, pN2a, cM0) - Signed by Truitt Merle, MD on 12/03/2021    Cancer of right colon [facility])  10/13/2021 Procedure   Upper GI endoscopy by Dr. Ardis Hughs for IDA and abdominal pain  Findings:  - The esophagus was normal. - The stomach was normal. - The examined duodenum was normal.   10/13/2021 Procedure   Colonoscopy Indications: Iron deficiency  - A fungating and ulcerated partially obstructing, clearly malignant mass was found at the hepatic flexure. The mass was circumferential and measured 4cm in length. Biopsies were taken with a cold forceps for histology and then the mucosa just distal to the mass was tattooed with an injection of Spot (  carbon black). jar 1 Findings: - Two pedunculated polyps were found in the mid transverse colon. The polyps were 8 to 11 mm in size. These polyps were removed with a hot snare. Resection and retrieval were complete. jar  2 - Five pedunculated and sessile polyps were found in the distal transverse colon. The polyps were 4 to 14 mm in size. Two were removed with hot snare, three were removed with cold snare. Resection and retrieval were complete. jar 3 - A 20 mm polyp was found in the distal transverse colon. The polyp was pedunculated. The polyp was removed with a hot snare. Resection and retrieval were complete. jar 4 - The exam was otherwise without abnormality on direct and retroflexion views.   10/13/2021 Pathology Results   REPORT OF SURGICAL PATHOLOGY Accession: [id number]  Diagnosis 1. Hepatic Flexure Biopsy - ADENOCARCINOMA, SEE NOTE   10/21/2021 Imaging   CT CHEST ABDOMEN PELVIS W CONTRAST  IMPRESSION: 1. There is a circumferential mass of the mid ascending colon measuring approximately 5 cm in length, in keeping with newly diagnosed primary colon malignancy. 2. Adjacent to the colon mass, there is mesenteric fat stranding suggesting invasion beyond the muscularis as well as abnormally enlarged mesocolonic lymph nodes. 3. Mildly prominent, asymmetric right external iliac lymph nodes, nonspecific although suspicious for early nodal metastatic disease. 4. Asymmetrically prominent left axillary lymph nodes, nonspecific, these nodes less favored to reflect metastatic disease given location in the absence of other evidence of metastatic disease above the diaphragm. Both external iliac and axillary lymph nodes could be characterized for abnormal metabolic activity by PET-CT. 5. No evidence of organ metastatic disease in the chest, abdomen, or pelvis. 6. Exophytic soft tissue attenuation lesion of the anterior midportion of the left kidney measuring 1.2 cm. Partially exophytic soft tissue attenuation lesion of the posteroinferior pole of the left kidney measuring 1.9 x 1.7 cm. These may reflect hemorrhagic or proteinaceous cysts but are incompletely characterized and incidental renal cell  carcinoma is not excluded. Recommend initial renal ultrasound to establish definitively cystic character, multiphasic contrast enhanced CT or MRI may however be further required to exclude malignancy. 7. Prostatomegaly with thickening of the decompressed urinary bladder wall, likely due to chronic outlet obstruction.   Aortic Atherosclerosis (ICD10-I70.0).   10/28/2021 Initial Diagnosis   Colorectal cancer ([place])   11/17/2021 Cancer Staging   Staging form: Colon and Rectum, AJCC 8th Edition - Pathologic stage from 11/17/2021: Stage IIIB (pT3, pN2a, cM0) - Signed by Truitt Merle, MD on 12/03/2021 Histologic grading system: 4 grade system Histologic grade (G): G2 Residual tumor (R): R0 - None   11/17/2021 Definitive Surgery   FINAL MICROSCOPIC DIAGNOSIS:   A. COLON, RIGHT, RESECTION:  Invasive moderately differentiated adenocarcinoma invading into  pericolic adipose (pT3)  Tumor measures 5.2 x 3.6 x 0.8 cm  Margins free of carcinoma and adenomatous change  Six of fifteen pericolic lymph nodes with metastatic carcinoma (6/15, pN2a)  Two additional tubular adenomas without high-grade dysplasia, largest measuring 1.3 cm  Benign appendix   ADDENDUM:  Mismatch Repair Protein (IHC)  SUMMARY INTERPRETATION: NORMAL  Microsatellite Instability (MSI) Result: STABLE   12/23/2021 -  Chemotherapy   Patient is on Treatment Plan : COLORECTAL FOLFOX q14d x 6 months        INTERVAL HISTORY:  George Watkins is here for a follow up of colorectal cancer. He was last seen by me on 04/14/22. He presents to the clinic alone. He reports he is stable overall. 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 He tells me he is planning on going on vacation this month.   All other systems were reviewed with the patient and are negative.  MEDICAL HISTORY:  Past Medical History:  Diagnosis Date   Anemia    Arthritis    Colon cancer ([place])    Coronary artery disease    GERD (gastroesophageal reflux disease)     Heart murmur    Hiatal hernia    Hypertension    Pneumonia    Sleep apnea     SURGICAL HISTORY: Past Surgical History:  Procedure Laterality Date   ANKLE SURGERY     CIRCUMCISION     COLONOSCOPY     HERNIA REPAIR     PORTACATH PLACEMENT N/A 12/09/2021   Procedure: INSERTION PORT-A-CATH with ultrasound;  Surgeon: Leighton Ruff, MD;  Location: [facility]S;  Service: General;  Laterality: N/A;    I have reviewed the social history and family history with the patient and they are unchanged from previous note.  ALLERGIES:  is allergic to sulfa antibiotics.  MEDICATIONS:  Current Outpatient Medications  Medication Sig Dispense Refill   amLODipine (NORVASC) 5 MG tablet Take 5 mg by mouth daily.     atorvastatin (LIPITOR) 10 MG tablet Take 10 mg by mouth at bedtime.     cholecalciferol (VITAMIN D3) 25 MCG (1000 UNIT) tablet Take 1,000 Units by mouth daily. (Patient not taking: Reported on 03/23/2022)     cloNIDine (CATAPRES) 0.2 MG tablet Take 0.2 mg by mouth 2 (two) times daily.     cyclobenzaprine (FLEXERIL) 5 MG tablet Take 5 mg by mouth at bedtime as needed for muscle spasms. (Patient not taking: Reported on 03/23/2022)     ferrous sulfate 325 (65 FE) MG tablet Take 1 tablet (325 mg total) by mouth 2 (two) times daily with a meal. 30 tablet 0   hydrochlorothiazide (HYDRODIURIL) 12.5 MG tablet Take 1 tablet by mouth daily.     KLOR-CON M10 10 MEQ tablet TAKE 1 TABLET BY MOUTH TWICE A DAY 60 tablet 0   labetalol (NORMODYNE) 200 MG tablet Take 200 mg by mouth 2 (two) times daily.     lidocaine-prilocaine (EMLA) cream Apply to affected area once 30 g 3   losartan (COZAAR) 100 MG tablet Take 0.5 tablets by mouth daily.     losartan-hydrochlorothiazide (HYZAAR) 100-25 MG tablet Take 1 tablet by mouth daily.     Multiple Vitamins-Minerals (MULTIVITAMIN MEN 50+ PO) Take 1 tablet by mouth daily.     ondansetron (ZOFRAN) 8 MG tablet Take 1 tablet (8 mg total) by mouth 2 (two) times daily as  needed for refractory nausea / vomiting. Start on day 3 after chemotherapy. 30 tablet 1   pantoprazole (PROTONIX) 40 MG tablet Take 40 mg by mouth daily. (Patient not taking: Reported on 03/23/2022)     prochlorperazine (COMPAZINE) 10 MG tablet Take 1 tablet (10 mg total) by mouth every 6 (six) hours as needed (Nausea or vomiting). (Patient not taking: Reported on 03/23/2022) 30 tablet 1   traMADol (ULTRAM) 50 MG tablet Take 1-2 tablets (50-100 mg total) by mouth every 6 (six) hours as needed for moderate pain. (Patient not taking: Reported on 03/23/2022) 30 tablet 0   No current facility-administered medications for this visit.    PHYSICAL EXAMINATION: ECOG PERFORMANCE STATUS: 1 - Symptomatic but completely ambulatory  Vitals:   04/28/22 1047  BP: (!) 160/98  Pulse: 66  Resp: 18  Temp: 98.2 F (36.8  C)  SpO2: 100%   Wt Readings from Last 3 Encounters:  04/28/22 207 lb 8 oz (94.1 kg)  04/14/22 214 lb 1.6 oz (97.1 kg)  03/23/22 216 lb 6.4 oz (98.2 kg)     GENERAL:alert, no distress and comfortable SKIN: skin color normal, no rashes or significant lesions EYES: normal, Conjunctiva are pink and non-injected, sclera clear  NEURO: alert & oriented x 3 with fluent speech  LABORATORY DATA:  I have reviewed the data as listed    Latest Ref Rng & Units 04/28/2022   10:23 AM 04/14/2022    8:12 AM 03/23/2022   10:23 AM  CBC  WBC 4.0 - 10.5 K/uL 4.0  4.1  3.8   Hemoglobin 13.0 - 17.0 g/dL 12.2  11.4  11.1   Hematocrit 39.0 - 52.0 % 37.3  35.2  33.7   Platelets 150 - 400 K/uL 141  165  150         Latest Ref Rng & Units 04/28/2022   10:23 AM 04/14/2022    8:12 AM 03/23/2022   10:23 AM  CMP  Glucose 70 - 99 mg/dL 102  149  129   BUN 8 - 23 mg/dL _0 Creatinine 0.61 - 1.24 mg/dL 1.14  1.15  1.26   Sodium 135 - 145 mmol/L 141  137  139   Potassium 3.5 - 5.1 mmol/L 3.0  3.5  3.4   Chloride 98 - 111 mmol/L 104  102  104   CO2 22 - 32 mmol/L _1 Calcium 8.9 - 10.3 mg/dL  9.4  9.4  9.7   Total Protein 6.5 - 8.1 g/dL 7.7  7.5  7.1   Total Bilirubin 0.3 - 1.2 mg/dL 0.9  0.6  0.8   Alkaline Phos 38 - 126 U/L 113  84  95   AST 15 - 41 U/L 32  25  26   ALT 0 - 44 U/L _2 RADIOGRAPHIC STUDIES: I have personally reviewed the radiological images as listed and agreed with the findings in the report. No results found.    No orders of the defined types were placed in this encounter.  All questions were answered. The patient knows to call the clinic with any problems, questions or concerns. No barriers to learning was detected. The total time spent in the appointment was 30 minutes.     Truitt Merle, MD 04/28/2022   I, Wilburn Mylar, am acting as scribe for Truitt Merle, MD.   I have reviewed the above documentation for accuracy and completeness, and I agree with the above.

## 2022-04-30 ENCOUNTER — Inpatient Hospital Stay: Payer: Medicare PPO

## 2022-04-30 VITALS — BP 98/69 | HR 72 | Temp 98.1°F | Resp 17 | Ht 69.5 in

## 2022-04-30 DIAGNOSIS — C182 Malignant neoplasm of ascending colon: Secondary | ICD-10-CM

## 2022-04-30 DIAGNOSIS — Z5111 Encounter for antineoplastic chemotherapy: Secondary | ICD-10-CM | POA: Diagnosis not present

## 2022-04-30 MED ORDER — HEPARIN SOD (PORK) LOCK FLUSH 100 UNIT/ML IV SOLN
500.0000 [IU] | Freq: Once | INTRAVENOUS | Status: AC | PRN
  Administered 2022-04-30: 500 [IU]

## 2022-04-30 MED ORDER — SODIUM CHLORIDE 0.9% FLUSH
10.0000 mL | INTRAVENOUS | Status: DC | PRN
  Administered 2022-04-30: 10 mL

## 2022-04-30 NOTE — Patient Instructions (Signed)

## 2022-05-02 ENCOUNTER — Telehealth: Payer: Self-pay | Admitting: Hematology

## 2022-05-02 NOTE — Telephone Encounter (Signed)
Rescheduled upcoming appointment per 7/6 los. Patient is aware of changes. Mailed calendar.

## 2022-05-05 ENCOUNTER — Other Ambulatory Visit: Payer: Medicare PPO

## 2022-05-05 ENCOUNTER — Ambulatory Visit: Payer: Medicare PPO | Admitting: Hematology

## 2022-05-05 ENCOUNTER — Ambulatory Visit: Payer: Medicare PPO

## 2022-05-12 ENCOUNTER — Ambulatory Visit: Payer: Medicare PPO

## 2022-05-12 ENCOUNTER — Ambulatory Visit: Payer: Medicare PPO | Admitting: Hematology

## 2022-05-12 ENCOUNTER — Other Ambulatory Visit: Payer: Medicare PPO

## 2022-05-16 ENCOUNTER — Other Ambulatory Visit: Payer: Self-pay

## 2022-05-17 ENCOUNTER — Telehealth: Payer: Self-pay | Admitting: Hematology

## 2022-05-17 NOTE — Telephone Encounter (Signed)
.  Called patient to schedule appointment per 7/25 inbasket, patient is aware of date and time.   

## 2022-05-18 MED FILL — Dexamethasone Sodium Phosphate Inj 100 MG/10ML: INTRAMUSCULAR | Qty: 1 | Status: AC

## 2022-05-19 ENCOUNTER — Other Ambulatory Visit: Payer: Self-pay

## 2022-05-19 ENCOUNTER — Inpatient Hospital Stay: Payer: Medicare PPO

## 2022-05-19 ENCOUNTER — Encounter: Payer: Self-pay | Admitting: Hematology

## 2022-05-19 ENCOUNTER — Inpatient Hospital Stay (HOSPITAL_BASED_OUTPATIENT_CLINIC_OR_DEPARTMENT_OTHER): Payer: Medicare PPO | Admitting: Hematology

## 2022-05-19 VITALS — BP 112/72 | HR 52 | Temp 98.2°F | Resp 17 | Wt 212.0 lb

## 2022-05-19 DIAGNOSIS — Z5111 Encounter for antineoplastic chemotherapy: Secondary | ICD-10-CM | POA: Diagnosis not present

## 2022-05-19 DIAGNOSIS — C182 Malignant neoplasm of ascending colon: Secondary | ICD-10-CM

## 2022-05-19 DIAGNOSIS — C19 Malignant neoplasm of rectosigmoid junction: Secondary | ICD-10-CM

## 2022-05-19 DIAGNOSIS — Z95828 Presence of other vascular implants and grafts: Secondary | ICD-10-CM

## 2022-05-19 LAB — CBC WITH DIFFERENTIAL (CANCER CENTER ONLY)
Abs Immature Granulocytes: 0.05 10*3/uL (ref 0.00–0.07)
Basophils Absolute: 0.1 10*3/uL (ref 0.0–0.1)
Basophils Relative: 2 %
Eosinophils Absolute: 0.2 10*3/uL (ref 0.0–0.5)
Eosinophils Relative: 4 %
HCT: 33.3 % — ABNORMAL LOW (ref 39.0–52.0)
Hemoglobin: 10.8 g/dL — ABNORMAL LOW (ref 13.0–17.0)
Immature Granulocytes: 1 %
Lymphocytes Relative: 40 %
Lymphs Abs: 1.7 10*3/uL (ref 0.7–4.0)
MCH: 29.2 pg (ref 26.0–34.0)
MCHC: 32.4 g/dL (ref 30.0–36.0)
MCV: 90 fL (ref 80.0–100.0)
Monocytes Absolute: 1 10*3/uL (ref 0.1–1.0)
Monocytes Relative: 25 %
Neutro Abs: 1.2 10*3/uL — ABNORMAL LOW (ref 1.7–7.7)
Neutrophils Relative %: 28 %
Platelet Count: 145 10*3/uL — ABNORMAL LOW (ref 150–400)
RBC: 3.7 MIL/uL — ABNORMAL LOW (ref 4.22–5.81)
RDW: 14.7 % (ref 11.5–15.5)
WBC Count: 4.2 10*3/uL (ref 4.0–10.5)
nRBC: 0 % (ref 0.0–0.2)

## 2022-05-19 LAB — CMP (CANCER CENTER ONLY)
ALT: 22 U/L (ref 0–44)
AST: 28 U/L (ref 15–41)
Albumin: 3.4 g/dL — ABNORMAL LOW (ref 3.5–5.0)
Alkaline Phosphatase: 128 U/L — ABNORMAL HIGH (ref 38–126)
Anion gap: 4 — ABNORMAL LOW (ref 5–15)
BUN: 10 mg/dL (ref 8–23)
CO2: 30 mmol/L (ref 22–32)
Calcium: 9 mg/dL (ref 8.9–10.3)
Chloride: 103 mmol/L (ref 98–111)
Creatinine: 1.21 mg/dL (ref 0.61–1.24)
GFR, Estimated: >60 mL/min (ref >60)
Glucose, Bld: 151 mg/dL — ABNORMAL HIGH (ref 70–99)
Potassium: 3.3 mmol/L — ABNORMAL LOW (ref 3.5–5.1)
Sodium: 137 mmol/L (ref 135–145)
Total Bilirubin: 0.7 mg/dL (ref 0.3–1.2)
Total Protein: 7 g/dL (ref 6.5–8.1)

## 2022-05-19 LAB — CEA (IN HOUSE-CHCC): CEA (CHCC-In House): 1.09 ng/mL (ref 0.00–5.00)

## 2022-05-19 MED ORDER — DEXTROSE 5 % IV SOLN: Freq: Once | INTRAVENOUS | Status: AC

## 2022-05-19 MED ORDER — SODIUM CHLORIDE 0.9% FLUSH
10.0000 mL | Freq: Once | INTRAVENOUS | Status: AC
  Administered 2022-05-19: 10 mL

## 2022-05-19 MED ORDER — LEUCOVORIN CALCIUM INJECTION 350 MG
400.0000 mg/m2 | Freq: Once | INTRAVENOUS | Status: AC
  Administered 2022-05-19: 892 mg via INTRAVENOUS
  Filled 2022-05-19: qty 44.6

## 2022-05-19 MED ORDER — OXALIPLATIN CHEMO INJECTION 100 MG/20ML
70.0000 mg/m2 | Freq: Once | INTRAVENOUS | Status: AC
  Administered 2022-05-19: 150 mg via INTRAVENOUS
  Filled 2022-05-19: qty 30

## 2022-05-19 MED ORDER — PALONOSETRON HCL INJECTION 0.25 MG/5ML
0.2500 mg | Freq: Once | INTRAVENOUS | Status: AC
  Administered 2022-05-19: 0.25 mg via INTRAVENOUS
  Filled 2022-05-19: qty 5

## 2022-05-19 MED ORDER — OXALIPLATIN CHEMO INJECTION 100 MG/20ML
85.0000 mg/m2 | Freq: Once | INTRAVENOUS | Status: DC
  Filled 2022-05-19: qty 38

## 2022-05-19 MED ORDER — SODIUM CHLORIDE 0.9 % IV SOLN
10.0000 mg | Freq: Once | INTRAVENOUS | Status: AC
  Administered 2022-05-19: 10 mg via INTRAVENOUS
  Filled 2022-05-19: qty 10

## 2022-05-19 MED ORDER — SODIUM CHLORIDE 0.9 % IV SOLN
2400.0000 mg/m2 | INTRAVENOUS | Status: DC
  Administered 2022-05-19: 5350 mg via INTRAVENOUS
  Filled 2022-05-19: qty 107

## 2022-05-19 MED ORDER — FLUOROURACIL CHEMO INJECTION 2.5 GM/50ML
400.0000 mg/m2 | Freq: Once | INTRAVENOUS | Status: AC
  Administered 2022-05-19: 900 mg via INTRAVENOUS
  Filled 2022-05-19: qty 18

## 2022-05-19 NOTE — Patient Instructions (Signed)
[facility]  Discharge Instructions: Thank you for choosing [facility] to provide your oncology and hematology care.   If you have a lab appointment with the [facility], please go directly to the [facility] and check in at the registration area.   Wear comfortable clothing and clothing appropriate for easy access to any Portacath or PICC line.   We strive to give you quality time with your provider. You may need to reschedule your appointment if you arrive late (15 or more minutes).  Arriving late affects you and other patients whose appointments are after yours.  Also, if you miss three or more appointments without notifying the office, you may be dismissed from the clinic at the provider's discretion.      For prescription refill requests, have your pharmacy contact our office and allow 72 hours for refills to be completed.    Today you received the following chemotherapy and/or immunotherapy agents: Oxaliplatin and Leucovorin. 5FU   To help prevent nausea and vomiting after your treatment, we encourage you to take your nausea medication as directed.  BELOW ARE SYMPTOMS THAT SHOULD BE REPORTED IMMEDIATELY: *FEVER GREATER THAN 100.4 F (38 C) OR HIGHER *CHILLS OR SWEATING *NAUSEA AND VOMITING THAT IS NOT CONTROLLED WITH YOUR NAUSEA MEDICATION *UNUSUAL SHORTNESS OF BREATH *UNUSUAL BRUISING OR BLEEDING *URINARY PROBLEMS (pain or burning when urinating, or frequent urination) *BOWEL PROBLEMS (unusual diarrhea, constipation, pain near the anus) TENDERNESS IN MOUTH AND THROAT WITH OR WITHOUT PRESENCE OF ULCERS (sore throat, sores in mouth, or a toothache) UNUSUAL RASH, SWELLING OR PAIN  UNUSUAL VAGINAL DISCHARGE OR ITCHING   Items with * indicate a potential emergency and should be followed up as soon as possible or go to the Emergency Department if any problems should occur.  Please show the CHEMOTHERAPY ALERT CARD or IMMUNOTHERAPY ALERT  CARD at check-in to the Emergency Department and triage nurse.  Should you have questions after your visit or need to cancel or reschedule your appointment, please contact [facility]  Dept: [phone]  and follow the prompts.  Office hours are 8:00 a.m. to 4:30 p.m. Monday - Friday. Please note that voicemails left after 4:00 p.m. may not be returned until the following business day.  We are closed weekends and major holidays. You have access to a nurse at all times for urgent questions. Please call the main number to the clinic Dept: [phone] and follow the prompts.   For any non-urgent questions, you may also contact your provider using MyChart. We now offer e-Visits for anyone 11 and older to request care online for non-urgent symptoms. For details visit [email or web address].   Also download the MyChart app! Go to the app store, search "MyChart", open the app, select , and log in with your MyChart username and password.  Masks are optional in the cancer centers. If you would like for your care team to wear a mask while they are taking care of you, please let them know. For doctor visits, patients may have with them one support person who is at least 70 years old. At this time, visitors are not allowed in the infusion area.

## 2022-05-19 NOTE — Progress Notes (Signed)
Ok to treat with anc of 1.2 per Dr. Burr Medico

## 2022-05-19 NOTE — Progress Notes (Signed)
Per Dr. Burr Medico, reduce Oxaliplatin to 70 mg/m2 today and going forward.  Kennith Center, Pharm.D., CPP 05/19/2022'@11'$ :36 AM

## 2022-05-19 NOTE — Progress Notes (Signed)
[place]   [phone] [phone]   Clinic Follow up Note   Patient Care Team: Arthur Holms, NP as PCP - General (Nurse Practitioner) Truitt Merle, MD as Consulting Physician (Oncology)  Date of Service:  05/19/2022  CHIEF COMPLAINT: f/u of colorectal cancer  CURRENT THERAPY:  Adjuvant FOLFOX, started 12/23/21  ASSESSMENT & PLAN:  George Watkins is a 71 y.o. male with   1. Colorectal cancer, adenocarcinoma of the ascending colon, pT3N2aMx, at least stage IIIB, MSS -Colonoscopy performed on 10/13/21 for IDA and abdominal pain showed a 4 cm mass near the hepatic flexure. Pathology confirmed adenocarcinoma, MMR normal. -Baseline CEA was elevated at 25.9 on 10/13/21 -CT C/A/P on 10/21/21 showed: circumferential mass in the mid ascending colon; mildly prominent, asymmetric right external iliac lymph node, nonspecific but suspicious.   -PET scan on 11/09/21 showed: hypermetabolism to colon mass and adjacent mildly enlarged ileocolonic mesenteric lymph nodes. Otherwise, no definitive evidence of additional spread. -partial colectomy on 11/17/21 with Dr. Marcello Moores showed 5.6 cm invasive moderately differentiated adenocarcinoma invading into pericolic adipose. Margins negative, but metastatic carcinoma in 6 lymph nodes (6/15). Two additional tubular adenomas without high-grade dysplasia noted. MMR normal and MSI stable.  -given his multiple (6) positive lymph nodes, he began adjuvant chemotherapy with FOLFOX on 12/23/21. He is tolerating well overall with mild side effects, he is able to recover well. -labs reviewed, overall stable and adequate to proceed with treatment but will decrease his dose. His potassium is improved but remains low at 3.3. He endorses taking KCL; I advised him to increase to three with each meal. -repeat CT scheduled for 8/3 (delayed as pt was still on vacation)   2. Symptom Management: Low appetite, Mild neuropathy -Metallic taste is affecting  appetite, and his weight is down some. Encouraged him to supplement with protein drinks like Ensure. -He reports some mild numbness/tingling in his fingers that is occasional.    3. Iron deficiency anemia  -currently taking ferrous sulfate BID, has had good response. -Hgb down to 10.8 today (05/19/22)   4. Incidental Scan Findings: Renal Lesion, Left axillary adenopathy -seen on CT CAP 10/21/21  -adenopathy was positive on PET in 10/2021. Biopsy deferred due to normal-appearing nodes. -Plan to monitor on restaging CT 05/2022 and to get renal US after he completes chemo   5. Hypokalemia:   -Currently taking potassium chloride 10 mEq twice daily      PLAN: -Proceed with C10 FOLFOX today, will decrease his oxali dose to 9m/m2  -CT scan 8/3, will call her with results  -lab, flush, f/u, and FOLFOX in 2 weeks   No problem-specific Assessment & Plan notes found for this encounter.   SUMMARY OF ONCOLOGIC HISTORY: Oncology History Overview Note   Cancer Staging  Colorectal cancer ([facility] Staging form: Colon and Rectum, AJCC 8th Edition - Pathologic stage from 11/17/2021: Stage IIIB (pT3, pN2a, cM0) - Signed by FTruitt Merle MD on 12/03/2021    Cancer of right colon ([facility]  10/13/2021 Procedure   Upper GI endoscopy by Dr. JArdis Hughsfor IDA and abdominal pain  Findings:  - The esophagus was normal. - The stomach was normal. - The examined duodenum was normal.   10/13/2021 Procedure   Colonoscopy Indications: Iron deficiency  - A fungating and ulcerated partially obstructing, clearly malignant mass was found at the hepatic flexure. The mass was circumferential and measured 4cm in length. Biopsies were taken with a cold forceps for histology and then the mucosa just distal  to the mass was tattooed with an injection of Spot (carbon black). jar 1 Findings: - Two pedunculated polyps were found in the mid transverse colon. The polyps were 8 to 11 mm in size. These polyps were removed with a  hot snare. Resection and retrieval were complete. jar 2 - Five pedunculated and sessile polyps were found in the distal transverse colon. The polyps were 4 to 14 mm in size. Two were removed with hot snare, three were removed with cold snare. Resection and retrieval were complete. jar 3 - A 20 mm polyp was found in the distal transverse colon. The polyp was pedunculated. The polyp was removed with a hot snare. Resection and retrieval were complete. jar 4 - The exam was otherwise without abnormality on direct and retroflexion views.   10/13/2021 Pathology Results   REPORT OF SURGICAL PATHOLOGY Accession: [id number]  Diagnosis 1. Hepatic Flexure Biopsy - ADENOCARCINOMA, SEE NOTE   10/21/2021 Imaging   CT CHEST ABDOMEN PELVIS W CONTRAST  IMPRESSION: 1. There is a circumferential mass of the mid ascending colon measuring approximately 5 cm in length, in keeping with newly diagnosed primary colon malignancy. 2. Adjacent to the colon mass, there is mesenteric fat stranding suggesting invasion beyond the muscularis as well as abnormally enlarged mesocolonic lymph nodes. 3. Mildly prominent, asymmetric right external iliac lymph nodes, nonspecific although suspicious for early nodal metastatic disease. 4. Asymmetrically prominent left axillary lymph nodes, nonspecific, these nodes less favored to reflect metastatic disease given location in the absence of other evidence of metastatic disease above the diaphragm. Both external iliac and axillary lymph nodes could be characterized for abnormal metabolic activity by PET-CT. 5. No evidence of organ metastatic disease in the chest, abdomen, or pelvis. 6. Exophytic soft tissue attenuation lesion of the anterior midportion of the left kidney measuring 1.2 cm. Partially exophytic soft tissue attenuation lesion of the posteroinferior pole of the left kidney measuring 1.9 x 1.7 cm. These may reflect hemorrhagic or proteinaceous cysts but are  incompletely characterized and incidental renal cell carcinoma is not excluded. Recommend initial renal ultrasound to establish definitively cystic character, multiphasic contrast enhanced CT or MRI may however be further required to exclude malignancy. 7. Prostatomegaly with thickening of the decompressed urinary bladder wall, likely due to chronic outlet obstruction.   Aortic Atherosclerosis (ICD10-I70.0).   10/28/2021 Initial Diagnosis   Colorectal cancer ([place])   11/17/2021 Cancer Staging   Staging form: Colon and Rectum, AJCC 8th Edition - Pathologic stage from 11/17/2021: Stage IIIB (pT3, pN2a, cM0) - Signed by Truitt Merle, MD on 12/03/2021 Histologic grading system: 4 grade system Histologic grade (G): G2 Residual tumor (R): R0 - None   11/17/2021 Definitive Surgery   FINAL MICROSCOPIC DIAGNOSIS:   A. COLON, RIGHT, RESECTION:  Invasive moderately differentiated adenocarcinoma invading into  pericolic adipose (pT3)  Tumor measures 5.2 x 3.6 x 0.8 cm  Margins free of carcinoma and adenomatous change  Six of fifteen pericolic lymph nodes with metastatic carcinoma (6/15, pN2a)  Two additional tubular adenomas without high-grade dysplasia, largest measuring 1.3 cm  Benign appendix   ADDENDUM:  Mismatch Repair Protein (IHC)  SUMMARY INTERPRETATION: NORMAL  Microsatellite Instability (MSI) Result: STABLE   12/23/2021 -  Chemotherapy   Patient is on Treatment Plan : COLORECTAL FOLFOX q14d x 6 months        INTERVAL HISTORY:  George Watkins is here for a follow up of colorectal cancer. He was last seen by me on 04/28/22. He was seen in the  infusion area. He reports he greatly enjoyed his recent vacation. He notes he was careful and didn't get in the water (the ocean).   All other systems were reviewed with the patient and are negative.  MEDICAL HISTORY:  Past Medical History:  Diagnosis Date   Anemia    Arthritis    Colon cancer ([place])    Coronary artery disease    GERD  (gastroesophageal reflux disease)    Heart murmur    Hiatal hernia    Hypertension    Pneumonia    Sleep apnea     SURGICAL HISTORY: Past Surgical History:  Procedure Laterality Date   ANKLE SURGERY     CIRCUMCISION     COLONOSCOPY     HERNIA REPAIR     PORTACATH PLACEMENT N/A 12/09/2021   Procedure: INSERTION PORT-A-CATH with ultrasound;  Surgeon: Leighton Ruff, MD;  Location: [facility];  Service: General;  Laterality: N/A;    I have reviewed the social history and family history with the patient and they are unchanged from previous note.  ALLERGIES:  is allergic to sulfa antibiotics.  MEDICATIONS:  Current Outpatient Medications  Medication Sig Dispense Refill   amLODipine (NORVASC) 5 MG tablet Take 5 mg by mouth daily.     atorvastatin (LIPITOR) 10 MG tablet Take 10 mg by mouth at bedtime.     cholecalciferol (VITAMIN D3) 25 MCG (1000 UNIT) tablet Take 1,000 Units by mouth daily. (Patient not taking: Reported on 03/23/2022)     cloNIDine (CATAPRES) 0.2 MG tablet Take 0.2 mg by mouth 2 (two) times daily.     cyclobenzaprine (FLEXERIL) 5 MG tablet Take 5 mg by mouth at bedtime as needed for muscle spasms. (Patient not taking: Reported on 03/23/2022)     ferrous sulfate 325 (65 FE) MG tablet Take 1 tablet (325 mg total) by mouth 2 (two) times daily with a meal. 30 tablet 0   hydrochlorothiazide (HYDRODIURIL) 12.5 MG tablet Take 1 tablet by mouth daily.     KLOR-CON M10 10 MEQ tablet TAKE 1 TABLET BY MOUTH TWICE A DAY 60 tablet 0   labetalol (NORMODYNE) 200 MG tablet Take 200 mg by mouth 2 (two) times daily.     lidocaine-prilocaine (EMLA) cream Apply to affected area once 30 g 3   losartan (COZAAR) 100 MG tablet Take 0.5 tablets by mouth daily.     losartan-hydrochlorothiazide (HYZAAR) 100-25 MG tablet Take 1 tablet by mouth daily.     Multiple Vitamins-Minerals (MULTIVITAMIN MEN 50+ PO) Take 1 tablet by mouth daily.     ondansetron (ZOFRAN) 8 MG tablet Take 1 tablet (8 mg total) by  mouth 2 (two) times daily as needed for refractory nausea / vomiting. Start on day 3 after chemotherapy. 30 tablet 1   pantoprazole (PROTONIX) 40 MG tablet Take 40 mg by mouth daily. (Patient not taking: Reported on 03/23/2022)     prochlorperazine (COMPAZINE) 10 MG tablet Take 1 tablet (10 mg total) by mouth every 6 (six) hours as needed (Nausea or vomiting). (Patient not taking: Reported on 03/23/2022) 30 tablet 1   traMADol (ULTRAM) 50 MG tablet Take 1-2 tablets (50-100 mg total) by mouth every 6 (six) hours as needed for moderate pain. (Patient not taking: Reported on 03/23/2022) 30 tablet 0   No current facility-administered medications for this visit.    PHYSICAL EXAMINATION: ECOG PERFORMANCE STATUS: 1 - Symptomatic but completely ambulatory  There were no vitals filed for this visit. Wt Readings from Last 3 Encounters:  05/19/22  212 lb (96.2 kg)  04/28/22 207 lb 8 oz (94.1 kg)  04/14/22 214 lb 1.6 oz (97.1 kg)     GENERAL:alert, no distress and comfortable SKIN: skin color normal, no rashes or significant lesions EYES: normal, Conjunctiva are pink and non-injected, sclera clear  NEURO: alert & oriented x 3 with fluent speech  LABORATORY DATA:  I have reviewed the data as listed    Latest Ref Rng & Units 05/19/2022   10:13 AM 04/28/2022   10:23 AM 04/14/2022    8:12 AM  CBC  WBC 4.0 - 10.5 K/uL 4.2  4.0  4.1   Hemoglobin 13.0 - 17.0 g/dL 10.8  12.2  11.4   Hematocrit 39.0 - 52.0 % 33.3  37.3  35.2   Platelets 150 - 400 K/uL 145  141  165         Latest Ref Rng & Units 05/19/2022   10:13 AM 04/28/2022   10:23 AM 04/14/2022    8:12 AM  CMP  Glucose 70 - 99 mg/dL 151  102  149   BUN 8 - 23 mg/dL _0 Creatinine 0.61 - 1.24 mg/dL 1.21  1.14  1.15   Sodium 135 - 145 mmol/L 137  141  137   Potassium 3.5 - 5.1 mmol/L 3.3  3.0  3.5   Chloride 98 - 111 mmol/L 103  104  102   CO2 22 - 32 mmol/L _1 Calcium 8.9 - 10.3 mg/dL 9.0  9.4  9.4   Total Protein 6.5 - 8.1  g/dL 7.0  7.7  7.5   Total Bilirubin 0.3 - 1.2 mg/dL 0.7  0.9  0.6   Alkaline Phos 38 - 126 U/L 128  113  84   AST 15 - 41 U/L 28  32  25   ALT 0 - 44 U/L _2 RADIOGRAPHIC STUDIES: I have personally reviewed the radiological images as listed and agreed with the findings in the report. No results found.    No orders of the defined types were placed in this encounter.  All questions were answered. The patient knows to call the clinic with any problems, questions or concerns. No barriers to learning was detected. The total time spent in the appointment was 30 minutes.     Truitt Merle, MD 05/19/2022   I, Wilburn Mylar, am acting as scribe for Truitt Merle, MD.   I have reviewed the above documentation for accuracy and completeness, and I agree with the above.

## 2022-05-20 ENCOUNTER — Other Ambulatory Visit: Payer: Self-pay | Admitting: Hematology

## 2022-05-21 ENCOUNTER — Inpatient Hospital Stay: Payer: Medicare PPO

## 2022-05-21 ENCOUNTER — Other Ambulatory Visit: Payer: Self-pay

## 2022-05-21 VITALS — BP 115/72 | HR 52 | Temp 98.1°F

## 2022-05-21 DIAGNOSIS — Z5111 Encounter for antineoplastic chemotherapy: Secondary | ICD-10-CM | POA: Diagnosis not present

## 2022-05-21 DIAGNOSIS — C182 Malignant neoplasm of ascending colon: Secondary | ICD-10-CM

## 2022-05-21 MED ORDER — HEPARIN SOD (PORK) LOCK FLUSH 100 UNIT/ML IV SOLN
500.0000 [IU] | Freq: Once | INTRAVENOUS | Status: AC | PRN
  Administered 2022-05-21: 500 [IU]

## 2022-05-21 MED ORDER — SODIUM CHLORIDE 0.9% FLUSH
10.0000 mL | INTRAVENOUS | Status: DC | PRN
  Administered 2022-05-21: 10 mL

## 2022-05-24 ENCOUNTER — Other Ambulatory Visit: Payer: Self-pay

## 2022-05-25 ENCOUNTER — Other Ambulatory Visit: Payer: Self-pay

## 2022-05-25 ENCOUNTER — Telehealth: Payer: Self-pay | Admitting: Hematology

## 2022-05-25 NOTE — Telephone Encounter (Signed)
Scheduled follow-up appointments per 7/27 los. Patient is aware. 

## 2022-05-26 ENCOUNTER — Other Ambulatory Visit: Payer: Medicare PPO

## 2022-05-26 ENCOUNTER — Ambulatory Visit: Payer: Medicare PPO

## 2022-05-26 ENCOUNTER — Ambulatory Visit: Payer: Medicare PPO | Admitting: Hematology

## 2022-05-26 ENCOUNTER — Ambulatory Visit (HOSPITAL_COMMUNITY)
Admission: RE | Admit: 2022-05-26 | Discharge: 2022-05-26 | Disposition: A | Discharge status: Home / Self Care | Payer: Medicare PPO | Source: Ambulatory Visit | Attending: Hematology | Admitting: Hematology

## 2022-05-26 ENCOUNTER — Encounter (HOSPITAL_COMMUNITY): Payer: Self-pay

## 2022-05-26 DIAGNOSIS — C182 Malignant neoplasm of ascending colon: Secondary | ICD-10-CM

## 2022-05-26 MED ORDER — IOHEXOL 300 MG/ML  SOLN: 100.0000 mL | Freq: Once | INTRAMUSCULAR | Status: DC | PRN

## 2022-05-26 MED ORDER — IOHEXOL 9 MG/ML PO SOLN
1000.0000 mL | Freq: Once | ORAL | Status: AC
  Administered 2022-05-26: 1000 mL via ORAL

## 2022-05-26 MED ORDER — IOHEXOL 300 MG/ML  SOLN
100.0000 mL | Freq: Once | INTRAMUSCULAR | Status: AC | PRN
  Administered 2022-05-26: 100 mL via INTRAVENOUS

## 2022-05-26 MED ORDER — SODIUM CHLORIDE (PF) 0.9 % IJ SOLN
INTRAMUSCULAR | Status: AC
  Filled 2022-05-26: qty 50

## 2022-05-31 ENCOUNTER — Ambulatory Visit: Payer: Medicare PPO | Admitting: Hematology

## 2022-06-02 ENCOUNTER — Other Ambulatory Visit: Payer: Medicare PPO

## 2022-06-02 ENCOUNTER — Ambulatory Visit: Payer: Medicare PPO

## 2022-06-02 ENCOUNTER — Ambulatory Visit: Payer: Medicare PPO | Admitting: Nurse Practitioner

## 2022-06-03 ENCOUNTER — Other Ambulatory Visit: Payer: Self-pay

## 2022-06-04 ENCOUNTER — Inpatient Hospital Stay: Payer: Medicare PPO

## 2022-06-08 MED FILL — Dexamethasone Sodium Phosphate Inj 100 MG/10ML: INTRAMUSCULAR | Qty: 1 | Status: AC

## 2022-06-09 ENCOUNTER — Ambulatory Visit: Payer: Medicare PPO

## 2022-06-09 ENCOUNTER — Ambulatory Visit: Payer: Medicare PPO | Admitting: Hematology

## 2022-06-09 ENCOUNTER — Inpatient Hospital Stay (HOSPITAL_BASED_OUTPATIENT_CLINIC_OR_DEPARTMENT_OTHER): Payer: Medicare PPO | Admitting: Hematology

## 2022-06-09 ENCOUNTER — Other Ambulatory Visit: Payer: Medicare PPO

## 2022-06-09 ENCOUNTER — Encounter: Payer: Self-pay | Admitting: Hematology

## 2022-06-09 ENCOUNTER — Other Ambulatory Visit: Payer: Self-pay

## 2022-06-09 ENCOUNTER — Inpatient Hospital Stay: Payer: Medicare PPO | Attending: Physician Assistant

## 2022-06-09 ENCOUNTER — Inpatient Hospital Stay: Payer: Medicare PPO

## 2022-06-09 VITALS — BP 121/81 | HR 47 | Temp 98.0°F | Resp 15 | Wt 213.5 lb

## 2022-06-09 DIAGNOSIS — Z95828 Presence of other vascular implants and grafts: Secondary | ICD-10-CM

## 2022-06-09 DIAGNOSIS — Z5111 Encounter for antineoplastic chemotherapy: Secondary | ICD-10-CM | POA: Diagnosis present

## 2022-06-09 DIAGNOSIS — D509 Iron deficiency anemia, unspecified: Secondary | ICD-10-CM | POA: Diagnosis not present

## 2022-06-09 DIAGNOSIS — I10 Essential (primary) hypertension: Secondary | ICD-10-CM | POA: Insufficient documentation

## 2022-06-09 DIAGNOSIS — C19 Malignant neoplasm of rectosigmoid junction: Secondary | ICD-10-CM

## 2022-06-09 DIAGNOSIS — C779 Secondary and unspecified malignant neoplasm of lymph node, unspecified: Secondary | ICD-10-CM | POA: Diagnosis not present

## 2022-06-09 DIAGNOSIS — C182 Malignant neoplasm of ascending colon: Secondary | ICD-10-CM

## 2022-06-09 DIAGNOSIS — E876 Hypokalemia: Secondary | ICD-10-CM | POA: Insufficient documentation

## 2022-06-09 LAB — CBC WITH DIFFERENTIAL (CANCER CENTER ONLY)
Abs Immature Granulocytes: 0.03 10*3/uL (ref 0.00–0.07)
Basophils Absolute: 0.1 10*3/uL (ref 0.0–0.1)
Basophils Relative: 2 %
Eosinophils Absolute: 0.2 10*3/uL (ref 0.0–0.5)
Eosinophils Relative: 4 %
HCT: 34.8 % — ABNORMAL LOW (ref 39.0–52.0)
Hemoglobin: 11.4 g/dL — ABNORMAL LOW (ref 13.0–17.0)
Immature Granulocytes: 1 %
Lymphocytes Relative: 37 %
Lymphs Abs: 1.5 10*3/uL (ref 0.7–4.0)
MCH: 29.4 pg (ref 26.0–34.0)
MCHC: 32.8 g/dL (ref 30.0–36.0)
MCV: 89.7 fL (ref 80.0–100.0)
Monocytes Absolute: 1.1 10*3/uL — ABNORMAL HIGH (ref 0.1–1.0)
Monocytes Relative: 27 %
Neutro Abs: 1.2 10*3/uL — ABNORMAL LOW (ref 1.7–7.7)
Neutrophils Relative %: 29 %
Platelet Count: 164 10*3/uL (ref 150–400)
RBC: 3.88 MIL/uL — ABNORMAL LOW (ref 4.22–5.81)
RDW: 14.4 % (ref 11.5–15.5)
WBC Count: 4.1 10*3/uL (ref 4.0–10.5)
nRBC: 0 % (ref 0.0–0.2)

## 2022-06-09 LAB — CMP (CANCER CENTER ONLY)
ALT: 21 U/L (ref 0–44)
AST: 31 U/L (ref 15–41)
Albumin: 3.5 g/dL (ref 3.5–5.0)
Alkaline Phosphatase: 97 U/L (ref 38–126)
Anion gap: 6 (ref 5–15)
BUN: 11 mg/dL (ref 8–23)
CO2: 30 mmol/L (ref 22–32)
Calcium: 9.3 mg/dL (ref 8.9–10.3)
Chloride: 102 mmol/L (ref 98–111)
Creatinine: 1.25 mg/dL — ABNORMAL HIGH (ref 0.61–1.24)
GFR, Estimated: >60 mL/min (ref >60)
Glucose, Bld: 180 mg/dL — ABNORMAL HIGH (ref 70–99)
Potassium: 3.4 mmol/L — ABNORMAL LOW (ref 3.5–5.1)
Sodium: 138 mmol/L (ref 135–145)
Total Bilirubin: 0.8 mg/dL (ref 0.3–1.2)
Total Protein: 7.1 g/dL (ref 6.5–8.1)

## 2022-06-09 MED ORDER — PALONOSETRON HCL INJECTION 0.25 MG/5ML
0.2500 mg | Freq: Once | INTRAVENOUS | Status: AC
  Administered 2022-06-09: 0.25 mg via INTRAVENOUS
  Filled 2022-06-09: qty 5

## 2022-06-09 MED ORDER — SODIUM CHLORIDE 0.9 % IV SOLN
2400.0000 mg/m2 | INTRAVENOUS | Status: DC
  Administered 2022-06-09: 5200 mg via INTRAVENOUS
  Filled 2022-06-09: qty 104

## 2022-06-09 MED ORDER — DEXTROSE 5 % IV SOLN: Freq: Once | INTRAVENOUS | Status: AC

## 2022-06-09 MED ORDER — SODIUM CHLORIDE 0.9% FLUSH
10.0000 mL | Freq: Once | INTRAVENOUS | Status: AC
  Administered 2022-06-09: 10 mL

## 2022-06-09 MED ORDER — LEUCOVORIN CALCIUM INJECTION 350 MG
400.0000 mg/m2 | Freq: Once | INTRAVENOUS | Status: AC
  Administered 2022-06-09: 868 mg via INTRAVENOUS
  Filled 2022-06-09: qty 43.4

## 2022-06-09 MED ORDER — FLUOROURACIL CHEMO INJECTION 2.5 GM/50ML
400.0000 mg/m2 | Freq: Once | INTRAVENOUS | Status: AC
  Administered 2022-06-09: 850 mg via INTRAVENOUS
  Filled 2022-06-09: qty 17

## 2022-06-09 MED ORDER — SODIUM CHLORIDE 0.9 % IV SOLN
10.0000 mg | Freq: Once | INTRAVENOUS | Status: AC
  Administered 2022-06-09: 10 mg via INTRAVENOUS
  Filled 2022-06-09: qty 10

## 2022-06-09 NOTE — Progress Notes (Signed)
[facility]   [phone] Fax:(336) [phone]   Clinic Follow up Note   Patient Care Team: Arthur Holms, NP as PCP - General (Nurse Practitioner) Truitt Merle, MD as Consulting Physician (Oncology)  Date of Service:  06/09/2022  CHIEF COMPLAINT: f/u of colorectal cancer  CURRENT THERAPY:  Adjuvant FOLFOX, started 12/23/21  ASSESSMENT & PLAN:  George Watkins is a 71 y.o. male with   1. Colorectal cancer, adenocarcinoma of the ascending colon, pT3N2aMx, at least stage IIIB, MSS -Colonoscopy performed on 10/13/21 for IDA and abdominal pain showed 4 cm mass near hepatic flexure. Path confirmed adenocarcinoma. -Baseline CEA was elevated at 25.9 on 10/13/21 -PET scan on 11/09/21 showed: hypermetabolism to colon mass and adjacent mildly enlarged ileocolonic mesenteric lymph nodes. Otherwise, no definitive evidence of additional spread. -partial colectomy on 11/17/21 with Dr. Marcello Moores showed 5.6 cm invasive moderately differentiated adenocarcinoma invading into pericolic adipose. Margins negative, but metastatic carcinoma in 6 lymph nodes (6/15). Two additional tubular adenomas without high-grade dysplasia noted. MMR normal and MSI stable.  -given his multiple (6) positive lymph nodes, he began adjuvant chemotherapy with FOLFOX on 12/23/21. He is tolerating well overall with mild side effects, he is able to recover well. -restaging CT AP 05/26/22 showed stable disease, no signs of recurrence. I reviewed the results with him today. -labs reviewed, overall stable. His potassium is improved but remains low at 3.4. He endorses taking KCL but does note forgetting sometimes. Adequate to proceed with treatment but due to significant numbness on tuning fork exam, we will discontinue oxali. I advised him to call me if he develops pain and/or significant tingling.   2. Symptom Management: Low appetite, Neuropathy, Low heart rate -Metallic taste is affecting appetite, and his weight is down  some. Encouraged him to supplement with protein drinks like Ensure. -He reports a "funny feeling" in his fingers and feet, denies functional deficits. His sensation is significantly decreased on tuning fork exam today. We will stop oxaliplatin. -his HR is 47 today, I advised him to half his dose of labetalol and monitor at home.   3. Iron deficiency anemia  -currently taking ferrous sulfate BID, has had good response. -Hgb stable at 11.4 today (06/09/22)   4. Incidental Scan Findings: Renal Lesion, Left axillary adenopathy -seen on CT CAP 10/21/21  -adenopathy was positive on PET in 10/2021. Biopsy deferred due to normal-appearing nodes. -renal lesions stable on CT AP 05/26/22   5. Hypokalemia:   -Currently taking potassium chloride 10 mEq twice daily    6.  Peripheral neuropathy, G1 -Secondary to chemotherapy oxaliplatin, which we will stop today. -He has significant sensation loss on tuning fork exam today, no significant tingling or pain.   PLAN: -Proceed with C11 with 5FU/LV alone due to neuropathy, will hold oxaliplatin for today and last cycle in 2 weeks -lab, flush, f/u, and 5FU in 2 weeks   No problem-specific Assessment & Plan notes found for this encounter.   SUMMARY OF ONCOLOGIC HISTORY: Oncology History Overview Note   Cancer Staging  Colorectal cancer [facility]) Staging form: Colon and Rectum, AJCC 8th Edition - Pathologic stage from 11/17/2021: Stage IIIB (pT3, pN2a, cM0) - Signed by Truitt Merle, MD on 12/03/2021    Cancer of right colon [facility])  10/13/2021 Procedure   Upper GI endoscopy by Dr. Ardis Hughs for IDA and abdominal pain  Findings:  - The esophagus was normal. - The stomach was normal. - The examined duodenum was normal.   10/13/2021 Procedure  Colonoscopy Indications: Iron deficiency  - A fungating and ulcerated partially obstructing, clearly malignant mass was found at the hepatic flexure. The mass was circumferential and measured 4cm in length. Biopsies  were taken with a cold forceps for histology and then the mucosa just distal to the mass was tattooed with an injection of Spot (carbon black). jar 1 Findings: - Two pedunculated polyps were found in the mid transverse colon. The polyps were 8 to 11 mm in size. These polyps were removed with a hot snare. Resection and retrieval were complete. jar 2 - Five pedunculated and sessile polyps were found in the distal transverse colon. The polyps were 4 to 14 mm in size. Two were removed with hot snare, three were removed with cold snare. Resection and retrieval were complete. jar 3 - A 20 mm polyp was found in the distal transverse colon. The polyp was pedunculated. The polyp was removed with a hot snare. Resection and retrieval were complete. jar 4 - The exam was otherwise without abnormality on direct and retroflexion views.   10/13/2021 Pathology Results   REPORT OF SURGICAL PATHOLOGY Accession: [id number]  Diagnosis 1. Hepatic Flexure Biopsy - ADENOCARCINOMA, SEE NOTE   10/21/2021 Imaging   CT CHEST ABDOMEN PELVIS W CONTRAST  IMPRESSION: 1. There is a circumferential mass of the mid ascending colon measuring approximately 5 cm in length, in keeping with newly diagnosed primary colon malignancy. 2. Adjacent to the colon mass, there is mesenteric fat stranding suggesting invasion beyond the muscularis as well as abnormally enlarged mesocolonic lymph nodes. 3. Mildly prominent, asymmetric right external iliac lymph nodes, nonspecific although suspicious for early nodal metastatic disease. 4. Asymmetrically prominent left axillary lymph nodes, nonspecific, these nodes less favored to reflect metastatic disease given location in the absence of other evidence of metastatic disease above the diaphragm. Both external iliac and axillary lymph nodes could be characterized for abnormal metabolic activity by PET-CT. 5. No evidence of organ metastatic disease in the chest, abdomen,  or pelvis. 6. Exophytic soft tissue attenuation lesion of the anterior midportion of the left kidney measuring 1.2 cm. Partially exophytic soft tissue attenuation lesion of the posteroinferior pole of the left kidney measuring 1.9 x 1.7 cm. These may reflect hemorrhagic or proteinaceous cysts but are incompletely characterized and incidental renal cell carcinoma is not excluded. Recommend initial renal ultrasound to establish definitively cystic character, multiphasic contrast enhanced CT or MRI may however be further required to exclude malignancy. 7. Prostatomegaly with thickening of the decompressed urinary bladder wall, likely due to chronic outlet obstruction.   Aortic Atherosclerosis (ICD10-I70.0).   10/28/2021 Initial Diagnosis   Colorectal cancer ([place])   11/17/2021 Cancer Staging   Staging form: Colon and Rectum, AJCC 8th Edition - Pathologic stage from 11/17/2021: Stage IIIB (pT3, pN2a, cM0) - Signed by Truitt Merle, MD on 12/03/2021 Histologic grading system: 4 grade system Histologic grade (G): G2 Residual tumor (R): R0 - None   11/17/2021 Definitive Surgery   FINAL MICROSCOPIC DIAGNOSIS:   A. COLON, RIGHT, RESECTION:  Invasive moderately differentiated adenocarcinoma invading into  pericolic adipose (pT3)  Tumor measures 5.2 x 3.6 x 0.8 cm  Margins free of carcinoma and adenomatous change  Six of fifteen pericolic lymph nodes with metastatic carcinoma (6/15, pN2a)  Two additional tubular adenomas without high-grade dysplasia, largest measuring 1.3 cm  Benign appendix   ADDENDUM:  Mismatch Repair Protein (IHC)  SUMMARY INTERPRETATION: NORMAL  Microsatellite Instability (MSI) Result: STABLE   12/23/2021 -  Chemotherapy   Patient is  on Treatment Plan : COLORECTAL FOLFOX q14d x 6 months        INTERVAL HISTORY:  ZIAH LEANDRO is here for a follow up of colorectal cancer. He was last seen by me on 05/19/22. He presents to the clinic alone. He reports some neuropathy  symptoms (described as a "funny feeling") in his fingers and feet. He denies any significant functional deficits, notes maybe some difficulty with buttoning.   All other systems were reviewed with the patient and are negative.  MEDICAL HISTORY:  Past Medical History:  Diagnosis Date   Anemia    Arthritis    Colon cancer ([place])    Coronary artery disease    GERD (gastroesophageal reflux disease)    Heart murmur    Hiatal hernia    Hypertension    Pneumonia    Sleep apnea     SURGICAL HISTORY: Past Surgical History:  Procedure Laterality Date   ANKLE SURGERY     CIRCUMCISION     COLONOSCOPY     HERNIA REPAIR     PORTACATH PLACEMENT N/A 12/09/2021   Procedure: INSERTION PORT-A-CATH with ultrasound;  Surgeon: Leighton Ruff, MD;  Location: [facility];  Service: General;  Laterality: N/A;    I have reviewed the social history and family history with the patient and they are unchanged from previous note.  ALLERGIES:  is allergic to sulfa antibiotics.  MEDICATIONS:  Current Outpatient Medications  Medication Sig Dispense Refill   amLODipine (NORVASC) 5 MG tablet Take 5 mg by mouth daily.     atorvastatin (LIPITOR) 10 MG tablet Take 10 mg by mouth at bedtime.     cholecalciferol (VITAMIN D3) 25 MCG (1000 UNIT) tablet Take 1,000 Units by mouth daily. (Patient not taking: Reported on 03/23/2022)     cloNIDine (CATAPRES) 0.2 MG tablet Take 0.2 mg by mouth 2 (two) times daily.     cyclobenzaprine (FLEXERIL) 5 MG tablet Take 5 mg by mouth at bedtime as needed for muscle spasms. (Patient not taking: Reported on 03/23/2022)     ferrous sulfate 325 (65 FE) MG tablet Take 1 tablet (325 mg total) by mouth 2 (two) times daily with a meal. 30 tablet 0   hydrochlorothiazide (HYDRODIURIL) 12.5 MG tablet Take 1 tablet by mouth daily.     KLOR-CON M10 10 MEQ tablet TAKE 1 TABLET BY MOUTH TWICE A DAY 60 tablet 0   labetalol (NORMODYNE) 200 MG tablet Take 200 mg by mouth 2 (two) times daily.      lidocaine-prilocaine (EMLA) cream Apply to affected area once 30 g 3   losartan (COZAAR) 100 MG tablet Take 0.5 tablets by mouth daily.     losartan-hydrochlorothiazide (HYZAAR) 100-25 MG tablet Take 1 tablet by mouth daily.     Multiple Vitamins-Minerals (MULTIVITAMIN MEN 50+ PO) Take 1 tablet by mouth daily.     ondansetron (ZOFRAN) 8 MG tablet Take 1 tablet (8 mg total) by mouth 2 (two) times daily as needed for refractory nausea / vomiting. Start on day 3 after chemotherapy. 30 tablet 1   pantoprazole (PROTONIX) 40 MG tablet Take 40 mg by mouth daily. (Patient not taking: Reported on 03/23/2022)     prochlorperazine (COMPAZINE) 10 MG tablet Take 1 tablet (10 mg total) by mouth every 6 (six) hours as needed (Nausea or vomiting). (Patient not taking: Reported on 03/23/2022) 30 tablet 1   traMADol (ULTRAM) 50 MG tablet Take 1-2 tablets (50-100 mg total) by mouth every 6 (six) hours as needed for moderate  pain. (Patient not taking: Reported on 03/23/2022) 30 tablet 0   No current facility-administered medications for this visit.   Facility-Administered Medications Ordered in Other Visits  Medication Dose Route Frequency Provider Last Rate Last Admin   fluorouracil (ADRUCIL) 5,200 mg in sodium chloride 0.9 % 146 mL chemo infusion  2,400 mg/m2 (Treatment Plan Recorded) Intravenous 1 day or 1 dose Truitt Merle, MD       fluorouracil (ADRUCIL) chemo injection 850 mg  400 mg/m2 (Treatment Plan Recorded) Intravenous Once Truitt Merle, MD       leucovorin 868 mg in dextrose 5 % 250 mL infusion  400 mg/m2 (Treatment Plan Recorded) Intravenous Once Truitt Merle, MD        PHYSICAL EXAMINATION: ECOG PERFORMANCE STATUS: 1 - Symptomatic but completely ambulatory  Vitals:   06/09/22 1018  BP: 121/81  Pulse: (!) 47  Resp: 15  Temp: 98 F (36.7 C)  SpO2: 100%   Wt Readings from Last 3 Encounters:  06/09/22 213 lb 8 oz (96.8 kg)  05/19/22 212 lb (96.2 kg)  04/28/22 207 lb 8 oz (94.1 kg)     GENERAL:alert, no  distress and comfortable SKIN: skin color normal, no rashes or significant lesions EYES: normal, Conjunctiva are pink and non-injected, sclera clear  NEURO: alert & oriented x 3 with fluent speech  LABORATORY DATA:  I have reviewed the data as listed    Latest Ref Rng & Units 06/09/2022    9:44 AM 05/19/2022   10:13 AM 04/28/2022   10:23 AM  CBC  WBC 4.0 - 10.5 K/uL 4.1  4.2  4.0   Hemoglobin 13.0 - 17.0 g/dL 11.4  10.8  12.2   Hematocrit 39.0 - 52.0 % 34.8  33.3  37.3   Platelets 150 - 400 K/uL 164  145  141         Latest Ref Rng & Units 06/09/2022    9:44 AM 05/19/2022   10:13 AM 04/28/2022   10:23 AM  CMP  Glucose 70 - 99 mg/dL 180  151  102   BUN 8 - 23 mg/dL _0 Creatinine 0.61 - 1.24 mg/dL 1.25  1.21  1.14   Sodium 135 - 145 mmol/L 138  137  141   Potassium 3.5 - 5.1 mmol/L 3.4  3.3  3.0   Chloride 98 - 111 mmol/L 102  103  104   CO2 22 - 32 mmol/L _1 Calcium 8.9 - 10.3 mg/dL 9.3  9.0  9.4   Total Protein 6.5 - 8.1 g/dL 7.1  7.0  7.7   Total Bilirubin 0.3 - 1.2 mg/dL 0.8  0.7  0.9   Alkaline Phos 38 - 126 U/L 97  128  113   AST 15 - 41 U/L 31  28  32   ALT 0 - 44 U/L _2 RADIOGRAPHIC STUDIES: I have personally reviewed the radiological images as listed and agreed with the findings in the report. No results found.    No orders of the defined types were placed in this encounter.  All questions were answered. The patient knows to call the clinic with any problems, questions or concerns. No barriers to learning was detected. The total time spent in the appointment was 30 minutes.     Truitt Merle, MD 06/09/2022   I, Wilburn Mylar, am acting as scribe for Truitt Merle, MD.  I have reviewed the above documentation for accuracy and completeness, and I agree with the above.

## 2022-06-09 NOTE — Patient Instructions (Signed)
[place] [facility] ONCOLOGY  Discharge Instructions: Thank you for choosing [facility] to provide your oncology and hematology care.   If you have a lab appointment with the [facility], please go directly to the [facility] and check in at the registration area.   Wear comfortable clothing and clothing appropriate for easy access to any Portacath or PICC line.   We strive to give you quality time with your provider. You may need to reschedule your appointment if you arrive late (15 or more minutes).  Arriving late affects you and other patients whose appointments are after yours.  Also, if you miss three or more appointments without notifying the office, you may be dismissed from the clinic at the provider's discretion.      For prescription refill requests, have your pharmacy contact our office and allow 72 hours for refills to be completed.    Today you received the following chemotherapy and/or immunotherapy agents: Leucovorin, fluorourcil      To help prevent nausea and vomiting after your treatment, we encourage you to take your nausea medication as directed.  BELOW ARE SYMPTOMS THAT SHOULD BE REPORTED IMMEDIATELY: *FEVER GREATER THAN 100.4 F (38 C) OR HIGHER *CHILLS OR SWEATING *NAUSEA AND VOMITING THAT IS NOT CONTROLLED WITH YOUR NAUSEA MEDICATION *UNUSUAL SHORTNESS OF BREATH *UNUSUAL BRUISING OR BLEEDING *URINARY PROBLEMS (pain or burning when urinating, or frequent urination) *BOWEL PROBLEMS (unusual diarrhea, constipation, pain near the anus) TENDERNESS IN MOUTH AND THROAT WITH OR WITHOUT PRESENCE OF ULCERS (sore throat, sores in mouth, or a toothache) UNUSUAL RASH, SWELLING OR PAIN  UNUSUAL VAGINAL DISCHARGE OR ITCHING   Items with * indicate a potential emergency and should be followed up as soon as possible or go to the Emergency Department if any problems should occur.  Please show the CHEMOTHERAPY ALERT CARD or IMMUNOTHERAPY ALERT CARD  at check-in to the Emergency Department and triage nurse.  Should you have questions after your visit or need to cancel or reschedule your appointment, please contact [facility]  Dept: [phone]  and follow the prompts.  Office hours are 8:00 a.m. to 4:30 p.m. Monday - Friday. Please note that voicemails left after 4:00 p.m. may not be returned until the following business day.  We are closed weekends and major holidays. You have access to a nurse at all times for urgent questions. Please call the main number to the clinic Dept: [phone] and follow the prompts.   For any non-urgent questions, you may also contact your provider using MyChart. We now offer e-Visits for anyone 1 and older to request care online for non-urgent symptoms. For details visit [email or web address].   Also download the MyChart app! Go to the app store, search "MyChart", open the app, select [facility], and log in with your MyChart username and password.  Masks are optional in the cancer centers. If you would like for your care team to wear a mask while they are taking care of you, please let them know. You may have one support person who is at least 70 years old accompany you for your appointments.

## 2022-06-11 ENCOUNTER — Inpatient Hospital Stay: Payer: Medicare PPO

## 2022-06-11 VITALS — BP 112/77 | HR 60 | Temp 98.1°F | Resp 16

## 2022-06-11 DIAGNOSIS — Z5111 Encounter for antineoplastic chemotherapy: Secondary | ICD-10-CM | POA: Diagnosis not present

## 2022-06-11 DIAGNOSIS — C182 Malignant neoplasm of ascending colon: Secondary | ICD-10-CM

## 2022-06-11 MED ORDER — HEPARIN SOD (PORK) LOCK FLUSH 100 UNIT/ML IV SOLN
500.0000 [IU] | Freq: Once | INTRAVENOUS | Status: AC | PRN
  Administered 2022-06-11: 500 [IU]

## 2022-06-11 MED ORDER — SODIUM CHLORIDE 0.9% FLUSH
10.0000 mL | INTRAVENOUS | Status: DC | PRN
  Administered 2022-06-11: 10 mL

## 2022-06-11 NOTE — Patient Instructions (Signed)

## 2022-06-16 ENCOUNTER — Ambulatory Visit: Payer: Medicare PPO

## 2022-06-16 ENCOUNTER — Other Ambulatory Visit: Payer: Medicare PPO

## 2022-06-16 ENCOUNTER — Ambulatory Visit: Payer: Medicare PPO | Admitting: Hematology

## 2022-06-22 ENCOUNTER — Other Ambulatory Visit: Payer: Self-pay | Admitting: Hematology

## 2022-06-22 MED FILL — Dexamethasone Sodium Phosphate Inj 100 MG/10ML: INTRAMUSCULAR | Qty: 1 | Status: AC

## 2022-06-23 ENCOUNTER — Inpatient Hospital Stay: Payer: Medicare PPO

## 2022-06-23 ENCOUNTER — Inpatient Hospital Stay (HOSPITAL_BASED_OUTPATIENT_CLINIC_OR_DEPARTMENT_OTHER): Payer: Medicare PPO | Admitting: Hematology

## 2022-06-23 ENCOUNTER — Encounter: Payer: Self-pay | Admitting: Hematology

## 2022-06-23 ENCOUNTER — Other Ambulatory Visit: Payer: Self-pay

## 2022-06-23 VITALS — BP 123/87 | HR 55 | Temp 98.0°F | Resp 18 | Wt 223.0 lb

## 2022-06-23 DIAGNOSIS — Z95828 Presence of other vascular implants and grafts: Secondary | ICD-10-CM

## 2022-06-23 DIAGNOSIS — C182 Malignant neoplasm of ascending colon: Secondary | ICD-10-CM

## 2022-06-23 DIAGNOSIS — Z5111 Encounter for antineoplastic chemotherapy: Secondary | ICD-10-CM | POA: Diagnosis not present

## 2022-06-23 DIAGNOSIS — C19 Malignant neoplasm of rectosigmoid junction: Secondary | ICD-10-CM

## 2022-06-23 LAB — CBC WITH DIFFERENTIAL (CANCER CENTER ONLY)
Abs Immature Granulocytes: 0.01 10*3/uL (ref 0.00–0.07)
Basophils Absolute: 0.1 10*3/uL (ref 0.0–0.1)
Basophils Relative: 1 %
Eosinophils Absolute: 0.2 10*3/uL (ref 0.0–0.5)
Eosinophils Relative: 4 %
HCT: 34.5 % — ABNORMAL LOW (ref 39.0–52.0)
Hemoglobin: 11.1 g/dL — ABNORMAL LOW (ref 13.0–17.0)
Immature Granulocytes: 0 %
Lymphocytes Relative: 26 %
Lymphs Abs: 1.5 10*3/uL (ref 0.7–4.0)
MCH: 29.5 pg (ref 26.0–34.0)
MCHC: 32.2 g/dL (ref 30.0–36.0)
MCV: 91.8 fL (ref 80.0–100.0)
Monocytes Absolute: 0.8 10*3/uL (ref 0.1–1.0)
Monocytes Relative: 14 %
Neutro Abs: 3.2 10*3/uL (ref 1.7–7.7)
Neutrophils Relative %: 55 %
Platelet Count: 135 10*3/uL — ABNORMAL LOW (ref 150–400)
RBC: 3.76 MIL/uL — ABNORMAL LOW (ref 4.22–5.81)
RDW: 14.6 % (ref 11.5–15.5)
WBC Count: 5.9 10*3/uL (ref 4.0–10.5)
nRBC: 0 % (ref 0.0–0.2)

## 2022-06-23 LAB — CMP (CANCER CENTER ONLY)
ALT: 25 U/L (ref 0–44)
AST: 29 U/L (ref 15–41)
Albumin: 3.5 g/dL (ref 3.5–5.0)
Alkaline Phosphatase: 82 U/L (ref 38–126)
Anion gap: 4 — ABNORMAL LOW (ref 5–15)
BUN: 15 mg/dL (ref 8–23)
CO2: 31 mmol/L (ref 22–32)
Calcium: 9.2 mg/dL (ref 8.9–10.3)
Chloride: 104 mmol/L (ref 98–111)
Creatinine: 1.13 mg/dL (ref 0.61–1.24)
GFR, Estimated: >60 mL/min (ref >60)
Glucose, Bld: 178 mg/dL — ABNORMAL HIGH (ref 70–99)
Potassium: 3.6 mmol/L (ref 3.5–5.1)
Sodium: 139 mmol/L (ref 135–145)
Total Bilirubin: 0.9 mg/dL (ref 0.3–1.2)
Total Protein: 6.8 g/dL (ref 6.5–8.1)

## 2022-06-23 LAB — CEA (IN HOUSE-CHCC): CEA (CHCC-In House): 1.1 ng/mL (ref 0.00–5.00)

## 2022-06-23 MED ORDER — DEXTROSE 5 % IV SOLN: Freq: Once | INTRAVENOUS | Status: AC

## 2022-06-23 MED ORDER — FLUOROURACIL CHEMO INJECTION 2.5 GM/50ML
400.0000 mg/m2 | Freq: Once | INTRAVENOUS | Status: AC
  Administered 2022-06-23: 850 mg via INTRAVENOUS
  Filled 2022-06-23: qty 17

## 2022-06-23 MED ORDER — SODIUM CHLORIDE 0.9 % IV SOLN
10.0000 mg | Freq: Once | INTRAVENOUS | Status: AC
  Administered 2022-06-23: 10 mg via INTRAVENOUS
  Filled 2022-06-23: qty 10

## 2022-06-23 MED ORDER — HEPARIN SOD (PORK) LOCK FLUSH 100 UNIT/ML IV SOLN: 500.0000 [IU] | Freq: Once | INTRAVENOUS | Status: DC | PRN

## 2022-06-23 MED ORDER — SODIUM CHLORIDE 0.9 % IV SOLN
5000.0000 mg | INTRAVENOUS | Status: DC
  Administered 2022-06-23: 5000 mg via INTRAVENOUS
  Filled 2022-06-23: qty 100

## 2022-06-23 MED ORDER — SODIUM CHLORIDE 0.9% FLUSH
10.0000 mL | Freq: Once | INTRAVENOUS | Status: AC
  Administered 2022-06-23: 10 mL

## 2022-06-23 MED ORDER — SODIUM CHLORIDE 0.9% FLUSH
10.0000 mL | INTRAVENOUS | Status: DC | PRN
  Administered 2022-06-23: 10 mL

## 2022-06-23 MED ORDER — PALONOSETRON HCL INJECTION 0.25 MG/5ML
0.2500 mg | Freq: Once | INTRAVENOUS | Status: AC
  Administered 2022-06-23: 0.25 mg via INTRAVENOUS
  Filled 2022-06-23: qty 5

## 2022-06-23 MED ORDER — LEUCOVORIN CALCIUM INJECTION 350 MG
400.0000 mg/m2 | Freq: Once | INTRAVENOUS | Status: AC
  Administered 2022-06-23: 868 mg via INTRAVENOUS
  Filled 2022-06-23: qty 43.4

## 2022-06-23 NOTE — Patient Instructions (Addendum)
[facility]COLOGY  Discharge Instructions: Thank you for choosing [facility] to provide your oncology and hematology care.   If you have a lab appointment with the Cancer Center, please go directly to the Cancer Center and check in at the registration area.   Wear comfortable clothing and clothing appropriate for easy access to any Portacath or PICC line.   We strive to give you quality time with your provider. You may need to reschedule your appointment if you arrive late (15 or more minutes).  Arriving late affects you and other patients whose appointments are after yours.  Also, if you miss three or more appointments without notifying the office, you may be dismissed from the clinic at the provider's discretion.      For prescription refill requests, have your pharmacy contact our office and allow 72 hours for refills to be completed.    Today you received the following chemotherapy and/or immunotherapy agents: Leucovorin, Fluorouracil     To help prevent nausea and vomiting after your treatment, we encourage you to take your nausea medication as directed.  BELOW ARE SYMPTOMS THAT SHOULD BE REPORTED IMMEDIATELY: *FEVER GREATER THAN 100.4 F (38 C) OR HIGHER *CHILLS OR SWEATING *NAUSEA AND VOMITING THAT IS NOT CONTROLLED WITH YOUR NAUSEA MEDICATION *UNUSUAL SHORTNESS OF BREATH *UNUSUAL BRUISING OR BLEEDING *URINARY PROBLEMS (pain or burning when urinating, or frequent urination) *BOWEL PROBLEMS (unusual diarrhea, constipation, pain near the anus) TENDERNESS IN MOUTH AND THROAT WITH OR WITHOUT PRESENCE OF ULCERS (sore throat, sores in mouth, or a toothache) UNUSUAL RASH, SWELLING OR PAIN  UNUSUAL VAGINAL DISCHARGE OR ITCHING   Items with * indicate a potential emergency and should be followed up as soon as possible or go to the Emergency Department if any problems should occur.  Please show the CHEMOTHERAPY ALERT CARD or IMMUNOTHERAPY ALERT CARD  at check-in to the Emergency Department and triage nurse.  Should you have questions after your visit or need to cancel or reschedule your appointment, please contact [facility]  Dept: [phone]  and follow the prompts.  Office hours are 8:00 a.m. to 4:30 p.m. Monday - Friday. Please note that voicemails left after 4:00 p.m. may not be returned until the following business day.  We are closed weekends and major holidays. You have access to a nurse at all times for urgent questions. Please call the main number to the clinic Dept: [phone] and follow the prompts.   For any non-urgent questions, you may also contact your provider using MyChart. We now offer e-Visits for anyone 18 and older to request care online for non-urgent symptoms. For details visit [email or web address].   Also download the MyChart app! Go to the app store, search "MyChart", open the app, select [facility], and log in with your MyChart username and password.  Masks are optional in the cancer centers. If you would like for your care team to wear a mask while they are taking care of you, please let them know. You may have one support person who is at least 70 years old accompany you for your appointments. The chemotherapy medication bag should finish at 46 hours, 96 hours, or 7 days. For example, if your pump is scheduled for 46 hours and it was put on at 4:00 p.m., it should finish at 2:00 p.m. the day it is scheduled to come off regardless of your appointment time.     Estimated time to finish at 08:45 Saturday   06/25/22.   If the display on your pump reads "Low Volume" and it is beeping, take the batteries out of the pump and come to the cancer center for it to be taken off.   If the pump alarms go off prior to the pump reading "Low Volume" then call [phone] and someone can assist you.  If the plunger comes out and the chemotherapy medication is leaking out, please use your home  chemo spill kit to clean up the spill. Do NOT use paper towels or other household products.  If you have problems or questions regarding your pump, please call either [phone] (24 hours a day) or the cancer center Monday-Friday 8:00 a.m.- 4:30 p.m. at the clinic number and we will assist you. If you are unable to get assistance, then go to the nearest Emergency Department and ask the staff to contact the IV team for assistance.

## 2022-06-23 NOTE — Progress Notes (Signed)
George Watkins   [phone] [phone]   Clinic Follow up Note   Patient Care Team: Arthur Holms, NP as PCP - General (Nurse Practitioner) Truitt Merle, MD as Consulting Physician (Oncology)  Date of Service:  06/23/2022  CHIEF COMPLAINT: f/u of colorectal cancer  CURRENT THERAPY:  Adjuvant FOLFOX, started 12/23/21  -oxali d/c after C10  ASSESSMENT & PLAN:  George Watkins is a 71 y.o. male with   1. Colorectal cancer, adenocarcinoma of the ascending colon, pT3N2aMx, at least stage IIIB, MSS -Colonoscopy performed on 10/13/21 for IDA and abdominal pain showed 4 cm mass near hepatic flexure. Path confirmed adenocarcinoma. -Baseline CEA was elevated at 25.9 on 10/13/21 -PET scan on 11/09/21 showed: hypermetabolism to colon mass and adjacent mildly enlarged ileocolonic mesenteric lymph nodes. Otherwise, no definitive evidence of additional spread. -partial colectomy on 11/17/21 with Dr. Marcello Moores showed 5.6 cm invasive moderately differentiated adenocarcinoma invading into pericolic adipose. Margins negative, but metastatic carcinoma in 6 lymph nodes (6/15). Two additional tubular adenomas without high-grade dysplasia noted. MMR normal and MSI stable.  -given his multiple (6) positive lymph nodes, he began adjuvant chemotherapy with FOLFOX on 12/23/21. He is tolerating well overall with mild side effects, he is able to recover well. -restaging CT AP 05/26/22 showed stable disease, no signs of recurrence.  -oxali was d/c after C10 due to significant neuropathy. -labs reviewed, overall stable. Adequate to proceed with treatment.   2. Neuropathy, G1-2 -He developed neuropathy and tuning fork exam on 06/09/22 was significantly decreased. Oxali discontinued after C10.   3. Iron deficiency anemia  -currently taking ferrous sulfate BID, has had good response. -Hgb stable at 11.1 today (06/23/22)   4. Incidental Scan Findings: Renal Lesion, Left axillary adenopathy -seen on  CT CAP 10/21/21  -adenopathy was positive on PET in 10/2021. Biopsy deferred due to normal-appearing nodes. -renal lesions stable on CT AP 05/26/22   5. Hypokalemia:   -Currently taking potassium chloride 10 mEq twice daily     PLAN: -Proceed with C12 5FU/LV  -lab. Flush and f/u in 8 weeks    No problem-specific Assessment & Plan notes found for this encounter.   SUMMARY OF ONCOLOGIC HISTORY: Oncology History Overview Note   Cancer Staging  Colorectal cancer [facility]) Staging form: Colon and Rectum, AJCC 8th Edition - Pathologic stage from 11/17/2021: Stage IIIB (pT3, pN2a, cM0) - Signed by Truitt Merle, MD on 12/03/2021    Cancer of right colon [facility])  10/13/2021 Procedure   Upper GI endoscopy by Dr. Ardis Hughs for IDA and abdominal pain  Findings:  - The esophagus was normal. - The stomach was normal. - The examined duodenum was normal.   10/13/2021 Procedure   Colonoscopy Indications: Iron deficiency  - A fungating and ulcerated partially obstructing, clearly malignant mass was found at the hepatic flexure. The mass was circumferential and measured 4cm in length. Biopsies were taken with a cold forceps for histology and then the mucosa just distal to the mass was tattooed with an injection of Spot (carbon black). jar 1 Findings: - Two pedunculated polyps were found in the mid transverse colon. The polyps were 8 to 11 mm in size. These polyps were removed with a hot snare. Resection and retrieval were complete. jar 2 - Five pedunculated and sessile polyps were found in the distal transverse colon. The polyps were 4 to 14 mm in size. Two were removed with hot snare, three were removed with cold snare. Resection and retrieval were complete. jar 3 -  A 20 mm polyp was found in the distal transverse colon. The polyp was pedunculated. The polyp was removed with a hot snare. Resection and retrieval were complete. jar 4 - The exam was otherwise without abnormality on direct and  retroflexion views.   10/13/2021 Pathology Results   REPORT OF SURGICAL PATHOLOGY Accession: [id number]  Diagnosis 1. Hepatic Flexure Biopsy - ADENOCARCINOMA, SEE NOTE   10/21/2021 Imaging   CT CHEST ABDOMEN PELVIS W CONTRAST  IMPRESSION: 1. There is a circumferential mass of the mid ascending colon measuring approximately 5 cm in length, in keeping with newly diagnosed primary colon malignancy. 2. Adjacent to the colon mass, there is mesenteric fat stranding suggesting invasion beyond the muscularis as well as abnormally enlarged mesocolonic lymph nodes. 3. Mildly prominent, asymmetric right external iliac lymph nodes, nonspecific although suspicious for early nodal metastatic disease. 4. Asymmetrically prominent left axillary lymph nodes, nonspecific, these nodes less favored to reflect metastatic disease given location in the absence of other evidence of metastatic disease above the diaphragm. Both external iliac and axillary lymph nodes could be characterized for abnormal metabolic activity by PET-CT. 5. No evidence of organ metastatic disease in the chest, abdomen, or pelvis. 6. Exophytic soft tissue attenuation lesion of the anterior midportion of the left kidney measuring 1.2 cm. Partially exophytic soft tissue attenuation lesion of the posteroinferior pole of the left kidney measuring 1.9 x 1.7 cm. These may reflect hemorrhagic or proteinaceous cysts but are incompletely characterized and incidental renal cell carcinoma is not excluded. Recommend initial renal ultrasound to establish definitively cystic character, multiphasic contrast enhanced CT or MRI may however be further required to exclude malignancy. 7. Prostatomegaly with thickening of the decompressed urinary bladder wall, likely due to chronic outlet obstruction.   Aortic Atherosclerosis (ICD10-I70.0).   10/28/2021 Initial Diagnosis   Colorectal cancer ([place])   11/17/2021 Cancer Staging   Staging form:  Colon and Rectum, AJCC 8th Edition - Pathologic stage from 11/17/2021: Stage IIIB (pT3, pN2a, cM0) - Signed by Truitt Merle, MD on 12/03/2021 Histologic grading system: 4 grade system Histologic grade (G): G2 Residual tumor (R): R0 - None   11/17/2021 Definitive Surgery   FINAL MICROSCOPIC DIAGNOSIS:   A. COLON, RIGHT, RESECTION:  Invasive moderately differentiated adenocarcinoma invading into  pericolic adipose (pT3)  Tumor measures 5.2 x 3.6 x 0.8 cm  Margins free of carcinoma and adenomatous change  Six of fifteen pericolic lymph nodes with metastatic carcinoma (6/15, pN2a)  Two additional tubular adenomas without high-grade dysplasia, largest measuring 1.3 cm  Benign appendix   ADDENDUM:  Mismatch Repair Protein (IHC)  SUMMARY INTERPRETATION: NORMAL  Microsatellite Instability (MSI) Result: STABLE   12/23/2021 -  Chemotherapy   Patient is on Treatment Plan : COLORECTAL FOLFOX q14d x 6 months        INTERVAL HISTORY:  George Watkins is here for a follow up of colorectal cancer. He was last seen by me on 06/09/22. He was seen in the infusion area. He reports he is doing well except for continued neuropathy. He describes it as "feeling like I'm walking on rocks or gravel."   All other systems were reviewed with the patient and are negative.  MEDICAL HISTORY:  Past Medical History:  Diagnosis Date   Anemia    Arthritis    Colon cancer ([place])    Coronary artery disease    GERD (gastroesophageal reflux disease)    Heart murmur    Hiatal hernia    Hypertension    Pneumonia  Sleep apnea     SURGICAL HISTORY: Past Surgical History:  Procedure Laterality Date   ANKLE SURGERY     CIRCUMCISION     COLONOSCOPY     HERNIA REPAIR     PORTACATH PLACEMENT N/A 12/09/2021   Procedure: INSERTION PORT-A-CATH with ultrasound;  Surgeon: Leighton Ruff, MD;  Location: [facility] ORS;  Service: General;  Laterality: N/A;    I have reviewed the social history and family history with the  patient and they are unchanged from previous note.  ALLERGIES:  is allergic to sulfa antibiotics.  MEDICATIONS:  Current Outpatient Medications  Medication Sig Dispense Refill   amLODipine (NORVASC) 5 MG tablet Take 5 mg by mouth daily.     atorvastatin (LIPITOR) 10 MG tablet Take 10 mg by mouth at bedtime.     cholecalciferol (VITAMIN D3) 25 MCG (1000 UNIT) tablet Take 1,000 Units by mouth daily. (Patient not taking: Reported on 03/23/2022)     cloNIDine (CATAPRES) 0.2 MG tablet Take 0.2 mg by mouth 2 (two) times daily.     cyclobenzaprine (FLEXERIL) 5 MG tablet Take 5 mg by mouth at bedtime as needed for muscle spasms. (Patient not taking: Reported on 03/23/2022)     ferrous sulfate 325 (65 FE) MG tablet Take 1 tablet (325 mg total) by mouth 2 (two) times daily with a meal. 30 tablet 0   hydrochlorothiazide (HYDRODIURIL) 12.5 MG tablet Take 1 tablet by mouth daily.     KLOR-CON M10 10 MEQ tablet TAKE 1 TABLET BY MOUTH TWICE A DAY 60 tablet 0   labetalol (NORMODYNE) 200 MG tablet Take 200 mg by mouth 2 (two) times daily.     lidocaine-prilocaine (EMLA) cream Apply to affected area once 30 g 3   losartan (COZAAR) 100 MG tablet Take 0.5 tablets by mouth daily.     losartan-hydrochlorothiazide (HYZAAR) 100-25 MG tablet Take 1 tablet by mouth daily.     Multiple Vitamins-Minerals (MULTIVITAMIN MEN 50+ PO) Take 1 tablet by mouth daily.     ondansetron (ZOFRAN) 8 MG tablet Take 1 tablet (8 mg total) by mouth 2 (two) times daily as needed for refractory nausea / vomiting. Start on day 3 after chemotherapy. 30 tablet 1   pantoprazole (PROTONIX) 40 MG tablet Take 40 mg by mouth daily. (Patient not taking: Reported on 03/23/2022)     prochlorperazine (COMPAZINE) 10 MG tablet Take 1 tablet (10 mg total) by mouth every 6 (six) hours as needed (Nausea or vomiting). (Patient not taking: Reported on 03/23/2022) 30 tablet 1   traMADol (ULTRAM) 50 MG tablet Take 1-2 tablets (50-100 mg total) by mouth every 6  (six) hours as needed for moderate pain. (Patient not taking: Reported on 03/23/2022) 30 tablet 0   No current facility-administered medications for this visit.   Facility-Administered Medications Ordered in Other Visits  Medication Dose Route Frequency Provider Last Rate Last Admin   fluorouracil (ADRUCIL) 5,000 mg in sodium chloride 0.9 % 150 mL chemo infusion  5,000 mg Intravenous 1 day or 1 dose Truitt Merle, MD   Infusion Verify at 06/23/22 1106   heparin lock flush 100 unit/mL  500 Units Intracatheter Once PRN Truitt Merle, MD       sodium chloride flush (NS) 0.9 % injection 10 mL  10 mL Intracatheter PRN Truitt Merle, MD   10 mL at 06/23/22 1040    PHYSICAL EXAMINATION: ECOG PERFORMANCE STATUS: 1 - Symptomatic but completely ambulatory  There were no vitals filed for this visit. Wt Readings from  Last 3 Encounters:  06/23/22 223 lb (101.2 kg)  06/09/22 213 lb 8 oz (96.8 kg)  05/19/22 212 lb (96.2 kg)     GENERAL:alert, no distress and comfortable SKIN: skin color normal, no rashes or significant lesions EYES: normal, Conjunctiva are pink and non-injected, sclera clear  NEURO: alert & oriented x 3 with fluent speech  LABORATORY DATA:  I have reviewed the data as listed    Latest Ref Rng & Units 06/23/2022    8:25 AM 06/09/2022    9:44 AM 05/19/2022   10:13 AM  CBC  WBC 4.0 - 10.5 K/uL 5.9  4.1  4.2   Hemoglobin 13.0 - 17.0 g/dL 11.1  11.4  10.8   Hematocrit 39.0 - 52.0 % 34.5  34.8  33.3   Platelets 150 - 400 K/uL 135  164  145         Latest Ref Rng & Units 06/23/2022    8:25 AM 06/09/2022    9:44 AM 05/19/2022   10:13 AM  CMP  Glucose 70 - 99 mg/dL 178  180  151   BUN 8 - 23 mg/dL 15  11  10    Creatinine 0.61 - 1.24 mg/dL 1.13  1.25  1.21   Sodium 135 - 145 mmol/L 139  138  137   Potassium 3.5 - 5.1 mmol/L 3.6  3.4  3.3   Chloride 98 - 111 mmol/L 104  102  103   CO2 22 - 32 mmol/L 31  30  30    Calcium 8.9 - 10.3 mg/dL 9.2  9.3  9.0   Total Protein 6.5 - 8.1 g/dL 6.8  7.1   7.0   Total Bilirubin 0.3 - 1.2 mg/dL 0.9  0.8  0.7   Alkaline Phos 38 - 126 U/L 82  97  128   AST 15 - 41 U/L 29  31  28    ALT 0 - 44 U/L 25  21  22        RADIOGRAPHIC STUDIES: I have personally reviewed the radiological images as listed and agreed with the findings in the report. No results found.    No orders of the defined types were placed in this encounter.  All questions were answered. The patient knows to call the clinic with any problems, questions or concerns. No barriers to learning was detected. The total time spent in the appointment was 30 minutes.     Truitt Merle, MD 06/23/2022   I, Wilburn Mylar, am acting as scribe for Truitt Merle, MD.   I have reviewed the above documentation for accuracy and completeness, and I agree with the above.

## 2022-06-24 ENCOUNTER — Telehealth: Payer: Self-pay | Admitting: Hematology

## 2022-06-24 NOTE — Telephone Encounter (Signed)
Scheduled follow-up appointment per 8/31 los. Patient is aware. Mailed calendar.

## 2022-06-25 ENCOUNTER — Other Ambulatory Visit: Payer: Self-pay

## 2022-06-25 ENCOUNTER — Inpatient Hospital Stay: Payer: Medicare PPO | Attending: Physician Assistant

## 2022-06-25 VITALS — BP 141/93 | HR 72 | Temp 97.5°F | Resp 17

## 2022-06-25 DIAGNOSIS — C182 Malignant neoplasm of ascending colon: Secondary | ICD-10-CM

## 2022-06-25 MED ORDER — HEPARIN SOD (PORK) LOCK FLUSH 100 UNIT/ML IV SOLN
500.0000 [IU] | Freq: Once | INTRAVENOUS | Status: AC | PRN
  Administered 2022-06-25: 500 [IU]

## 2022-06-25 MED ORDER — SODIUM CHLORIDE 0.9% FLUSH
10.0000 mL | INTRAVENOUS | Status: DC | PRN
  Administered 2022-06-25: 10 mL

## 2022-07-06 ENCOUNTER — Other Ambulatory Visit: Payer: Self-pay

## 2022-07-27 ENCOUNTER — Other Ambulatory Visit: Payer: Self-pay | Admitting: Hematology

## 2022-07-31 ENCOUNTER — Other Ambulatory Visit: Payer: Self-pay

## 2022-07-31 ENCOUNTER — Emergency Department (HOSPITAL_COMMUNITY)
Admission: EM | Admit: 2022-07-31 | Discharge: 2022-07-31 | Disposition: A | Discharge status: Home / Self Care | Payer: Medicare PPO | Attending: Emergency Medicine | Admitting: Emergency Medicine

## 2022-07-31 ENCOUNTER — Encounter (HOSPITAL_COMMUNITY): Payer: Self-pay

## 2022-07-31 DIAGNOSIS — Z20822 Contact with and (suspected) exposure to covid-19: Secondary | ICD-10-CM | POA: Diagnosis not present

## 2022-07-31 DIAGNOSIS — J029 Acute pharyngitis, unspecified: Secondary | ICD-10-CM | POA: Diagnosis present

## 2022-07-31 DIAGNOSIS — J069 Acute upper respiratory infection, unspecified: Secondary | ICD-10-CM | POA: Insufficient documentation

## 2022-07-31 LAB — RESP PANEL BY RT-PCR (FLU A&B, COVID) ARPGX2
Influenza A by PCR: NEGATIVE
Influenza B by PCR: NEGATIVE
SARS Coronavirus 2 by RT PCR: NEGATIVE

## 2022-07-31 MED ORDER — BENZONATATE 100 MG PO CAPS: 100.0000 mg | ORAL_CAPSULE | Freq: Three times a day (TID) | ORAL | 0 refills | Status: DC

## 2022-07-31 NOTE — Discharge Instructions (Addendum)
The medication I sent to your pharmacy is for your cough.  Take it as prescribed as needed.  Tylenol cold and flu will likely be a better option than plain Tylenol for your symptoms.  I will do better for the body aches, sore throat and chills.  Please follow-up with your PCP this week if you are not getting better by Thursday.  Return with any worsening symptoms, especially shortness of breath or chest pain.  It was a pleasure to meet you and I hope you feel better!  Look out for phone call about your test results.

## 2022-07-31 NOTE — ED Triage Notes (Signed)
Pt reports fever (100.0 @ home), sore throat, body aches, chills, headache x 3 days.  Tylenol @ 0730.

## 2022-07-31 NOTE — ED Provider Notes (Addendum)
[facility] DEPT Provider Note   CSN: [id number] Arrival date & time: 07/31/22  8016     History  Chief Complaint  Patient presents with   Sore Throat    George Watkins is a 71 y.o. male presenting with viral symptoms since Thursday.  On Friday he went to urgent care and tested negative for COVID, flu and strep.    Sore Throat Pertinent negatives include no chest pain and no shortness of breath.       Home Medications Prior to Admission medications   Medication Sig Start Date End Date Taking? Authorizing Provider  amLODipine (NORVASC) 5 MG tablet Take 5 mg by mouth daily. 08/06/20   [provider]  atorvastatin (LIPITOR) 10 MG tablet Take 10 mg by mouth at bedtime.    [provider]  cholecalciferol (VITAMIN D3) 25 MCG (1000 UNIT) tablet Take 1,000 Units by mouth daily. Patient not taking: Reported on 03/23/2022    [provider]  cloNIDine (CATAPRES) 0.2 MG tablet Take 0.2 mg by mouth 2 (two) times daily.    [provider]  cyclobenzaprine (FLEXERIL) 5 MG tablet Take 5 mg by mouth at bedtime as needed for muscle spasms. Patient not taking: Reported on 03/23/2022    [provider]  ferrous sulfate 325 (65 FE) MG tablet Take 1 tablet (325 mg total) by mouth 2 (two) times daily with a meal. 05/22/20   Sherwood Gambler, MD  hydrochlorothiazide (HYDRODIURIL) 12.5 MG tablet Take 1 tablet by mouth daily.    [provider]  KLOR-CON M10 10 MEQ tablet TAKE 1 TABLET BY MOUTH TWICE A DAY 07/27/22   Truitt Merle, MD  labetalol (NORMODYNE) 200 MG tablet Take 200 mg by mouth 2 (two) times daily.    [provider]  lidocaine-prilocaine (EMLA) cream Apply to affected area once 12/03/21   Truitt Merle, MD  losartan (COZAAR) 100 MG tablet Take 0.5 tablets by mouth daily.    [provider]  losartan-hydrochlorothiazide (HYZAAR) 100-25 MG tablet Take 1 tablet by mouth daily.    [provider]  Multiple Vitamins-Minerals (MULTIVITAMIN MEN 50+ PO) Take 1 tablet by mouth daily.    [provider]  ondansetron (ZOFRAN) 8 MG tablet Take 1 tablet (8 mg total) by mouth 2 (two) times daily as needed for refractory nausea / vomiting. Start on day 3 after chemotherapy. 12/03/21   Truitt Merle, MD  pantoprazole (PROTONIX) 40 MG tablet Take 40 mg by mouth daily. Patient not taking: Reported on 03/23/2022 06/11/20   [provider]  prochlorperazine (COMPAZINE) 10 MG tablet Take 1 tablet (10 mg total) by mouth every 6 (six) hours as needed (Nausea or vomiting). Patient not taking: Reported on 03/23/2022 12/03/21   Truitt Merle, MD  traMADol (ULTRAM) 50 MG tablet Take 1-2 tablets (50-100 mg total) by mouth every 6 (six) hours as needed for moderate pain. Patient not taking: Reported on 03/23/2022 5/53/74   Leighton Ruff, MD      Allergies    Sulfa antibiotics    Review of Systems   Review of Systems  Constitutional:  Positive for chills.  HENT:  Positive for sore throat.   Respiratory:  Positive for cough. Negative for shortness of breath.   Cardiovascular:  Negative for chest pain and palpitations.  Gastrointestinal:  Positive for diarrhea.  Musculoskeletal:  Positive for myalgias.    Physical Exam Updated Vital Signs BP (!) 142/92 (BP Location: Left Arm)   Pulse 93  Temp 98.5 F (36.9 C) (Oral)   Resp 18   Ht 5' 9.5" (1.765 m)   Wt 102.1 kg   SpO2 100%   BMI 32.75 kg/m  Physical Exam Vitals and nursing note reviewed.  Constitutional:      General: He is not in acute distress.    Appearance: Normal appearance. He is not ill-appearing.  HENT:     Head: Normocephalic and atraumatic.     Mouth/Throat:     Mouth: Mucous membranes are moist.     Pharynx: Oropharynx is clear. Uvula midline. Posterior oropharyngeal erythema present. No pharyngeal swelling.  Eyes:     General: No scleral icterus.    Conjunctiva/sclera: Conjunctivae normal.   Cardiovascular:     Rate and Rhythm: Normal rate and regular rhythm.  Pulmonary:     Effort: Pulmonary effort is normal. No respiratory distress.     Breath sounds: No wheezing or rales.  Skin:    General: Skin is warm and dry.     Findings: No rash.  Neurological:     Mental Status: He is alert.  Psychiatric:        Mood and Affect: Mood normal.     ED Results / Procedures / Treatments   Labs (all labs ordered are listed, but only abnormal results are displayed) Labs Reviewed  RESP PANEL BY RT-PCR (FLU A&B, COVID) [id number]    EKG None  Radiology No results found.  Procedures Procedures   Medications Ordered in ED Medications - No data to display  ED Course/ Medical Decision Making/ A&P                           Medical Decision Making Risk Prescription drug management.   71 year old male presenting today with viral symptoms.  Have been going on since Thursday.  Recently tested negative for COVID, flu and strep.  Today he was swabbed for COVID and the flu.  This is pending at this time.  I gave the patient the option to be discharged home and receive a phone call from me about his results.  He said he would like to do this.  He likely would not qualify for Paxlovid or other antiviral treatment due to his statin.  He is well-appearing, lung sounds are clear and without any evidence of severe disease.   Final Clinical Impression(s) / ED Diagnoses Final diagnoses:  Viral upper respiratory tract infection    Rx / DC Orders ED Discharge Orders          Ordered    benzonatate (TESSALON) 100 MG capsule  Every 8 hours        07/31/22 0953           Results and diagnoses were explained to the patient. Return precautions discussed in full. Patient had no additional questions and expressed complete understanding.   This chart was dictated using voice recognition software.  Despite best efforts to proofread,  errors can occur which can change the documentation  meaning.    Darliss Ridgel 07/31/22 7616  10:29a: Pt called and notified that covid & flu are both negative   Rhae Hammock, PA-C 07/31/22 [place]    Hayden Rasmussen, MD 07/31/22 1749

## 2022-08-11 ENCOUNTER — Other Ambulatory Visit: Payer: Self-pay | Admitting: Hematology

## 2022-08-18 ENCOUNTER — Other Ambulatory Visit: Payer: Self-pay

## 2022-08-18 ENCOUNTER — Inpatient Hospital Stay: Payer: Medicare PPO

## 2022-08-18 ENCOUNTER — Encounter: Payer: Self-pay | Admitting: Hematology

## 2022-08-18 ENCOUNTER — Inpatient Hospital Stay: Payer: Medicare PPO | Attending: Physician Assistant | Admitting: Hematology

## 2022-08-18 VITALS — BP 134/80 | HR 57 | Temp 98.2°F | Resp 18 | Ht 69.5 in | Wt 222.3 lb

## 2022-08-18 DIAGNOSIS — I10 Essential (primary) hypertension: Secondary | ICD-10-CM | POA: Diagnosis not present

## 2022-08-18 DIAGNOSIS — D509 Iron deficiency anemia, unspecified: Secondary | ICD-10-CM | POA: Diagnosis not present

## 2022-08-18 DIAGNOSIS — C779 Secondary and unspecified malignant neoplasm of lymph node, unspecified: Secondary | ICD-10-CM | POA: Insufficient documentation

## 2022-08-18 DIAGNOSIS — C182 Malignant neoplasm of ascending colon: Secondary | ICD-10-CM

## 2022-08-18 DIAGNOSIS — G629 Polyneuropathy, unspecified: Secondary | ICD-10-CM | POA: Insufficient documentation

## 2022-08-18 DIAGNOSIS — C19 Malignant neoplasm of rectosigmoid junction: Secondary | ICD-10-CM

## 2022-08-18 DIAGNOSIS — Z95828 Presence of other vascular implants and grafts: Secondary | ICD-10-CM

## 2022-08-18 LAB — CBC WITH DIFFERENTIAL (CANCER CENTER ONLY)
Abs Immature Granulocytes: 0.02 10*3/uL (ref 0.00–0.07)
Basophils Absolute: 0.1 10*3/uL (ref 0.0–0.1)
Basophils Relative: 1 %
Eosinophils Absolute: 0.2 10*3/uL (ref 0.0–0.5)
Eosinophils Relative: 2 %
HCT: 34.1 % — ABNORMAL LOW (ref 39.0–52.0)
Hemoglobin: 10.9 g/dL — ABNORMAL LOW (ref 13.0–17.0)
Immature Granulocytes: 0 %
Lymphocytes Relative: 26 %
Lymphs Abs: 1.9 10*3/uL (ref 0.7–4.0)
MCH: 28.6 pg (ref 26.0–34.0)
MCHC: 32 g/dL (ref 30.0–36.0)
MCV: 89.5 fL (ref 80.0–100.0)
Monocytes Absolute: 0.8 10*3/uL (ref 0.1–1.0)
Monocytes Relative: 12 %
Neutro Abs: 4.4 10*3/uL (ref 1.7–7.7)
Neutrophils Relative %: 59 %
Platelet Count: 250 10*3/uL (ref 150–400)
RBC: 3.81 MIL/uL — ABNORMAL LOW (ref 4.22–5.81)
RDW: 13.6 % (ref 11.5–15.5)
WBC Count: 7.3 10*3/uL (ref 4.0–10.5)
nRBC: 0 % (ref 0.0–0.2)

## 2022-08-18 LAB — CMP (CANCER CENTER ONLY)
ALT: 12 U/L (ref 0–44)
AST: 19 U/L (ref 15–41)
Albumin: 3.5 g/dL (ref 3.5–5.0)
Alkaline Phosphatase: 76 U/L (ref 38–126)
Anion gap: 4 — ABNORMAL LOW (ref 5–15)
BUN: 14 mg/dL (ref 8–23)
CO2: 32 mmol/L (ref 22–32)
Calcium: 9.5 mg/dL (ref 8.9–10.3)
Chloride: 102 mmol/L (ref 98–111)
Creatinine: 1.22 mg/dL (ref 0.61–1.24)
GFR, Estimated: >60 mL/min (ref >60)
Glucose, Bld: 90 mg/dL (ref 70–99)
Potassium: 3.6 mmol/L (ref 3.5–5.1)
Sodium: 138 mmol/L (ref 135–145)
Total Bilirubin: 0.8 mg/dL (ref 0.3–1.2)
Total Protein: 8.9 g/dL — ABNORMAL HIGH (ref 6.5–8.1)

## 2022-08-18 LAB — CEA (IN HOUSE-CHCC): CEA (CHCC-In House): <1 ng/mL (ref 0.00–5.00)

## 2022-08-18 MED ORDER — SODIUM CHLORIDE 0.9% FLUSH
10.0000 mL | Freq: Once | INTRAVENOUS | Status: AC
  Administered 2022-08-18: 10 mL

## 2022-08-18 MED ORDER — HEPARIN SOD (PORK) LOCK FLUSH 100 UNIT/ML IV SOLN
500.0000 [IU] | Freq: Once | INTRAVENOUS | Status: AC
  Administered 2022-08-18: 500 [IU] via INTRAVENOUS

## 2022-08-18 NOTE — Progress Notes (Signed)
[place]   [phone] [phone]   Clinic Follow up Note   Patient Care Team: Arthur Holms, NP as PCP - General (Nurse Practitioner) Truitt Merle, MD as Consulting Physician (Oncology)  Date of Service:  08/18/2022  CHIEF COMPLAINT: f/u of colorectal cancer  CURRENT THERAPY:  Adjuvant FOLFOX, started 12/23/21             -oxali d/c after C10  ASSESSMENT & PLAN:  George Watkins is a 71 y.o. male with   1. Colorectal cancer, adenocarcinoma of the ascending colon, pT3N2aMx, at least stage IIIB, MSS -Colonoscopy performed on 10/13/21 for IDA and abdominal pain showed 4 cm mass near hepatic flexure. Path confirmed adenocarcinoma. -Baseline CEA was elevated at 25.9 on 10/13/21 -PET scan on 11/09/21 showed: hypermetabolism to colon mass and adjacent mildly enlarged ileocolonic mesenteric lymph nodes. Otherwise, no definitive evidence of additional spread. -partial colectomy on 11/17/21 with Dr. Marcello Moores showed 5.6 cm invasive moderately differentiated adenocarcinoma invading into pericolic adipose. Margins negative, but metastatic carcinoma in 6 lymph nodes (6/15). Two additional tubular adenomas without high-grade dysplasia noted. MMR normal and MSI stable.  -given his multiple (6) positive lymph nodes, he completed 12 cycles adjuvant chemotherapy with FOLFOX 12/23/21 - 06/23/22. He tolerated well overall with mild side effects. 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 -restaging CT AP 05/26/22 showed stable disease, no signs of recurrence. Plan for repeat in 11/2022. -labs reviewed, overall stable.  He is clinically doing well, no concern for recurrence.  We will continue cancer surveillance.   2. Neuropathy, G1-2 -He developed neuropathy and tuning fork exam on 06/09/22 was significantly decreased. Oxali discontinued after C10. -he reports persistent numbness and tingling in his fingers and toes, which is bothersome but does not affect his function  much. -he tells me he was placed on a medicine recently by his PCP; he will call me with the name of the medicine.   3. Iron deficiency anemia  -currently taking ferrous sulfate BID, has had good response. -Hgb stable at 10.9 today (08/18/22), which is low despite being off chemo for two months.     PLAN: -port flush in 6 weeks -f/u week of 11/28/22, with lab/flush and CT several days before   No problem-specific Assessment & Plan notes found for this encounter.   SUMMARY OF ONCOLOGIC HISTORY: Oncology History Overview Note   Cancer Staging  Colorectal cancer [facility]) Staging form: Colon and Rectum, AJCC 8th Edition - Pathologic stage from 11/17/2021: Stage IIIB (pT3, pN2a, cM0) - Signed by Truitt Merle, MD on 12/03/2021    Cancer of right colon [facility])  10/13/2021 Procedure   Upper GI endoscopy by Dr. Ardis Hughs for IDA and abdominal pain  Findings:  - The esophagus was normal. - The stomach was normal. - The examined duodenum was normal.   10/13/2021 Procedure   Colonoscopy Indications: Iron deficiency  - A fungating and ulcerated partially obstructing, clearly malignant mass was found at the hepatic flexure. The mass was circumferential and measured 4cm in length. Biopsies were taken with a cold forceps for histology and then the mucosa just distal to the mass was tattooed with an injection of Spot (carbon black). jar 1 Findings: - Two pedunculated polyps were found in the mid transverse colon. The polyps were 8 to 11 mm in size. These polyps were removed with a hot snare. Resection and retrieval were complete. jar 2 - Five pedunculated and sessile polyps were found in the distal transverse colon.  The polyps were 4 to 14 mm in size. Two were removed with hot snare, three were removed with cold snare. Resection and retrieval were complete. jar 3 - A 20 mm polyp was found in the distal transverse colon. The polyp was pedunculated. The polyp was removed with a hot snare. Resection  and retrieval were complete. jar 4 - The exam was otherwise without abnormality on direct and retroflexion views.   10/13/2021 Pathology Results   REPORT OF SURGICAL PATHOLOGY Accession: [id number]  Diagnosis 1. Hepatic Flexure Biopsy - ADENOCARCINOMA, SEE NOTE   10/21/2021 Imaging   CT CHEST ABDOMEN PELVIS W CONTRAST  IMPRESSION: 1. There is a circumferential mass of the mid ascending colon measuring approximately 5 cm in length, in keeping with newly diagnosed primary colon malignancy. 2. Adjacent to the colon mass, there is mesenteric fat stranding suggesting invasion beyond the muscularis as well as abnormally enlarged mesocolonic lymph nodes. 3. Mildly prominent, asymmetric right external iliac lymph nodes, nonspecific although suspicious for early nodal metastatic disease. 4. Asymmetrically prominent left axillary lymph nodes, nonspecific, these nodes less favored to reflect metastatic disease given location in the absence of other evidence of metastatic disease above the diaphragm. Both external iliac and axillary lymph nodes could be characterized for abnormal metabolic activity by PET-CT. 5. No evidence of organ metastatic disease in the chest, abdomen, or pelvis. 6. Exophytic soft tissue attenuation lesion of the anterior midportion of the left kidney measuring 1.2 cm. Partially exophytic soft tissue attenuation lesion of the posteroinferior pole of the left kidney measuring 1.9 x 1.7 cm. These may reflect hemorrhagic or proteinaceous cysts but are incompletely characterized and incidental renal cell carcinoma is not excluded. Recommend initial renal ultrasound to establish definitively cystic character, multiphasic contrast enhanced CT or MRI may however be further required to exclude malignancy. 7. Prostatomegaly with thickening of the decompressed urinary bladder wall, likely due to chronic outlet obstruction.   Aortic Atherosclerosis (ICD10-I70.0).   10/28/2021  Initial Diagnosis   Colorectal cancer ([place])   11/17/2021 Cancer Staging   Staging form: Colon and Rectum, AJCC 8th Edition - Pathologic stage from 11/17/2021: Stage IIIB (pT3, pN2a, cM0) - Signed by Truitt Merle, MD on 12/03/2021 Histologic grading system: 4 grade system Histologic grade (G): G2 Residual tumor (R): R0 - None   11/17/2021 Definitive Surgery   FINAL MICROSCOPIC DIAGNOSIS:   A. COLON, RIGHT, RESECTION:  Invasive moderately differentiated adenocarcinoma invading into  pericolic adipose (pT3)  Tumor measures 5.2 x 3.6 x 0.8 cm  Margins free of carcinoma and adenomatous change  Six of fifteen pericolic lymph nodes with metastatic carcinoma (6/15, pN2a)  Two additional tubular adenomas without high-grade dysplasia, largest measuring 1.3 cm  Benign appendix   ADDENDUM:  Mismatch Repair Protein (IHC)  SUMMARY INTERPRETATION: NORMAL  Microsatellite Instability (MSI) Result: STABLE   12/23/2021 -  Chemotherapy   Patient is on Treatment Plan : COLORECTAL FOLFOX q14d x 6 months        INTERVAL HISTORY:  LOWEN BARRINGER is here for a follow up of colorectal cancer. He was last seen by me on 06/23/22. He presents to the clinic alone. He reports he is doing well overall except for persistent numbness and tingling in his fingers and toes. He explains they feel tight. He rates it 10/10, but he is functioning relatively well-- he denies falls or sleep disturbances, and notes he has only dropped things a few times. He adds that his PCP put him on a medication recently for the  neuropathy.   All other systems were reviewed with the patient and are negative.  MEDICAL HISTORY:  Past Medical History:  Diagnosis Date   Anemia    Arthritis    Colon cancer ([place])    Coronary artery disease    GERD (gastroesophageal reflux disease)    Heart murmur    Hiatal hernia    Hypertension    Pneumonia    Sleep apnea     SURGICAL HISTORY: Past Surgical History:  Procedure Laterality Date    ANKLE SURGERY     CIRCUMCISION     COLONOSCOPY     HERNIA REPAIR     PORTACATH PLACEMENT N/A 12/09/2021   Procedure: INSERTION PORT-A-CATH with ultrasound;  Surgeon: Leighton Ruff, MD;  Location: [facility];  Service: General;  Laterality: N/A;    I have reviewed the social history and family history with the patient and they are unchanged from previous note.  ALLERGIES:  is allergic to sulfa antibiotics.  MEDICATIONS:  Current Outpatient Medications  Medication Sig Dispense Refill   amLODipine (NORVASC) 5 MG tablet Take 5 mg by mouth daily.     atorvastatin (LIPITOR) 10 MG tablet Take 10 mg by mouth at bedtime.     cholecalciferol (VITAMIN D3) 25 MCG (1000 UNIT) tablet Take 1,000 Units by mouth daily. (Patient not taking: Reported on 03/23/2022)     cloNIDine (CATAPRES) 0.2 MG tablet Take 0.2 mg by mouth 2 (two) times daily.     cyclobenzaprine (FLEXERIL) 5 MG tablet Take 5 mg by mouth at bedtime as needed for muscle spasms. (Patient not taking: Reported on 03/23/2022)     ferrous sulfate 325 (65 FE) MG tablet Take 1 tablet (325 mg total) by mouth 2 (two) times daily with a meal. 30 tablet 0   hydrochlorothiazide (HYDRODIURIL) 12.5 MG tablet Take 1 tablet by mouth daily.     KLOR-CON M10 10 MEQ tablet TAKE 1 TABLET BY MOUTH TWICE A DAY 180 tablet 1   labetalol (NORMODYNE) 200 MG tablet Take 200 mg by mouth 2 (two) times daily.     lidocaine-prilocaine (EMLA) cream Apply to affected area once 30 g 3   losartan (COZAAR) 100 MG tablet Take 0.5 tablets by mouth daily.     losartan-hydrochlorothiazide (HYZAAR) 100-25 MG tablet Take 1 tablet by mouth daily.     Multiple Vitamins-Minerals (MULTIVITAMIN MEN 50+ PO) Take 1 tablet by mouth daily.     No current facility-administered medications for this visit.    PHYSICAL EXAMINATION: ECOG PERFORMANCE STATUS: 1 - Symptomatic but completely ambulatory  Vitals:   08/18/22 1126  BP: 134/80  Pulse: (!) 57  Resp: 18  Temp: 98.2 F (36.8 C)   SpO2: 98%   Wt Readings from Last 3 Encounters:  08/18/22 222 lb 4.8 oz (100.8 kg)  07/31/22 225 lb (102.1 kg)  06/23/22 223 lb (101.2 kg)     GENERAL:alert, no distress and comfortable SKIN: skin color, texture, turgor are normal, no rashes or significant lesions EYES: normal, Conjunctiva are pink and non-injected, sclera clear  LUNGS: clear to auscultation and percussion with normal breathing effort HEART: regular rate & rhythm and no murmurs and no lower extremity edema ABDOMEN:abdomen soft, non-tender and normal bowel sounds Musculoskeletal:no cyanosis of digits and no clubbing  NEURO: alert & oriented x 3 with fluent speech, no focal motor/sensory deficits  LABORATORY DATA:  I have reviewed the data as listed    Latest Ref Rng & Units 08/18/2022   10:56 AM 06/23/2022  8:25 AM 06/09/2022    9:44 AM  CBC  WBC 4.0 - 10.5 K/uL 7.3  5.9  4.1   Hemoglobin 13.0 - 17.0 g/dL 10.9  11.1  11.4   Hematocrit 39.0 - 52.0 % 34.1  34.5  34.8   Platelets 150 - 400 K/uL 250  135  164         Latest Ref Rng & Units 08/18/2022   10:56 AM 06/23/2022    8:25 AM 06/09/2022    9:44 AM  CMP  Glucose 70 - 99 mg/dL 90  178  180   BUN 8 - 23 mg/dL 14  15  11    Creatinine 0.61 - 1.24 mg/dL 1.22  1.13  1.25   Sodium 135 - 145 mmol/L 138  139  138   Potassium 3.5 - 5.1 mmol/L 3.6  3.6  3.4   Chloride 98 - 111 mmol/L 102  104  102   CO2 22 - 32 mmol/L 32  31  30   Calcium 8.9 - 10.3 mg/dL 9.5  9.2  9.3   Total Protein 6.5 - 8.1 g/dL 8.9  6.8  7.1   Total Bilirubin 0.3 - 1.2 mg/dL 0.8  0.9  0.8   Alkaline Phos 38 - 126 U/L 76  82  97   AST 15 - 41 U/L 19  29  31    ALT 0 - 44 U/L 12  25  21        RADIOGRAPHIC STUDIES: I have personally reviewed the radiological images as listed and agreed with the findings in the report. No results found.    Orders Placed This Encounter  Procedures   CT CHEST ABDOMEN PELVIS W CONTRAST    Standing Status:   Future    Standing Expiration Date:    08/19/2023    Order Specific Question:   Preferred imaging location?    Answer:   [facility]    Order Specific Question:   Release to patient    Answer:   Immediate    Order Specific Question:   Is Oral Contrast requested for this exam?    Answer:   Yes, Per Radiology protocol   All questions were answered. The patient knows to call the clinic with any problems, questions or concerns. No barriers to learning was detected. The total time spent in the appointment was 30 minutes.     Truitt Merle, MD 08/18/2022   I, Wilburn Mylar, am acting as scribe for Truitt Merle, MD.   I have reviewed the above documentation for accuracy and completeness, and I agree with the above.

## 2022-08-19 ENCOUNTER — Other Ambulatory Visit: Payer: Self-pay

## 2022-08-22 ENCOUNTER — Other Ambulatory Visit: Payer: Self-pay

## 2022-09-29 ENCOUNTER — Other Ambulatory Visit: Payer: Self-pay

## 2022-09-29 ENCOUNTER — Inpatient Hospital Stay: Payer: Medicare PPO | Attending: Physician Assistant

## 2022-09-29 DIAGNOSIS — C182 Malignant neoplasm of ascending colon: Secondary | ICD-10-CM | POA: Diagnosis present

## 2022-09-29 DIAGNOSIS — D509 Iron deficiency anemia, unspecified: Secondary | ICD-10-CM | POA: Insufficient documentation

## 2022-09-29 DIAGNOSIS — Z95828 Presence of other vascular implants and grafts: Secondary | ICD-10-CM

## 2022-09-29 DIAGNOSIS — G629 Polyneuropathy, unspecified: Secondary | ICD-10-CM | POA: Diagnosis not present

## 2022-09-29 DIAGNOSIS — C19 Malignant neoplasm of rectosigmoid junction: Secondary | ICD-10-CM

## 2022-09-29 LAB — CBC WITH DIFFERENTIAL (CANCER CENTER ONLY)
Abs Immature Granulocytes: 0.01 10*3/uL (ref 0.00–0.07)
Basophils Absolute: 0 10*3/uL (ref 0.0–0.1)
Basophils Relative: 1 %
Eosinophils Absolute: 0.2 10*3/uL (ref 0.0–0.5)
Eosinophils Relative: 3 %
HCT: 39.9 % (ref 39.0–52.0)
Hemoglobin: 12.4 g/dL — ABNORMAL LOW (ref 13.0–17.0)
Immature Granulocytes: 0 %
Lymphocytes Relative: 29 %
Lymphs Abs: 1.6 10*3/uL (ref 0.7–4.0)
MCH: 27.7 pg (ref 26.0–34.0)
MCHC: 31.1 g/dL (ref 30.0–36.0)
MCV: 89.1 fL (ref 80.0–100.0)
Monocytes Absolute: 0.7 10*3/uL (ref 0.1–1.0)
Monocytes Relative: 12 %
Neutro Abs: 3.2 10*3/uL (ref 1.7–7.7)
Neutrophils Relative %: 55 %
Platelet Count: 199 10*3/uL (ref 150–400)
RBC: 4.48 MIL/uL (ref 4.22–5.81)
RDW: 13.1 % (ref 11.5–15.5)
WBC Count: 5.7 10*3/uL (ref 4.0–10.5)
nRBC: 0 % (ref 0.0–0.2)

## 2022-09-29 LAB — CMP (CANCER CENTER ONLY)
ALT: 16 U/L (ref 0–44)
AST: 19 U/L (ref 15–41)
Albumin: 3.8 g/dL (ref 3.5–5.0)
Alkaline Phosphatase: 74 U/L (ref 38–126)
Anion gap: 5 (ref 5–15)
BUN: 11 mg/dL (ref 8–23)
CO2: 30 mmol/L (ref 22–32)
Calcium: 9.6 mg/dL (ref 8.9–10.3)
Chloride: 105 mmol/L (ref 98–111)
Creatinine: 1.12 mg/dL (ref 0.61–1.24)
GFR, Estimated: >60 mL/min (ref >60)
Glucose, Bld: 125 mg/dL — ABNORMAL HIGH (ref 70–99)
Potassium: 3.4 mmol/L — ABNORMAL LOW (ref 3.5–5.1)
Sodium: 140 mmol/L (ref 135–145)
Total Bilirubin: 0.8 mg/dL (ref 0.3–1.2)
Total Protein: 8 g/dL (ref 6.5–8.1)

## 2022-09-29 LAB — CEA (IN HOUSE-CHCC): CEA (CHCC-In House): <1 ng/mL (ref 0.00–5.00)

## 2022-09-29 MED ORDER — SODIUM CHLORIDE 0.9% FLUSH
10.0000 mL | Freq: Once | INTRAVENOUS | Status: AC
  Administered 2022-09-29: 10 mL

## 2022-10-03 ENCOUNTER — Telehealth: Payer: Self-pay | Admitting: *Deleted

## 2022-10-03 ENCOUNTER — Telehealth: Payer: Self-pay

## 2022-10-03 NOTE — Telephone Encounter (Signed)
Per Dr.Feng, called pt with message below and gave pt examples of K+ rich foods. Pt verbalized understanding.

## 2022-10-03 NOTE — Telephone Encounter (Signed)
-----   Message from Truitt Merle, MD sent at 10/03/2022  8:34 AM EST ----- Please let pt know his K was little low last week, the rest of lab were good, no worries. Encourage him to have K rich food, thanks   [employer]

## 2022-10-03 NOTE — Telephone Encounter (Addendum)
Called patient made him aware of message below. Patient voiced understanding    ----- Message from Truitt Merle, MD sent at 10/03/2022  8:34 AM EST ----- Please let pt know his K was little low last week, the rest of lab were good, no worries. Encourage him to have K rich food, thanks   [employer]

## 2022-11-07 ENCOUNTER — Telehealth: Payer: Self-pay | Admitting: Medical Oncology

## 2022-11-07 NOTE — Telephone Encounter (Signed)
MTG-015 - Tissue and Bodily Fluids: Translational Medicine: Discovery and Evaluation of Biomarkers/Pharmacogenomics for the Diagnosis and Personalized Management of Patients    Outgoing call: [phone]  Call to patient for study one-year follow-up. I introduced myself to patient and informed him of the reason for the call. Study history follow-up questions reviewed, as well as medication lists. Patient denies having any new medical conditions and no resolution of previously reported conditions. Patient denies any new cancer diagnosis and no documented progression. Medication list reviewed: patient is not taking pantoprazole (reported stopped 08/18/2022) and has started Klor-Con M10, 10 MEQ, daily on 06/22/2022 as well as a multivitamin, 1 tablet daily starting 10/29/2021.  I inquired with patient whether he wished for the study provided gift card to be mailed to him or provided to him when he is in clinic on 2/8 for a lab appointment. Patient stated he would prefer to have it on the 8th, when in clinic.  Patient denies any questions at this time.   I thanked patient for his time and his contribution to the study and encouraged him to call Dr. Burr Medico or research with any questions.   Maxwell Marion, RN, BSN, [facility] Nurse Lead 11/07/2022 4:06 PM

## 2022-11-08 ENCOUNTER — Other Ambulatory Visit: Payer: Self-pay | Admitting: Medical Oncology

## 2022-12-01 ENCOUNTER — Ambulatory Visit (HOSPITAL_COMMUNITY)
Admission: RE | Admit: 2022-12-01 | Discharge: 2022-12-01 | Disposition: A | Discharge status: Home / Self Care | Payer: Medicare PPO | Source: Ambulatory Visit | Attending: Hematology | Admitting: Hematology

## 2022-12-01 ENCOUNTER — Inpatient Hospital Stay: Payer: Medicare PPO | Attending: Physician Assistant

## 2022-12-01 ENCOUNTER — Inpatient Hospital Stay: Payer: Medicare PPO

## 2022-12-01 ENCOUNTER — Encounter (HOSPITAL_COMMUNITY): Payer: Self-pay

## 2022-12-01 ENCOUNTER — Encounter: Payer: Self-pay | Admitting: Hematology

## 2022-12-01 ENCOUNTER — Other Ambulatory Visit: Payer: Self-pay

## 2022-12-01 DIAGNOSIS — Z95828 Presence of other vascular implants and grafts: Secondary | ICD-10-CM

## 2022-12-01 DIAGNOSIS — C182 Malignant neoplasm of ascending colon: Secondary | ICD-10-CM | POA: Insufficient documentation

## 2022-12-01 DIAGNOSIS — I10 Essential (primary) hypertension: Secondary | ICD-10-CM | POA: Insufficient documentation

## 2022-12-01 DIAGNOSIS — C19 Malignant neoplasm of rectosigmoid junction: Secondary | ICD-10-CM | POA: Insufficient documentation

## 2022-12-01 DIAGNOSIS — R109 Unspecified abdominal pain: Secondary | ICD-10-CM | POA: Diagnosis not present

## 2022-12-01 DIAGNOSIS — G629 Polyneuropathy, unspecified: Secondary | ICD-10-CM | POA: Diagnosis not present

## 2022-12-01 LAB — CBC WITH DIFFERENTIAL (CANCER CENTER ONLY)
Abs Immature Granulocytes: 0.02 10*3/uL (ref 0.00–0.07)
Basophils Absolute: 0 10*3/uL (ref 0.0–0.1)
Basophils Relative: 1 %
Eosinophils Absolute: 0.2 10*3/uL (ref 0.0–0.5)
Eosinophils Relative: 3 %
HCT: 37.2 % — ABNORMAL LOW (ref 39.0–52.0)
Hemoglobin: 12.1 g/dL — ABNORMAL LOW (ref 13.0–17.0)
Immature Granulocytes: 0 %
Lymphocytes Relative: 32 %
Lymphs Abs: 1.8 10*3/uL (ref 0.7–4.0)
MCH: 28.3 pg (ref 26.0–34.0)
MCHC: 32.5 g/dL (ref 30.0–36.0)
MCV: 87.1 fL (ref 80.0–100.0)
Monocytes Absolute: 0.7 10*3/uL (ref 0.1–1.0)
Monocytes Relative: 13 %
Neutro Abs: 3 10*3/uL (ref 1.7–7.7)
Neutrophils Relative %: 51 %
Platelet Count: 201 10*3/uL (ref 150–400)
RBC: 4.27 MIL/uL (ref 4.22–5.81)
RDW: 13.1 % (ref 11.5–15.5)
WBC Count: 5.7 10*3/uL (ref 4.0–10.5)
nRBC: 0 % (ref 0.0–0.2)

## 2022-12-01 LAB — CMP (CANCER CENTER ONLY)
ALT: 23 U/L (ref 0–44)
AST: 23 U/L (ref 15–41)
Albumin: 3.5 g/dL (ref 3.5–5.0)
Alkaline Phosphatase: 86 U/L (ref 38–126)
Anion gap: 3 — ABNORMAL LOW (ref 5–15)
BUN: 12 mg/dL (ref 8–23)
CO2: 31 mmol/L (ref 22–32)
Calcium: 9.1 mg/dL (ref 8.9–10.3)
Chloride: 103 mmol/L (ref 98–111)
Creatinine: 1.11 mg/dL (ref 0.61–1.24)
GFR, Estimated: >60 mL/min (ref >60)
Glucose, Bld: 90 mg/dL (ref 70–99)
Potassium: 3.5 mmol/L (ref 3.5–5.1)
Sodium: 137 mmol/L (ref 135–145)
Total Bilirubin: 0.7 mg/dL (ref 0.3–1.2)
Total Protein: 6.9 g/dL (ref 6.5–8.1)

## 2022-12-01 LAB — CEA (IN HOUSE-CHCC): CEA (CHCC-In House): <1 ng/mL (ref 0.00–5.00)

## 2022-12-01 MED ORDER — HEPARIN SOD (PORK) LOCK FLUSH 100 UNIT/ML IV SOLN
INTRAVENOUS | Status: AC
  Filled 2022-12-01: qty 5

## 2022-12-01 MED ORDER — IOHEXOL 300 MG/ML  SOLN
100.0000 mL | Freq: Once | INTRAMUSCULAR | Status: AC | PRN
  Administered 2022-12-01: 100 mL via INTRAVENOUS

## 2022-12-01 MED ORDER — HEPARIN SOD (PORK) LOCK FLUSH 100 UNIT/ML IV SOLN
500.0000 [IU] | Freq: Once | INTRAVENOUS | Status: AC
  Administered 2022-12-01: 500 [IU] via INTRAVENOUS

## 2022-12-01 MED ORDER — SODIUM CHLORIDE (PF) 0.9 % IJ SOLN
INTRAMUSCULAR | Status: AC
  Filled 2022-12-01: qty 50

## 2022-12-01 MED ORDER — IOHEXOL 9 MG/ML PO SOLN
500.0000 mL | ORAL | Status: AC
  Administered 2022-12-01 (×2): 500 mL via ORAL

## 2022-12-01 MED ORDER — SODIUM CHLORIDE 0.9% FLUSH: 10.0000 mL | Freq: Once | INTRAVENOUS | Status: DC

## 2022-12-05 ENCOUNTER — Telehealth: Payer: Self-pay

## 2022-12-05 ENCOUNTER — Other Ambulatory Visit: Payer: Self-pay

## 2022-12-05 MED ORDER — PREDNISONE 5 MG PO TABS: 20.0000 mg | ORAL_TABLET | Freq: Every day | ORAL | 0 refills | Status: AC

## 2022-12-05 NOTE — Telephone Encounter (Signed)
Patient called in stating he had a CT scan done on Thursday, since his scan he started itching on Friday and broke out in a rash. He stated he started taking Benadryl on Saturday, it helped but as soon as it wears off the itching starts again. He is still itching today, spoke to nurse and Dr. Burr Medico they stated that he needs to add Pepcid to the Benadryl and Dr. Burr Medico has called in a steroid for him to pick up at the pharmacy. Patient voiced understanding and was very appreciative of getting the help.

## 2022-12-08 ENCOUNTER — Encounter: Payer: Self-pay | Admitting: Hematology

## 2022-12-08 ENCOUNTER — Inpatient Hospital Stay (HOSPITAL_BASED_OUTPATIENT_CLINIC_OR_DEPARTMENT_OTHER): Payer: Medicare PPO | Admitting: Hematology

## 2022-12-08 ENCOUNTER — Other Ambulatory Visit: Payer: Self-pay

## 2022-12-08 ENCOUNTER — Ambulatory Visit: Payer: Medicare PPO | Admitting: Hematology and Oncology

## 2022-12-08 VITALS — BP 152/81 | HR 63 | Temp 98.1°F | Resp 18 | Ht 69.5 in | Wt 231.1 lb

## 2022-12-08 DIAGNOSIS — D500 Iron deficiency anemia secondary to blood loss (chronic): Secondary | ICD-10-CM

## 2022-12-08 DIAGNOSIS — C182 Malignant neoplasm of ascending colon: Secondary | ICD-10-CM | POA: Diagnosis not present

## 2022-12-08 NOTE — Progress Notes (Signed)
[place]   [phone] [phone]   Clinic Follow up Note   Patient Care Team: Arthur Holms, NP as PCP - General (Nurse Practitioner) Truitt Merle, MD as Consulting Physician (Oncology)  Date of Service:  12/08/2022  CHIEF COMPLAINT: f/u of colorectal cancer   CURRENT THERAPY:  Surveillance  ASSESSMENT:  George Watkins is a 72 y.o. male with   Cancer of right colon ([place]) pT3N2aMx, at least stage IIIB, MSS -Colonoscopy performed on 10/13/21 for IDA and abdominal pain showed 4 cm mass near hepatic flexure. Path confirmed adenocarcinoma. -Baseline CEA was elevated at 25.9 on 10/13/21 -PET scan on 11/09/21 showed: hypermetabolism to colon mass and adjacent mildly enlarged ileocolonic mesenteric lymph nodes. Otherwise, no definitive evidence of additional spread. -partial colectomy on 11/17/21 with Dr. Marcello Moores showed 5.6 cm invasive moderately differentiated adenocarcinoma invading into pericolic adipose. Margins negative, but metastatic carcinoma in 6 lymph nodes (6/15). Two additional tubular adenomas without high-grade dysplasia noted. MMR normal and MSI stable.  -given his multiple (6) positive lymph nodes, he completed 12 cycles adjuvant chemotherapy with FOLFOX 12/23/21 - 06/23/22. He tolerated well overall with mild side effects. 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 -Surveillance CT scan from December 01, 2022 showed no definitive evidence of recurrence, he has stable enlarged abdominal lymph nodes, with the largest one 1.6 cm, and stable prominent axillary lymphadenopathy.  Given his high risk of recurrence, I will order a PET scan for further evaluation.    PLAN: - -Recommend Taking Benadryl for his contrast allergy  -Discuss CT Scan results, will order PET scan to be done in 2wks for further evaluation of adenopathy  -Lab reviewed - flush in 6 weeks f/u in 3 months  SUMMARY OF ONCOLOGIC HISTORY: Oncology History Overview Note    Cancer Staging  Colorectal cancer [facility]) Staging form: Colon and Rectum, AJCC 8th Edition - Pathologic stage from 11/17/2021: Stage IIIB (pT3, pN2a, cM0) - Signed by Truitt Merle, MD on 12/03/2021    Cancer of right colon [facility])  10/13/2021 Procedure   Upper GI endoscopy by Dr. Ardis Hughs for IDA and abdominal pain  Findings:  - The esophagus was normal. - The stomach was normal. - The examined duodenum was normal.   10/13/2021 Procedure   Colonoscopy Indications: Iron deficiency  - A fungating and ulcerated partially obstructing, clearly malignant mass was found at the hepatic flexure. The mass was circumferential and measured 4cm in length. Biopsies were taken with a cold forceps for histology and then the mucosa just distal to the mass was tattooed with an injection of Spot (carbon black). jar 1 Findings: - Two pedunculated polyps were found in the mid transverse colon. The polyps were 8 to 11 mm in size. These polyps were removed with a hot snare. Resection and retrieval were complete. jar 2 - Five pedunculated and sessile polyps were found in the distal transverse colon. The polyps were 4 to 14 mm in size. Two were removed with hot snare, three were removed with cold snare. Resection and retrieval were complete. jar 3 - A 20 mm polyp was found in the distal transverse colon. The polyp was pedunculated. The polyp was removed with a hot snare. Resection and retrieval were complete. jar 4 - The exam was otherwise without abnormality on direct and retroflexion views.   10/13/2021 Pathology Results   REPORT OF SURGICAL PATHOLOGY Accession: [id number]  Diagnosis 1. Hepatic Flexure Biopsy - ADENOCARCINOMA, SEE NOTE   10/21/2021 Imaging  CT CHEST ABDOMEN PELVIS W CONTRAST  IMPRESSION: 1. There is a circumferential mass of the mid ascending colon measuring approximately 5 cm in length, in keeping with newly diagnosed primary colon malignancy. 2. Adjacent to the colon mass, there is  mesenteric fat stranding suggesting invasion beyond the muscularis as well as abnormally enlarged mesocolonic lymph nodes. 3. Mildly prominent, asymmetric right external iliac lymph nodes, nonspecific although suspicious for early nodal metastatic disease. 4. Asymmetrically prominent left axillary lymph nodes, nonspecific, these nodes less favored to reflect metastatic disease given location in the absence of other evidence of metastatic disease above the diaphragm. Both external iliac and axillary lymph nodes could be characterized for abnormal metabolic activity by PET-CT. 5. No evidence of organ metastatic disease in the chest, abdomen, or pelvis. 6. Exophytic soft tissue attenuation lesion of the anterior midportion of the left kidney measuring 1.2 cm. Partially exophytic soft tissue attenuation lesion of the posteroinferior pole of the left kidney measuring 1.9 x 1.7 cm. These may reflect hemorrhagic or proteinaceous cysts but are incompletely characterized and incidental renal cell carcinoma is not excluded. Recommend initial renal ultrasound to establish definitively cystic character, multiphasic contrast enhanced CT or MRI may however be further required to exclude malignancy. 7. Prostatomegaly with thickening of the decompressed urinary bladder wall, likely due to chronic outlet obstruction.   Aortic Atherosclerosis (ICD10-I70.0).   10/28/2021 Initial Diagnosis   Colorectal cancer ([place])   11/17/2021 Cancer Staging   Staging form: Colon and Rectum, AJCC 8th Edition - Pathologic stage from 11/17/2021: Stage IIIB (pT3, pN2a, cM0) - Signed by Truitt Merle, MD on 12/03/2021 Histologic grading system: 4 grade system Histologic grade (G): G2 Residual tumor (R): R0 - None   11/17/2021 Definitive Surgery   FINAL MICROSCOPIC DIAGNOSIS:   A. COLON, RIGHT, RESECTION:  Invasive moderately differentiated adenocarcinoma invading into  pericolic adipose (pT3)  Tumor measures 5.2 x 3.6 x 0.8  cm  Margins free of carcinoma and adenomatous change  Six of fifteen pericolic lymph nodes with metastatic carcinoma (6/15, pN2a)  Two additional tubular adenomas without high-grade dysplasia, largest measuring 1.3 cm  Benign appendix   ADDENDUM:  Mismatch Repair Protein (IHC)  SUMMARY INTERPRETATION: NORMAL  Microsatellite Instability (MSI) Result: STABLE   12/23/2021 -  Chemotherapy   Patient is on Treatment Plan : COLORECTAL FOLFOX q14d x 6 months        INTERVAL HISTORY:  George Watkins is here for a follow up of colorectal cancer  He was last seen by me on 08/18/2022 He presents to the clinic alone. Pt stated he has been itching all over since he has the CT scan . Pt reported that he has been putting cream on. Pt can't recall if it was an allergic reaction from the contrast. Pt completed the steroids.Pt states that his stomach hurts sometimes. Pt reports his stomach pain relates to what he maybe eating. Pt reports of having some pain with his port. Pt reports that he still have some neuropathy.     All other systems were reviewed with the patient and are negative.  MEDICAL HISTORY:  Past Medical History:  Diagnosis Date   Anemia    Arthritis    Colon cancer ([place])    Coronary artery disease    GERD (gastroesophageal reflux disease)    Heart murmur    Hiatal hernia    Hypertension    Pneumonia    Sleep apnea     SURGICAL HISTORY: Past Surgical History:  Procedure Laterality Date  ANKLE SURGERY     CIRCUMCISION     COLONOSCOPY     HERNIA REPAIR     PORTACATH PLACEMENT N/A 12/09/2021   Procedure: INSERTION PORT-A-CATH with ultrasound;  Surgeon: Leighton Ruff, MD;  Location: [facility];  Service: General;  Laterality: N/A;    I have reviewed the social history and family history with the patient and they are unchanged from previous note.  ALLERGIES:  is allergic to iodinated contrast media and sulfa antibiotics.  MEDICATIONS:  Current Outpatient Medications   Medication Sig Dispense Refill   amLODipine (NORVASC) 5 MG tablet Take 5 mg by mouth daily.     atorvastatin (LIPITOR) 10 MG tablet Take 10 mg by mouth at bedtime.     cholecalciferol (VITAMIN D3) 25 MCG (1000 UNIT) tablet Take 1,000 Units by mouth daily. (Patient not taking: Reported on 03/23/2022)     cloNIDine (CATAPRES) 0.2 MG tablet Take 0.2 mg by mouth 2 (two) times daily.     cyclobenzaprine (FLEXERIL) 5 MG tablet Take 5 mg by mouth at bedtime as needed for muscle spasms. (Patient not taking: Reported on 03/23/2022)     ferrous sulfate 325 (65 FE) MG tablet Take 1 tablet (325 mg total) by mouth 2 (two) times daily with a meal. 30 tablet 0   hydrochlorothiazide (HYDRODIURIL) 12.5 MG tablet Take 1 tablet by mouth daily.     KLOR-CON M10 10 MEQ tablet TAKE 1 TABLET BY MOUTH TWICE A DAY 180 tablet 1   labetalol (NORMODYNE) 200 MG tablet Take 200 mg by mouth 2 (two) times daily.     lidocaine-prilocaine (EMLA) cream Apply to affected area once 30 g 3   losartan (COZAAR) 100 MG tablet Take 0.5 tablets by mouth daily.     losartan-hydrochlorothiazide (HYZAAR) 100-25 MG tablet Take 1 tablet by mouth daily.     Multiple Vitamins-Minerals (MULTIVITAMIN MEN 50+ PO) Take 1 tablet by mouth daily.     predniSONE (DELTASONE) 5 MG tablet Take 4 tablets (20 mg total) by mouth daily with breakfast for 3 days. 12 tablet 0   No current facility-administered medications for this visit.    PHYSICAL EXAMINATION: ECOG PERFORMANCE STATUS: 1 - Symptomatic but completely ambulatory  Vitals:   12/08/22 1140  BP: (!) 152/81  Pulse: 63  Resp: 18  Temp: 98.1 F (36.7 C)  SpO2: 100%   Wt Readings from Last 3 Encounters:  12/08/22 231 lb 1.6 oz (104.8 kg)  08/18/22 222 lb 4.8 oz (100.8 kg)  07/31/22 225 lb (102.1 kg)     GENERAL:alert, no distress and comfortable SKIN: skin color normal, no rashes or significant lesions EYES: normal, Conjunctiva are pink and non-injected, sclera clear  NEURO: alert &  oriented x 3 with fluent speech  LABORATORY DATA:  I have reviewed the data as listed    Latest Ref Rng & Units 12/01/2022   10:36 AM 09/29/2022   11:14 AM 08/18/2022   10:56 AM  CBC  WBC 4.0 - 10.5 K/uL 5.7  5.7  7.3   Hemoglobin 13.0 - 17.0 g/dL 12.1  12.4  10.9   Hematocrit 39.0 - 52.0 % 37.2  39.9  34.1   Platelets 150 - 400 K/uL 201  199  250         Latest Ref Rng & Units 12/01/2022   10:36 AM 09/29/2022   11:14 AM 08/18/2022   10:56 AM  CMP  Glucose 70 - 99 mg/dL 90  125  90   BUN 8 -  23 mg/dL 12  11  14   $ Creatinine 0.61 - 1.24 mg/dL 1.11  1.12  1.22   Sodium 135 - 145 mmol/L 137  140  138   Potassium 3.5 - 5.1 mmol/L 3.5  3.4  3.6   Chloride 98 - 111 mmol/L 103  105  102   CO2 22 - 32 mmol/L 31  30  32   Calcium 8.9 - 10.3 mg/dL 9.1  9.6  9.5   Total Protein 6.5 - 8.1 g/dL 6.9  8.0  8.9   Total Bilirubin 0.3 - 1.2 mg/dL 0.7  0.8  0.8   Alkaline Phos 38 - 126 U/L 86  74  76   AST 15 - 41 U/L 23  19  19   $ ALT 0 - 44 U/L 23  16  12       $ RADIOGRAPHIC STUDIES: I have personally reviewed the radiological images as listed and agreed with the findings in the report. No results found.    Orders Placed This Encounter  Procedures   NM PET Image Restag (PS) Skull Base To Thigh    Evaluate abdominal adenopathy seen on CT scans    Standing Status:   Future    Standing Expiration Date:   12/08/2023    Order Specific Question:   If indicated for the ordered procedure, I authorize the administration of a radiopharmaceutical per Radiology protocol    Answer:   Yes    Order Specific Question:   Preferred imaging location?    Answer:   [facility]   Fer[facility]    Standing Status:   Standing    Number of Occurrences:   20    Standing Expiration Date:   12/09/2023   All questions were answered. The patient knows to call the clinic with any problems, questions or concerns. No barriers to learning was detected. The total time spent in the appointment was 30 minutes.     Truitt Merle, MD 12/08/2022   Felicity Coyer, CMA, am acting as scribe for Truitt Merle, MD.   I have reviewed the above documentation for accuracy and completeness, and I agree with the above.

## 2022-12-08 NOTE — Assessment & Plan Note (Signed)
pT3N2aMx, at least stage IIIB, MSS -Colonoscopy performed on 10/13/21 for IDA and abdominal pain showed 4 cm mass near hepatic flexure. Path confirmed adenocarcinoma. -Baseline CEA was elevated at 25.9 on 10/13/21 -PET scan on 11/09/21 showed: hypermetabolism to colon mass and adjacent mildly enlarged ileocolonic mesenteric lymph nodes. Otherwise, no definitive evidence of additional spread. -partial colectomy on 11/17/21 with Dr. Marcello Moores showed 5.6 cm invasive moderately differentiated adenocarcinoma invading into pericolic adipose. Margins negative, but metastatic carcinoma in 6 lymph nodes (6/15). Two additional tubular adenomas without high-grade dysplasia noted. MMR normal and MSI stable.  -given his multiple (6) positive lymph nodes, he completed 12 cycles adjuvant chemotherapy with FOLFOX 12/23/21 - 06/23/22. He tolerated well overall with mild side effects. 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 -Surveillance CT scan from December 01, 2022 showed no definitive evidence of recurrence, he has stable enlarged abdominal lymph nodes, with the largest one 1.6 cm, and stable prominent axillary lymphadenopathy.  Given his high risk of recurrence, I will order a PET scan for further evaluation.

## 2022-12-09 ENCOUNTER — Other Ambulatory Visit: Payer: Self-pay

## 2022-12-15 ENCOUNTER — Encounter: Payer: Self-pay | Admitting: Hematology

## 2022-12-22 ENCOUNTER — Encounter: Payer: Self-pay | Admitting: Gastroenterology

## 2022-12-22 ENCOUNTER — Encounter (HOSPITAL_COMMUNITY)
Admission: RE | Admit: 2022-12-22 | Discharge: 2022-12-22 | Disposition: A | Discharge status: Home / Self Care | Payer: Medicare PPO | Source: Ambulatory Visit | Attending: Hematology | Admitting: Hematology

## 2022-12-22 DIAGNOSIS — C182 Malignant neoplasm of ascending colon: Secondary | ICD-10-CM | POA: Diagnosis present

## 2022-12-22 LAB — GLUCOSE, CAPILLARY: Glucose-Capillary: 126 mg/dL — ABNORMAL HIGH (ref 70–99)

## 2022-12-22 MED ORDER — FLUDEOXYGLUCOSE F - 18 (FDG) INJECTION
11.5000 | Freq: Once | INTRAVENOUS | Status: AC
  Administered 2022-12-22: 11.5 via INTRAVENOUS

## 2022-12-23 ENCOUNTER — Other Ambulatory Visit: Payer: Self-pay

## 2022-12-27 ENCOUNTER — Encounter: Payer: Medicare PPO | Admitting: Podiatry

## 2022-12-27 ENCOUNTER — Ambulatory Visit: Payer: Medicare PPO | Admitting: Podiatry

## 2022-12-27 DIAGNOSIS — M79674 Pain in right toe(s): Secondary | ICD-10-CM

## 2022-12-27 DIAGNOSIS — B351 Tinea unguium: Secondary | ICD-10-CM

## 2022-12-27 DIAGNOSIS — M79675 Pain in left toe(s): Secondary | ICD-10-CM

## 2022-12-27 DIAGNOSIS — G620 Drug-induced polyneuropathy: Secondary | ICD-10-CM | POA: Diagnosis not present

## 2022-12-27 DIAGNOSIS — L84 Corns and callosities: Secondary | ICD-10-CM | POA: Diagnosis not present

## 2022-12-27 DIAGNOSIS — T451X5A Adverse effect of antineoplastic and immunosuppressive drugs, initial encounter: Secondary | ICD-10-CM

## 2022-12-27 NOTE — Patient Instructions (Signed)
Look for urea [id number] with salicylic acid 2% cream or ointment and apply to the thickened dry skin / calluses. This can be bought over the counter, at a pharmacy or online such as [employer]. Use it twice a day and a pumice stone to reduce the thickness.  Use aperture/callus pads (also found on [employer]) to put around the corn to keep pressure off of it

## 2022-12-28 ENCOUNTER — Ambulatory Visit: Payer: Medicare PPO | Admitting: Podiatry

## 2022-12-28 ENCOUNTER — Encounter: Payer: Self-pay | Admitting: Podiatry

## 2022-12-28 NOTE — Progress Notes (Signed)
Erroneous encounter - please disregard.

## 2022-12-28 NOTE — Progress Notes (Signed)
  Subjective:  Patient ID: George Watkins, male    DOB: 1951-01-04,  MRN: [id number]  Chief Complaint  Patient presents with   Nail Problem    Nail fungus, callus removal-per Nikaela Coyne   Flat Foot    72 y.o. male presents with the above complaint. History confirmed with patient.  Since his last visit with me he has developed significant numbness burning and tingling sensations, he recently completed chemotherapy.  His nails are thickened elongated brown-yellow discolored, he also has a large painful callus on the left foot  Objective:  Physical Exam: warm, good capillary refill, no trophic changes or ulcerative lesions, normal DP and PT pulses, and abnormal sensory exam with loss of protective sensation to midfoot, subjective paresthesias. Left Foot: dystrophic yellowed discolored nail plates with subungual debris and large porokeratosis submetatarsal 2 Right Foot: dystrophic yellowed discolored nail plates with subungual debris   Assessment:   1. Chemotherapy-induced peripheral neuropathy (HCC)   2. Callus of foot   3. Pain due to onychomycosis of toenails of both feet      Plan:  Patient was evaluated and treated and all questions answered.  Regarding his chemo neuropathy we discussed etiology and treatment options of this.  I discussed with him this may improve with time.  Most discussed the use of medication such as gabapentin if it does not improve or worsens.  He is having his Port-A-Cath removed soon, hopefully the further he gets from treatment this will resolve uneventfully.  Discussed the etiology and treatment options for the condition in detail with the patient. Educated patient on the topical and oral treatment options for mycotic nails. Recommended debridement of the nails today. Sharp and mechanical debridement performed of all painful and mycotic nails today. Nails debrided in length and thickness using a nail nipper to level of comfort. Discussed treatment options  including appropriate shoe gear. Follow up as needed for painful nails.   All symptomatic hyperkeratoses were safely debrided with a sterile #15 blade to patient's level of comfort without incident. We discussed preventative and palliative care of these lesions including supportive and accommodative shoegear, padding, prefabricated and custom molded accommodative orthoses, use of a pumice stone and lotions/creams daily.  Due to his significant neuropathy professional debridement is medically necessary.  I recommend he use urea cream with salicylic acid and a pressure pads which she will get on [place]   Return in about 3 months (around 03/29/2023) for painful thick fungal nails.

## 2023-01-19 ENCOUNTER — Other Ambulatory Visit: Payer: Self-pay

## 2023-01-19 ENCOUNTER — Inpatient Hospital Stay: Payer: Medicare PPO | Attending: Physician Assistant

## 2023-01-19 DIAGNOSIS — C182 Malignant neoplasm of ascending colon: Secondary | ICD-10-CM | POA: Diagnosis not present

## 2023-01-19 DIAGNOSIS — Z95828 Presence of other vascular implants and grafts: Secondary | ICD-10-CM

## 2023-01-19 MED ORDER — HEPARIN SOD (PORK) LOCK FLUSH 100 UNIT/ML IV SOLN
500.0000 [IU] | Freq: Once | INTRAVENOUS | Status: AC
  Administered 2023-01-19: 500 [IU]

## 2023-01-19 MED ORDER — SODIUM CHLORIDE 0.9% FLUSH
10.0000 mL | Freq: Once | INTRAVENOUS | Status: AC
  Administered 2023-01-19: 10 mL

## 2023-02-02 DIAGNOSIS — H527 Unspecified disorder of refraction: Secondary | ICD-10-CM | POA: Insufficient documentation

## 2023-02-02 DIAGNOSIS — I10 Essential (primary) hypertension: Secondary | ICD-10-CM | POA: Insufficient documentation

## 2023-02-03 ENCOUNTER — Encounter: Payer: Self-pay | Admitting: Hematology

## 2023-02-10 ENCOUNTER — Ambulatory Visit (AMBULATORY_SURGERY_CENTER): Payer: Medicare HMO

## 2023-02-10 VITALS — Ht 69.0 in | Wt 236.0 lb

## 2023-02-10 DIAGNOSIS — Z86010 Personal history of colonic polyps: Secondary | ICD-10-CM

## 2023-02-10 DIAGNOSIS — C189 Malignant neoplasm of colon, unspecified: Secondary | ICD-10-CM

## 2023-02-10 DIAGNOSIS — Z85038 Personal history of other malignant neoplasm of large intestine: Secondary | ICD-10-CM

## 2023-02-10 MED ORDER — NA SULFATE-K SULFATE-MG SULF 17.5-3.13-1.6 GM/177ML PO SOLN: 1.0000 | Freq: Once | ORAL | 0 refills | Status: AC

## 2023-02-10 MED ORDER — NA SULFATE-K SULFATE-MG SULF 17.5-3.13-1.6 GM/177ML PO SOLN: 1.0000 | Freq: Once | ORAL | 0 refills | Status: DC

## 2023-02-10 NOTE — Addendum Note (Signed)
Addended by: Jaquelyn Bitter on: 02/10/2023 09:24 AM   Modules accepted: Orders

## 2023-02-10 NOTE — Progress Notes (Signed)
No egg or soy allergy known to patient  No issues known to pt with past sedation with any surgeries or procedures Patient denies ever being told they had issues or difficulty with intubation  No FH of Malignant Hyperthermia Pt is not on diet pills Pt is not on  home 02  Pt is not on blood thinners  Pt denies issues with constipation  No A fib or A flutter Have any cardiac testing pending--no Pt instructed to use Single[employer]com or GoodRx for a price reduction on prep  Can ambulate without assistance Patient's chart reviewed by Cathlyn Parsons CNRA prior to previsit and patient appropriate for the [facility].  Previsit completed and red dot placed by patient's name on their procedure day (on provider's schedule).

## 2023-02-14 ENCOUNTER — Encounter: Payer: Self-pay | Admitting: Gastroenterology

## 2023-02-24 ENCOUNTER — Encounter: Payer: Self-pay | Admitting: Gastroenterology

## 2023-02-24 ENCOUNTER — Other Ambulatory Visit: Payer: Self-pay | Admitting: Gastroenterology

## 2023-02-24 ENCOUNTER — Encounter: Payer: Self-pay | Admitting: Hematology

## 2023-02-24 ENCOUNTER — Ambulatory Visit: Payer: Medicare HMO | Admitting: Gastroenterology

## 2023-02-24 VITALS — BP 103/64 | HR 53 | Temp 97.1°F | Resp 11 | Ht 69.5 in | Wt 216.0 lb

## 2023-02-24 DIAGNOSIS — D125 Benign neoplasm of sigmoid colon: Secondary | ICD-10-CM

## 2023-02-24 DIAGNOSIS — Z08 Encounter for follow-up examination after completed treatment for malignant neoplasm: Secondary | ICD-10-CM

## 2023-02-24 DIAGNOSIS — D124 Benign neoplasm of descending colon: Secondary | ICD-10-CM

## 2023-02-24 DIAGNOSIS — D123 Benign neoplasm of transverse colon: Secondary | ICD-10-CM | POA: Diagnosis not present

## 2023-02-24 DIAGNOSIS — Z85038 Personal history of other malignant neoplasm of large intestine: Secondary | ICD-10-CM

## 2023-02-24 DIAGNOSIS — K635 Polyp of colon: Secondary | ICD-10-CM | POA: Diagnosis not present

## 2023-02-24 MED ORDER — SODIUM CHLORIDE 0.9 % IV SOLN: 500.0000 mL | Freq: Once | INTRAVENOUS | Status: DC

## 2023-02-24 NOTE — Op Note (Signed)
[facility] Patient Name: George Watkins Procedure Date: 02/24/2023 2:17 PM MRN: [id number] Endoscopist: Corliss Parish , MD, [id number] Age: 72 Referring MD:  Date of Birth: 12/04/1950 Gender: Male Account #: [id number] Procedure:                Colonoscopy Indications:              High risk colon cancer surveillance: Personal                            history of colon cancer Medicines:                Monitored Anesthesia Care Procedure:                Pre-Anesthesia Assessment:                           - Prior to the procedure, a History and Physical                            was performed, and patient medications and                            allergies were reviewed. The patient's tolerance of                            previous anesthesia was also reviewed. The risks                            and benefits of the procedure and the sedation                            options and risks were discussed with the patient.                            All questions were answered, and informed consent                            was obtained. Prior Anticoagulants: The patient has                            taken no anticoagulant or antiplatelet agents. ASA                            Grade Assessment: III - A patient with severe                            systemic disease. After reviewing the risks and                            benefits, the patient was deemed in satisfactory                            condition to undergo the procedure.  After obtaining informed consent, the colonoscope                            was passed under direct vision. Throughout the                            procedure, the patient's blood pressure, pulse, and                            oxygen saturations were monitored continuously. The                            CF [id number] #[id number] was introduced through the anus                            and advanced to the the  ileocolonic anastomosis.                            The colonoscopy was performed without difficulty.                            The patient tolerated the procedure. The quality of                            the bowel preparation was good. Scope In: 2:25:40 PM Scope Out: 2:40:26 PM Scope Withdrawal Time: 0 hours 12 minutes 27 seconds  Total Procedure Duration: 0 hours 14 minutes 46 seconds  Findings:                 The digital rectal exam findings include                            hemorrhoids. Pertinent negatives include no                            palpable rectal lesions.                           There was evidence of a prior functional end-to-end                            ileo-colonic anastomosis in the transverse colon.                            This was patent and was characterized by healthy                            appearing mucosa and an intact staple line. The                            anastomosis was traversed. Removal of 6 visible                            staples was accomplished with a regular forceps.  The neo-terminal ileum appeared normal.                           Three sessile polyps were found in the sigmoid                            colon, descending colon and transverse colon. The                            polyps were 2 to 4 mm in size. These polyps were                            removed with a cold snare. Resection and retrieval                            were complete.                           A few small-mouthed diverticula were found in the                            sigmoid colon and descending colon.                           Normal mucosa was found in the entire colon                            otherwise.                           Non-bleeding non-thrombosed internal hemorrhoids                            were found during retroflexion, during perianal                            exam and during digital  exam. Complications:            No immediate complications. Estimated Blood Loss:     Estimated blood loss was minimal. Impression:               - Hemorrhoids found on digital rectal exam.                           - Patent functional end-to-end ileo-colonic                            anastomosis, characterized by healthy appearing                            mucosa and some intact staples (removed).                           - The examined portion of the ileum was normal.                           -  Three 2 to 4 mm polyps in the sigmoid colon, in                            the descending colon and in the transverse colon,                            removed with a cold snare. Resected and retrieved.                           - Diverticulosis in the sigmoid colon and in the                            descending colon.                           - Normal mucosa in the entire examined colon                            otherwise.                           - Non-bleeding non-thrombosed internal hemorrhoids. Recommendation:           - The patient will be observed post-procedure,                            until all discharge criteria are met.                           - Discharge patient to home.                           - Patient has a contact number available for                            emergencies. The signs and symptoms of potential                            delayed complications were discussed with the                            patient. Return to normal activities tomorrow.                            Written discharge instructions were provided to the                            patient.                           - High fiber diet.                           - Use FiberCon 1-2 tablets PO daily.                           -  Continue present medications.                           - Await pathology results.                           - Repeat colonoscopy in 3 years for surveillance no                             matter the pathology as result of history of colon                            cancer.                           - The findings and recommendations were discussed                            with the patient.                           - The findings and recommendations were discussed                            with the patient's family. Corliss Parish, MD 02/24/2023 2:47:11 PM

## 2023-02-24 NOTE — Progress Notes (Signed)
Called to room to assist during endoscopic procedure.  Patient ID and intended procedure confirmed with present staff. Received instructions for my participation in the procedure from the performing physician.  

## 2023-02-24 NOTE — Progress Notes (Signed)
Report to PACU, RN, vss, BBS= Clear.  

## 2023-02-24 NOTE — Progress Notes (Signed)
Pt's states no medical or surgical changes since previsit or office visit. 

## 2023-02-24 NOTE — Patient Instructions (Signed)
High fiber diet.  Use FiberCon 1-2 tablets by mouth daily.  Continue present medications. Await pathology results. Repeat colonoscopy in 3 years .  Handouts on polyps, diverticulosis, and hemorrhoids provided.  YOU HAD AN ENDOSCOPIC PROCEDURE TODAY AT THE [facility]:   Refer to the procedure report that was given to you for any specific questions about what was found during the examination.  If the procedure report does not answer your questions, please call your gastroenterologist to clarify.  If you requested that your care partner not be given the details of your procedure findings, then the procedure report has been included in a sealed envelope for you to review at your convenience later.  YOU SHOULD EXPECT: Some feelings of bloating in the abdomen. Passage of more gas than usual.  Walking can help get rid of the air that was put into your GI tract during the procedure and reduce the bloating. If you had a lower endoscopy (such as a colonoscopy or flexible sigmoidoscopy) you may notice spotting of blood in your stool or on the toilet paper. If you underwent a bowel prep for your procedure, you may not have a normal bowel movement for a few days.  Please Note:  You might notice some irritation and congestion in your nose or some drainage.  This is from the oxygen used during your procedure.  There is no need for concern and it should clear up in a day or so.  SYMPTOMS TO REPORT IMMEDIATELY:  Following lower endoscopy (colonoscopy or flexible sigmoidoscopy):  Excessive amounts of blood in the stool  Significant tenderness or worsening of abdominal pains  Swelling of the abdomen that is new, acute  Fever of 100F or higher   For urgent or emergent issues, a gastroenterologist can be reached at any hour by calling [phone]. Do not use MyChart messaging for urgent concerns.    DIET:  We do recommend a small meal at first, but then you may proceed to your regular diet.   Drink plenty of fluids but you should avoid alcoholic beverages for 24 hours.  ACTIVITY:  You should plan to take it easy for the rest of today and you should NOT DRIVE or use heavy machinery until tomorrow (because of the sedation medicines used during the test).    FOLLOW UP: Our staff will call the number listed on your records the next business day following your procedure.  We will call around 7:15- 8:00 am to check on you and address any questions or concerns that you may have regarding the information given to you following your procedure. If we do not reach you, we will leave a message.     If any biopsies were taken you will be contacted by phone or by letter within the next 1-3 weeks.  Please call us at [phone] if you have not heard about the biopsies in 3 weeks.    SIGNATURES/CONFIDENTIALITY: You and/or your care partner have signed paperwork which will be entered into your electronic medical record.  These signatures attest to the fact that that the information above on your After Visit Summary has been reviewed and is understood.  Full responsibility of the confidentiality of this discharge information lies with you and/or your care-partner.

## 2023-02-27 ENCOUNTER — Telehealth: Payer: Self-pay

## 2023-02-27 NOTE — Telephone Encounter (Signed)
  Follow up Call-     02/24/2023    1:38 PM 10/13/2021    1:14 PM  Call back number  Post procedure Call Back phone  # [phone]  Permission to leave phone message Yes Yes     Patient questions:  Do you have a fever, pain , or abdominal swelling? No. Pain Score  0 *  Have you tolerated food without any problems? Yes.    Have you been able to return to your normal activities? Yes.    Do you have any questions about your discharge instructions: Diet   No. Medications  No. Follow up visit  No.  Do you have questions or concerns about your Care? No.  Actions: * If pain score is 4 or above: No action needed, pain <4.

## 2023-03-01 ENCOUNTER — Encounter: Payer: Self-pay | Admitting: Gastroenterology

## 2023-03-22 ENCOUNTER — Other Ambulatory Visit: Payer: Self-pay

## 2023-03-22 DIAGNOSIS — C182 Malignant neoplasm of ascending colon: Secondary | ICD-10-CM

## 2023-03-23 ENCOUNTER — Inpatient Hospital Stay: Payer: Medicare HMO | Attending: Physician Assistant

## 2023-03-23 ENCOUNTER — Other Ambulatory Visit: Payer: Self-pay

## 2023-03-23 ENCOUNTER — Inpatient Hospital Stay: Payer: Medicare HMO | Admitting: Hematology

## 2023-03-23 ENCOUNTER — Encounter: Payer: Self-pay | Admitting: Hematology

## 2023-03-23 VITALS — BP 154/89 | HR 54 | Temp 98.3°F | Resp 18 | Ht 69.5 in | Wt 218.4 lb

## 2023-03-23 DIAGNOSIS — D500 Iron deficiency anemia secondary to blood loss (chronic): Secondary | ICD-10-CM

## 2023-03-23 DIAGNOSIS — Z95828 Presence of other vascular implants and grafts: Secondary | ICD-10-CM

## 2023-03-23 DIAGNOSIS — C19 Malignant neoplasm of rectosigmoid junction: Secondary | ICD-10-CM

## 2023-03-23 DIAGNOSIS — G629 Polyneuropathy, unspecified: Secondary | ICD-10-CM | POA: Diagnosis not present

## 2023-03-23 DIAGNOSIS — C779 Secondary and unspecified malignant neoplasm of lymph node, unspecified: Secondary | ICD-10-CM | POA: Diagnosis not present

## 2023-03-23 DIAGNOSIS — C182 Malignant neoplasm of ascending colon: Secondary | ICD-10-CM | POA: Diagnosis present

## 2023-03-23 LAB — CBC WITH DIFFERENTIAL (CANCER CENTER ONLY)
Abs Immature Granulocytes: 0.02 10*3/uL (ref 0.00–0.07)
Basophils Absolute: 0 10*3/uL (ref 0.0–0.1)
Basophils Relative: 0 %
Eosinophils Absolute: 0.1 10*3/uL (ref 0.0–0.5)
Eosinophils Relative: 2 %
HCT: 36.8 % — ABNORMAL LOW (ref 39.0–52.0)
Hemoglobin: 11.6 g/dL — ABNORMAL LOW (ref 13.0–17.0)
Immature Granulocytes: 0 %
Lymphocytes Relative: 23 %
Lymphs Abs: 1.6 10*3/uL (ref 0.7–4.0)
MCH: 27.6 pg (ref 26.0–34.0)
MCHC: 31.5 g/dL (ref 30.0–36.0)
MCV: 87.6 fL (ref 80.0–100.0)
Monocytes Absolute: 0.7 10*3/uL (ref 0.1–1.0)
Monocytes Relative: 10 %
Neutro Abs: 4.5 10*3/uL (ref 1.7–7.7)
Neutrophils Relative %: 65 %
Platelet Count: 177 10*3/uL (ref 150–400)
RBC: 4.2 MIL/uL — ABNORMAL LOW (ref 4.22–5.81)
RDW: 13.1 % (ref 11.5–15.5)
WBC Count: 7 10*3/uL (ref 4.0–10.5)
nRBC: 0 % (ref 0.0–0.2)

## 2023-03-23 LAB — CMP (CANCER CENTER ONLY)
ALT: 17 U/L (ref 0–44)
AST: 18 U/L (ref 15–41)
Albumin: 3.6 g/dL (ref 3.5–5.0)
Alkaline Phosphatase: 68 U/L (ref 38–126)
Anion gap: 4 — ABNORMAL LOW (ref 5–15)
BUN: 12 mg/dL (ref 8–23)
CO2: 31 mmol/L (ref 22–32)
Calcium: 8.7 mg/dL — ABNORMAL LOW (ref 8.9–10.3)
Chloride: 104 mmol/L (ref 98–111)
Creatinine: 1.19 mg/dL (ref 0.61–1.24)
GFR, Estimated: >60 mL/min (ref >60)
Glucose, Bld: 179 mg/dL — ABNORMAL HIGH (ref 70–99)
Potassium: 3.3 mmol/L — ABNORMAL LOW (ref 3.5–5.1)
Sodium: 139 mmol/L (ref 135–145)
Total Bilirubin: 1 mg/dL (ref 0.3–1.2)
Total Protein: 7.5 g/dL (ref 6.5–8.1)

## 2023-03-23 LAB — FERRITIN: Ferritin: 234 ng/mL (ref 24–336)

## 2023-03-23 LAB — CEA (ACCESS): CEA (CHCC): <1 ng/mL (ref 0.00–5.00)

## 2023-03-23 MED ORDER — SODIUM CHLORIDE 0.9% FLUSH
10.0000 mL | Freq: Once | INTRAVENOUS | Status: AC
  Administered 2023-03-23: 10 mL

## 2023-03-23 MED ORDER — HEPARIN SOD (PORK) LOCK FLUSH 100 UNIT/ML IV SOLN
500.0000 [IU] | Freq: Once | INTRAVENOUS | Status: AC
  Administered 2023-03-23: 500 [IU]

## 2023-03-23 MED ORDER — LIDOCAINE-PRILOCAINE 2.5-2.5 % EX CREA
TOPICAL_CREAM | CUTANEOUS | 3 refills | Status: DC
Start: 2023-03-23 — End: 2023-11-16

## 2023-03-23 NOTE — Assessment & Plan Note (Signed)
pT3N2aMx, at least stage IIIB, MSS -Colonoscopy performed on 10/13/21 for IDA and abdominal pain showed 4 cm mass near hepatic flexure. Path confirmed adenocarcinoma. -Baseline CEA was elevated at 25.9 on 10/13/21 -PET scan on 11/09/21 showed: hypermetabolism to colon mass and adjacent mildly enlarged ileocolonic mesenteric lymph nodes. Otherwise, no definitive evidence of additional spread. -partial colectomy on 11/17/21 with Dr. Maisie Fus showed 5.6 cm invasive moderately differentiated adenocarcinoma invading into pericolic adipose. Margins negative, but metastatic carcinoma in 6 lymph nodes (6/15). Two additional tubular adenomas without high-grade dysplasia noted. MMR normal and MSI stable.  -given his multiple (6) positive lymph nodes, he completed 12 cycles adjuvant chemotherapy with FOLFOX 12/23/21 - 06/23/22. He tolerated well overall with mild side effects. 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 -Surveillance CT scan from December 01, 2022 showed no definitive evidence of recurrence, he has stable enlarged abdominal lymph nodes, with the largest one 1.6 cm, and stable prominent axillary lymphadenopathy. This is further evaluated by PET on 12/23/2022 which showed a stable mildly hypermetabolic adenopathy in the neck and left axilla, otherwise negative.

## 2023-03-23 NOTE — Progress Notes (Signed)
[facility]   Telephone:([phone] Fax:([phone]   Clinic Follow up Note   Patient Care Team: Loura Back, NP as PCP - General (Nurse Practitioner) Malachy Mood, MD as Consulting Physician (Oncology)  Date of Service:  03/23/2023  CHIEF COMPLAINT: f/u of colorectal cancer   CURRENT THERAPY:  Surveillance   ASSESSMENT:  George Watkins is a 72 y.o. male with   Cancer of right colon (HCC) pT3N2aMx, at least stage IIIB, MSS -Colonoscopy performed on 10/13/21 for IDA and abdominal pain showed 4 cm mass near hepatic flexure. Path confirmed adenocarcinoma. -Baseline CEA was elevated at 25.9 on 10/13/21 -PET scan on 11/09/21 showed: hypermetabolism to colon mass and adjacent mildly enlarged ileocolonic mesenteric lymph nodes. Otherwise, no definitive evidence of additional spread. -partial colectomy on 11/17/21 with Dr. Maisie Fus showed 5.6 cm invasive moderately differentiated adenocarcinoma invading into pericolic adipose. Margins negative, but metastatic carcinoma in 6 lymph nodes (6/15). Two additional tubular adenomas without high-grade dysplasia noted. MMR normal and MSI stable.  -given his multiple (6) positive lymph nodes, he completed 12 cycles adjuvant chemotherapy with FOLFOX 12/23/21 - 06/23/22. He tolerated well overall with mild side effects. 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 -Surveillance CT scan from December 01, 2022 showed no definitive evidence of recurrence, he has stable enlarged abdominal lymph nodes, with the largest one 1.6 cm, and stable prominent axillary lymphadenopathy. This is further evaluated by PET on 12/23/2022 which showed a stable mildly hypermetabolic adenopathy in the neck and left axilla, otherwise negative. -he is clinically doing well, lab reviewed, physical exam was unremarkable today, there is no clinical concern for recurrence -Follow-up in 3 months with next restaging CT scan     PLAN: -lab reviewed  - I order CT CAP  in 3 months -lab/flush in  8 weeks and lab/flush and f/u in 3 months  SUMMARY OF ONCOLOGIC HISTORY: Oncology History Overview Note   Cancer Staging  Colorectal cancer [facility]) Staging form: Colon and Rectum, AJCC 8th Edition - Pathologic stage from 11/17/2021: Stage IIIB (pT3, pN2a, cM0) - Signed by Malachy Mood, MD on 12/03/2021    Cancer of right colon [facility])  10/13/2021 Procedure   Upper GI endoscopy by Dr. Christella Hartigan for IDA and abdominal pain  Findings:  - The esophagus was normal. - The stomach was normal. - The examined duodenum was normal.   10/13/2021 Procedure   Colonoscopy Indications: Iron deficiency  - A fungating and ulcerated partially obstructing, clearly malignant mass was found at the hepatic flexure. The mass was circumferential and measured 4cm in length. Biopsies were taken with a cold forceps for histology and then the mucosa just distal to the mass was tattooed with an injection of Spot (carbon black). jar 1 Findings: - Two pedunculated polyps were found in the mid transverse colon. The polyps were 8 to 11 mm in size. These polyps were removed with a hot snare. Resection and retrieval were complete. jar 2 - Five pedunculated and sessile polyps were found in the distal transverse colon. The polyps were 4 to 14 mm in size. Two were removed with hot snare, three were removed with cold snare. Resection and retrieval were complete. jar 3 - A 20 mm polyp was found in the distal transverse colon. The polyp was pedunculated. The polyp was removed with a hot snare. Resection and retrieval were complete. jar 4 - The exam was otherwise without abnormality on direct and retroflexion views.   10/13/2021 Pathology Results   REPORT  OF SURGICAL PATHOLOGY Accession: [id number]  Diagnosis 1. Hepatic Flexure Biopsy - ADENOCARCINOMA, SEE NOTE   10/21/2021 Imaging   CT CHEST ABDOMEN PELVIS W CONTRAST  IMPRESSION: 1. There is a circumferential mass of the mid ascending  colon measuring approximately 5 cm in length, in keeping with newly diagnosed primary colon malignancy. 2. Adjacent to the colon mass, there is mesenteric fat stranding suggesting invasion beyond the muscularis as well as abnormally enlarged mesocolonic lymph nodes. 3. Mildly prominent, asymmetric right external iliac lymph nodes, nonspecific although suspicious for early nodal metastatic disease. 4. Asymmetrically prominent left axillary lymph nodes, nonspecific, these nodes less favored to reflect metastatic disease given location in the absence of other evidence of metastatic disease above the diaphragm. Both external iliac and axillary lymph nodes could be characterized for abnormal metabolic activity by PET-CT. 5. No evidence of organ metastatic disease in the chest, abdomen, or pelvis. 6. Exophytic soft tissue attenuation lesion of the anterior midportion of the left kidney measuring 1.2 cm. Partially exophytic soft tissue attenuation lesion of the posteroinferior pole of the left kidney measuring 1.9 x 1.7 cm. These may reflect hemorrhagic or proteinaceous cysts but are incompletely characterized and incidental renal cell carcinoma is not excluded. Recommend initial renal ultrasound to establish definitively cystic character, multiphasic contrast enhanced CT or MRI may however be further required to exclude malignancy. 7. Prostatomegaly with thickening of the decompressed urinary bladder wall, likely due to chronic outlet obstruction.   Aortic Atherosclerosis (ICD10-I70.0).   10/28/2021 Initial Diagnosis   Colorectal cancer (HCC)   11/17/2021 Cancer Staging   Staging form: Colon and Rectum, AJCC 8th Edition - Pathologic stage from 11/17/2021: Stage IIIB (pT3, pN2a, cM0) - Signed by Malachy Mood, MD on 12/03/2021 Histologic grading system: 4 grade system Histologic grade (G): G2 Residual tumor (R): R0 - None   11/17/2021 Definitive Surgery   FINAL MICROSCOPIC DIAGNOSIS:   A.  COLON, RIGHT, RESECTION:  Invasive moderately differentiated adenocarcinoma invading into  pericolic adipose (pT3)  Tumor measures 5.2 x 3.6 x 0.8 cm  Margins free of carcinoma and adenomatous change  Six of fifteen pericolic lymph nodes with metastatic carcinoma (6/15, pN2a)  Two additional tubular adenomas without high-grade dysplasia, largest measuring 1.3 cm  Benign appendix   ADDENDUM:  Mismatch Repair Protein (IHC)  SUMMARY INTERPRETATION: NORMAL  Microsatellite Instability (MSI) Result: STABLE   12/23/2021 -  Chemotherapy   Patient is on Treatment Plan : COLORECTAL FOLFOX q14d x 6 months     11/25/2022 Imaging    IMPRESSION: 1. No definitive findings to suggest metastatic disease in the chest, abdomen or pelvis. Prominent borderline enlarged lymph nodes in the portacaval and hepatoduodenal ligament, stable compared to the prior examination, nonspecific. Continued attention on follow-up studies is recommended. 2. Multiple prominent borderline enlarged left axillary lymph nodes, stable compared to prior studies, nonspecific but favored to be benign given their stability in size. 3. Colonic diverticulosis without evidence of acute diverticulitis at this time. 4. Aortic atherosclerosis. 5. Additional incidental findings, as above.      INTERVAL HISTORY:  George Watkins is here for a follow up of colorectal cancer . He was last seen by me on 12/08/2022. He presents to the clinic alone. Pt state that he is doing well. His appetite is good, he states that he doesn't east as much as he use to. Pt state that he is able to do his usually. Pt state that his neuropathy  is still and he is now taking a different  medication for it and not the Gabapentin.       All other systems were reviewed with the patient and are negative.  MEDICAL HISTORY:  Past Medical History:  Diagnosis Date   Anemia    Arthritis    Colon cancer (HCC)    Coronary artery disease    GERD  (gastroesophageal reflux disease)    Heart murmur    Hiatal hernia    Hypertension    Pneumonia    Sleep apnea     SURGICAL HISTORY: Past Surgical History:  Procedure Laterality Date   ANKLE SURGERY     CIRCUMCISION     COLONOSCOPY     HERNIA REPAIR     PORTACATH PLACEMENT N/A 12/09/2021   Procedure: INSERTION PORT-A-CATH with ultrasound;  Surgeon: Romie Levee, MD;  Location: [facility];  Service: General;  Laterality: N/A;    I have reviewed the social history and family history with the patient and they are unchanged from previous note.  ALLERGIES:  is allergic to iodinated contrast media and sulfa antibiotics.  MEDICATIONS:  Current Outpatient Medications  Medication Sig Dispense Refill   amLODipine (NORVASC) 5 MG tablet Take 5 mg by mouth daily.     atorvastatin (LIPITOR) 10 MG tablet Take 10 mg by mouth at bedtime.     cholecalciferol (VITAMIN D3) 25 MCG (1000 UNIT) tablet Take 1,000 Units by mouth daily.     cloNIDine (CATAPRES) 0.2 MG tablet Take 0.2 mg by mouth 2 (two) times daily.     cyclobenzaprine (FLEXERIL) 5 MG tablet Take 5 mg by mouth at bedtime as needed for muscle spasms. (Patient not taking: Reported on 03/23/2022)     ferrous sulfate 325 (65 FE) MG tablet Take 1 tablet (325 mg total) by mouth 2 (two) times daily with a meal. 30 tablet 0   hydrochlorothiazide (HYDRODIURIL) 12.5 MG tablet Take 1 tablet by mouth daily.     KLOR-CON M10 10 MEQ tablet TAKE 1 TABLET BY MOUTH TWICE A DAY 180 tablet 1   labetalol (NORMODYNE) 200 MG tablet Take 200 mg by mouth 2 (two) times daily.     lidocaine-prilocaine (EMLA) cream Apply to affected area once 30 g 3   losartan (COZAAR) 100 MG tablet Take 0.5 tablets by mouth daily.     losartan-hydrochlorothiazide (HYZAAR) 100-25 MG tablet Take 1 tablet by mouth daily. (Patient not taking: Reported on 02/24/2023)     Multiple Vitamins-Minerals (MULTIVITAMIN MEN 50+ PO) Take 1 tablet by mouth daily.     No current facility-administered  medications for this visit.    PHYSICAL EXAMINATION: ECOG PERFORMANCE STATUS: 0 - Asymptomatic  Vitals:   03/23/23 1132  BP: (!) 154/89  Pulse: (!) 54  Resp: 18  Temp: 98.3 F (36.8 C)  SpO2: 100%   Wt Readings from Last 3 Encounters:  03/23/23 218 lb 6.4 oz (99.1 kg)  02/24/23 216 lb (98 kg)  02/10/23 236 lb (107 kg)     GENERAL:alert, no distress and comfortable SKIN: skin color normal, no rashes or significant lesions EYES: normal, Conjunctiva are pink and non-injected, sclera clear  NEURO: alert & oriented x 3 with fluent speech NECK: (-)supple, thyroid normal size, non-tender, without nodularity LYMPH:  (-) no palpable lymphadenopathy in the cervical, axillary  ABDOMEN:(-)abdomen soft, (-) non-tender and (-)normal bowel sounds. Scar tissue in the left abdomina wall   LABORATORY DATA:  I have reviewed the data as listed    Latest Ref Rng & Units 03/23/2023   11:09 AM  12/01/2022   10:36 AM 09/29/2022   11:14 AM  CBC  WBC 4.0 - 10.5 K/uL 7.0  5.7  5.7   Hemoglobin 13.0 - 17.0 g/dL 44.0  34.7  42.5   Hematocrit 39.0 - 52.0 % 36.8  37.2  39.9   Platelets 150 - 400 K/uL 177  201  199         Latest Ref Rng & Units 03/23/2023   11:09 AM 12/01/2022   10:36 AM 09/29/2022   11:14 AM  CMP  Glucose 70 - 99 mg/dL 956  90  387   BUN 8 - 23 mg/dL 12  12  11    Creatinine 0.61 - 1.24 mg/dL 5.64  3.32  9.51   Sodium 135 - 145 mmol/L 139  137  140   Potassium 3.5 - 5.1 mmol/L 3.3  3.5  3.4   Chloride 98 - 111 mmol/L 104  103  105   CO2 22 - 32 mmol/L 31  31  30    Calcium 8.9 - 10.3 mg/dL 8.7  9.1  9.6   Total Protein 6.5 - 8.1 g/dL 7.5  6.9  8.0   Total Bilirubin 0.3 - 1.2 mg/dL 1.0  0.7  0.8   Alkaline Phos 38 - 126 U/L 68  86  74   AST 15 - 41 U/L 18  23  19    ALT 0 - 44 U/L 17  23  16        RADIOGRAPHIC STUDIES: I have personally reviewed the radiological images as listed and agreed with the findings in the report. No results found.    Orders Placed This  Encounter  Procedures   CT CHEST ABDOMEN PELVIS W CONTRAST    Standing Status:   Future    Standing Expiration Date:   03/22/2024    Order Specific Question:   If indicated for the ordered procedure, I authorize the administration of contrast media per Radiology protocol    Answer:   Yes    Order Specific Question:   Does the patient have a contrast media/X-ray dye allergy?    Answer:   Yes    Order Specific Question:   Preferred imaging location?    Answer:   [facility]    Order Specific Question:   Release to patient    Answer:   Immediate    Order Specific Question:   If indicated for the ordered procedure, I authorize the administration of oral contrast media per Radiology protocol    Answer:   Yes   All questions were answered. The patient knows to call the clinic with any problems, questions or concerns. No barriers to learning was detected. The total time spent in the appointment was 25 minutes.     Malachy Mood, MD 03/23/2023   Carolin Coy, CMA, am acting as scribe for Malachy Mood, MD.   I have reviewed the above documentation for accuracy and completeness, and I agree with the above.

## 2023-03-27 ENCOUNTER — Telehealth: Payer: Self-pay | Admitting: Hematology

## 2023-03-27 NOTE — Telephone Encounter (Signed)
Scheduled appointments per los. Patient is aware of all made appointments. 

## 2023-03-28 ENCOUNTER — Other Ambulatory Visit: Payer: Self-pay

## 2023-05-04 ENCOUNTER — Inpatient Hospital Stay: Payer: Medicare HMO | Attending: Physician Assistant

## 2023-06-07 ENCOUNTER — Other Ambulatory Visit: Payer: Self-pay

## 2023-06-07 MED ORDER — PREDNISONE 50 MG PO TABS: ORAL_TABLET | ORAL | 0 refills | Status: DC

## 2023-06-07 MED ORDER — DIPHENHYDRAMINE HCL 50 MG PO TABS: 50.0000 mg | ORAL_TABLET | Freq: Every evening | ORAL | 0 refills | Status: AC | PRN

## 2023-06-13 ENCOUNTER — Other Ambulatory Visit: Payer: Self-pay

## 2023-06-15 ENCOUNTER — Inpatient Hospital Stay: Payer: Medicare HMO | Attending: Physician Assistant

## 2023-06-15 ENCOUNTER — Ambulatory Visit (HOSPITAL_COMMUNITY)
Admission: RE | Admit: 2023-06-15 | Discharge: 2023-06-15 | Disposition: A | Discharge status: Home / Self Care | Payer: Medicare HMO | Source: Ambulatory Visit | Attending: Hematology | Admitting: Hematology

## 2023-06-15 DIAGNOSIS — C182 Malignant neoplasm of ascending colon: Secondary | ICD-10-CM | POA: Insufficient documentation

## 2023-06-15 DIAGNOSIS — M899 Disorder of bone, unspecified: Secondary | ICD-10-CM | POA: Insufficient documentation

## 2023-06-15 DIAGNOSIS — G8929 Other chronic pain: Secondary | ICD-10-CM | POA: Insufficient documentation

## 2023-06-15 DIAGNOSIS — M545 Low back pain, unspecified: Secondary | ICD-10-CM | POA: Insufficient documentation

## 2023-06-15 DIAGNOSIS — Z95828 Presence of other vascular implants and grafts: Secondary | ICD-10-CM

## 2023-06-15 DIAGNOSIS — G629 Polyneuropathy, unspecified: Secondary | ICD-10-CM | POA: Insufficient documentation

## 2023-06-15 DIAGNOSIS — D500 Iron deficiency anemia secondary to blood loss (chronic): Secondary | ICD-10-CM

## 2023-06-15 DIAGNOSIS — C779 Secondary and unspecified malignant neoplasm of lymph node, unspecified: Secondary | ICD-10-CM | POA: Insufficient documentation

## 2023-06-15 LAB — CMP (CANCER CENTER ONLY)
ALT: 17 U/L (ref 0–44)
AST: 17 U/L (ref 15–41)
Albumin: 3.9 g/dL (ref 3.5–5.0)
Alkaline Phosphatase: 85 U/L (ref 38–126)
Anion gap: 7 (ref 5–15)
BUN: 15 mg/dL (ref 8–23)
CO2: 29 mmol/L (ref 22–32)
Calcium: 9.4 mg/dL (ref 8.9–10.3)
Chloride: 102 mmol/L (ref 98–111)
Creatinine: 1.08 mg/dL (ref 0.61–1.24)
GFR, Estimated: >60 mL/min (ref >60)
Glucose, Bld: 167 mg/dL — ABNORMAL HIGH (ref 70–99)
Potassium: 4 mmol/L (ref 3.5–5.1)
Sodium: 138 mmol/L (ref 135–145)
Total Bilirubin: 0.7 mg/dL (ref 0.3–1.2)
Total Protein: 8.3 g/dL — ABNORMAL HIGH (ref 6.5–8.1)

## 2023-06-15 LAB — CBC WITH DIFFERENTIAL (CANCER CENTER ONLY)
Abs Immature Granulocytes: 0.04 10*3/uL (ref 0.00–0.07)
Basophils Absolute: 0 10*3/uL (ref 0.0–0.1)
Basophils Relative: 0 %
Eosinophils Absolute: 0 10*3/uL (ref 0.0–0.5)
Eosinophils Relative: 0 %
HCT: 40 % (ref 39.0–52.0)
Hemoglobin: 13.1 g/dL (ref 13.0–17.0)
Immature Granulocytes: 0 %
Lymphocytes Relative: 7 %
Lymphs Abs: 0.9 10*3/uL (ref 0.7–4.0)
MCH: 28.1 pg (ref 26.0–34.0)
MCHC: 32.8 g/dL (ref 30.0–36.0)
MCV: 85.7 fL (ref 80.0–100.0)
Monocytes Absolute: 0.3 10*3/uL (ref 0.1–1.0)
Monocytes Relative: 2 %
Neutro Abs: 10.7 10*3/uL — ABNORMAL HIGH (ref 1.7–7.7)
Neutrophils Relative %: 91 %
Platelet Count: 230 10*3/uL (ref 150–400)
RBC: 4.67 MIL/uL (ref 4.22–5.81)
RDW: 12.9 % (ref 11.5–15.5)
WBC Count: 11.9 10*3/uL — ABNORMAL HIGH (ref 4.0–10.5)
nRBC: 0 % (ref 0.0–0.2)

## 2023-06-15 LAB — CEA (ACCESS): CEA (CHCC): <1 ng/mL (ref 0.00–5.00)

## 2023-06-15 LAB — FERRITIN: Ferritin: 230 ng/mL (ref 24–336)

## 2023-06-15 MED ORDER — HEPARIN SOD (PORK) LOCK FLUSH 100 UNIT/ML IV SOLN
INTRAVENOUS | Status: AC
  Filled 2023-06-15: qty 5

## 2023-06-15 MED ORDER — IOHEXOL 300 MG/ML  SOLN
100.0000 mL | Freq: Once | INTRAMUSCULAR | Status: AC | PRN
  Administered 2023-06-15: 100 mL via INTRAVENOUS

## 2023-06-15 MED ORDER — HEPARIN SOD (PORK) LOCK FLUSH 100 UNIT/ML IV SOLN
500.0000 [IU] | Freq: Once | INTRAVENOUS | Status: AC
  Administered 2023-06-15: 500 [IU] via INTRAVENOUS

## 2023-06-15 MED ORDER — SODIUM CHLORIDE 0.9% FLUSH
10.0000 mL | Freq: Once | INTRAVENOUS | Status: AC
  Administered 2023-06-15: 10 mL

## 2023-06-22 ENCOUNTER — Encounter: Payer: Self-pay | Admitting: Hematology

## 2023-06-22 ENCOUNTER — Inpatient Hospital Stay: Payer: Medicare HMO

## 2023-06-22 ENCOUNTER — Inpatient Hospital Stay (HOSPITAL_BASED_OUTPATIENT_CLINIC_OR_DEPARTMENT_OTHER): Payer: Medicare HMO | Admitting: Hematology

## 2023-06-22 VITALS — BP 111/55 | HR 56 | Temp 98.0°F | Resp 18 | Ht 69.5 in | Wt 230.5 lb

## 2023-06-22 DIAGNOSIS — C182 Malignant neoplasm of ascending colon: Secondary | ICD-10-CM

## 2023-06-22 DIAGNOSIS — M899 Disorder of bone, unspecified: Secondary | ICD-10-CM

## 2023-06-22 DIAGNOSIS — C779 Secondary and unspecified malignant neoplasm of lymph node, unspecified: Secondary | ICD-10-CM | POA: Diagnosis not present

## 2023-06-22 DIAGNOSIS — G629 Polyneuropathy, unspecified: Secondary | ICD-10-CM | POA: Diagnosis not present

## 2023-06-22 DIAGNOSIS — G8929 Other chronic pain: Secondary | ICD-10-CM | POA: Diagnosis not present

## 2023-06-22 DIAGNOSIS — M545 Low back pain, unspecified: Secondary | ICD-10-CM | POA: Diagnosis not present

## 2023-06-22 LAB — CMP (CANCER CENTER ONLY)
ALT: 15 U/L (ref 0–44)
AST: 16 U/L (ref 15–41)
Albumin: 3.9 g/dL (ref 3.5–5.0)
Alkaline Phosphatase: 82 U/L (ref 38–126)
Anion gap: 4 — ABNORMAL LOW (ref 5–15)
BUN: 11 mg/dL (ref 8–23)
CO2: 34 mmol/L — ABNORMAL HIGH (ref 22–32)
Calcium: 9.6 mg/dL (ref 8.9–10.3)
Chloride: 102 mmol/L (ref 98–111)
Creatinine: 1.32 mg/dL — ABNORMAL HIGH (ref 0.61–1.24)
GFR, Estimated: 58 mL/min — ABNORMAL LOW (ref >60)
Glucose, Bld: 112 mg/dL — ABNORMAL HIGH (ref 70–99)
Potassium: 3.2 mmol/L — ABNORMAL LOW (ref 3.5–5.1)
Sodium: 140 mmol/L (ref 135–145)
Total Bilirubin: 0.9 mg/dL (ref 0.3–1.2)
Total Protein: 7.7 g/dL (ref 6.5–8.1)

## 2023-06-22 LAB — CBC WITH DIFFERENTIAL (CANCER CENTER ONLY)
Abs Immature Granulocytes: 0.03 10*3/uL (ref 0.00–0.07)
Basophils Absolute: 0 10*3/uL (ref 0.0–0.1)
Basophils Relative: 0 %
Eosinophils Absolute: 0.2 10*3/uL (ref 0.0–0.5)
Eosinophils Relative: 2 %
HCT: 41.7 % (ref 39.0–52.0)
Hemoglobin: 13 g/dL (ref 13.0–17.0)
Immature Granulocytes: 0 %
Lymphocytes Relative: 15 %
Lymphs Abs: 1.5 10*3/uL (ref 0.7–4.0)
MCH: 27.4 pg (ref 26.0–34.0)
MCHC: 31.2 g/dL (ref 30.0–36.0)
MCV: 88 fL (ref 80.0–100.0)
Monocytes Absolute: 1.1 10*3/uL — ABNORMAL HIGH (ref 0.1–1.0)
Monocytes Relative: 11 %
Neutro Abs: 7.1 10*3/uL (ref 1.7–7.7)
Neutrophils Relative %: 72 %
Platelet Count: 222 10*3/uL (ref 150–400)
RBC: 4.74 MIL/uL (ref 4.22–5.81)
RDW: 13.1 % (ref 11.5–15.5)
WBC Count: 9.9 10*3/uL (ref 4.0–10.5)
nRBC: 0 % (ref 0.0–0.2)

## 2023-06-22 NOTE — Progress Notes (Signed)
[facility]   [phone] [phone]   Clinic Follow up Note   Patient Care Team: Loura Back, NP as PCP - General (Nurse Practitioner) Malachy Mood, MD as Consulting Physician (Oncology)  Date of Service:  06/22/2023  CHIEF COMPLAINT: f/u of colorectal cancer   CURRENT THERAPY:  Surveillance  ASSESSMENT:  George Watkins is a 72 y.o. male with   Cancer of right colon (HCC) pT3N2aMx, at least stage IIIB, MSS -Colonoscopy performed on 10/13/21 for IDA and abdominal pain showed 4 cm mass near hepatic flexure. Path confirmed adenocarcinoma. -Baseline CEA was elevated at 25.9 on 10/13/21 -PET scan on 11/09/21 showed: hypermetabolism to colon mass and adjacent mildly enlarged ileocolonic mesenteric lymph nodes. Otherwise, no definitive evidence of additional spread. -partial colectomy on 11/17/21 with Dr. Maisie Fus showed 5.6 cm invasive moderately differentiated adenocarcinoma invading into pericolic adipose. Margins negative, but metastatic carcinoma in 6 lymph nodes (6/15). Two additional tubular adenomas without high-grade dysplasia noted. MMR normal and MSI stable.  -given his multiple (6) positive lymph nodes, he completed 12 cycles adjuvant chemotherapy with FOLFOX 12/23/21 - 06/23/22. He tolerated well overall with mild side effects. 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 -Surveillance CT scan from June 15, 2023 showed no definitive evidence of cancer recurrence, there is small nonspecific bone lesions especially in the pelvic bone, stable from previous PET scan and did not FDG avid, will consider SPEP test to rule out multiple myeloma. -He is clinically doing well, no clinical suspicion for recurrence.  Will continue surveillance.     PLAN: -CT scan reviewed, NED -will do lab SEPE and light chain level today  -lab and f/u in 4 months, next scan in one year   SUMMARY OF ONCOLOGIC HISTORY: Oncology History Overview Note   Cancer  Staging  Colorectal cancer [facility]) Staging form: Colon and Rectum, AJCC 8th Edition - Pathologic stage from 11/17/2021: Stage IIIB (pT3, pN2a, cM0) - Signed by Malachy Mood, MD on 12/03/2021    Cancer of right colon [facility])  10/13/2021 Procedure   Upper GI endoscopy by Dr. Christella Hartigan for IDA and abdominal pain  Findings:  - The esophagus was normal. - The stomach was normal. - The examined duodenum was normal.   10/13/2021 Procedure   Colonoscopy Indications: Iron deficiency  - A fungating and ulcerated partially obstructing, clearly malignant mass was found at the hepatic flexure. The mass was circumferential and measured 4cm in length. Biopsies were taken with a cold forceps for histology and then the mucosa just distal to the mass was tattooed with an injection of Spot (carbon black). jar 1 Findings: - Two pedunculated polyps were found in the mid transverse colon. The polyps were 8 to 11 mm in size. These polyps were removed with a hot snare. Resection and retrieval were complete. jar 2 - Five pedunculated and sessile polyps were found in the distal transverse colon. The polyps were 4 to 14 mm in size. Two were removed with hot snare, three were removed with cold snare. Resection and retrieval were complete. jar 3 - A 20 mm polyp was found in the distal transverse colon. The polyp was pedunculated. The polyp was removed with a hot snare. Resection and retrieval were complete. jar 4 - The exam was otherwise without abnormality on direct and retroflexion views.   10/13/2021 Pathology Results   REPORT OF SURGICAL PATHOLOGY Accession: [id number]  Diagnosis 1. Hepatic Flexure Biopsy - ADENOCARCINOMA, SEE NOTE   10/21/2021 Imaging   CT  CHEST ABDOMEN PELVIS W CONTRAST  IMPRESSION: 1. There is a circumferential mass of the mid ascending colon measuring approximately 5 cm in length, in keeping with newly diagnosed primary colon malignancy. 2. Adjacent to the colon mass, there is  mesenteric fat stranding suggesting invasion beyond the muscularis as well as abnormally enlarged mesocolonic lymph nodes. 3. Mildly prominent, asymmetric right external iliac lymph nodes, nonspecific although suspicious for early nodal metastatic disease. 4. Asymmetrically prominent left axillary lymph nodes, nonspecific, these nodes less favored to reflect metastatic disease given location in the absence of other evidence of metastatic disease above the diaphragm. Both external iliac and axillary lymph nodes could be characterized for abnormal metabolic activity by PET-CT. 5. No evidence of organ metastatic disease in the chest, abdomen, or pelvis. 6. Exophytic soft tissue attenuation lesion of the anterior midportion of the left kidney measuring 1.2 cm. Partially exophytic soft tissue attenuation lesion of the posteroinferior pole of the left kidney measuring 1.9 x 1.7 cm. These may reflect hemorrhagic or proteinaceous cysts but are incompletely characterized and incidental renal cell carcinoma is not excluded. Recommend initial renal ultrasound to establish definitively cystic character, multiphasic contrast enhanced CT or MRI may however be further required to exclude malignancy. 7. Prostatomegaly with thickening of the decompressed urinary bladder wall, likely due to chronic outlet obstruction.   Aortic Atherosclerosis (ICD10-I70.0).   10/28/2021 Initial Diagnosis   Colorectal cancer (HCC)   11/17/2021 Cancer Staging   Staging form: Colon and Rectum, AJCC 8th Edition - Pathologic stage from 11/17/2021: Stage IIIB (pT3, pN2a, cM0) - Signed by Malachy Mood, MD on 12/03/2021 Histologic grading system: 4 grade system Histologic grade (G): G2 Residual tumor (R): R0 - None   11/17/2021 Definitive Surgery   FINAL MICROSCOPIC DIAGNOSIS:   A. COLON, RIGHT, RESECTION:  Invasive moderately differentiated adenocarcinoma invading into  pericolic adipose (pT3)  Tumor measures 5.2 x 3.6 x 0.8  cm  Margins free of carcinoma and adenomatous change  Six of fifteen pericolic lymph nodes with metastatic carcinoma (6/15, pN2a)  Two additional tubular adenomas without high-grade dysplasia, largest measuring 1.3 cm  Benign appendix   ADDENDUM:  Mismatch Repair Protein (IHC)  SUMMARY INTERPRETATION: NORMAL  Microsatellite Instability (MSI) Result: STABLE   12/23/2021 -  Chemotherapy   Patient is on Treatment Plan : COLORECTAL FOLFOX q14d x 6 months     11/25/2022 Imaging    IMPRESSION: 1. No definitive findings to suggest metastatic disease in the chest, abdomen or pelvis. Prominent borderline enlarged lymph nodes in the portacaval and hepatoduodenal ligament, stable compared to the prior examination, nonspecific. Continued attention on follow-up studies is recommended. 2. Multiple prominent borderline enlarged left axillary lymph nodes, stable compared to prior studies, nonspecific but favored to be benign given their stability in size. 3. Colonic diverticulosis without evidence of acute diverticulitis at this time. 4. Aortic atherosclerosis. 5. Additional incidental findings, as above.      INTERVAL HISTORY:  George Watkins is here for a follow up of colorectal cancer  He was last seen by me on December 08, 2022.  He is clinically doing well overall, has mild low back pain which is chronic.  It is intermittent usually when he stands for long time.  He denies any other discomfort, appetite is good, he has gained some weight lately.  No nausea, bloating, or issue with bowel movement.  All other systems were reviewed with the patient and are negative.  MEDICAL HISTORY:  Past Medical History:  Diagnosis Date  Anemia    Arthritis    Colon cancer (HCC)    Coronary artery disease    GERD (gastroesophageal reflux disease)    Heart murmur    Hiatal hernia    Hypertension    Pneumonia    Sleep apnea     SURGICAL HISTORY: Past Surgical History:  Procedure Laterality  Date   ANKLE SURGERY     CIRCUMCISION     COLONOSCOPY     HERNIA REPAIR     PORTACATH PLACEMENT N/A 12/09/2021   Procedure: INSERTION PORT-A-CATH with ultrasound;  Surgeon: Romie Levee, MD;  Location: [facility];  Service: General;  Laterality: N/A;    I have reviewed the social history and family history with the patient and they are unchanged from previous note.  ALLERGIES:  is allergic to iodinated contrast media and sulfa antibiotics.  MEDICATIONS:  Current Outpatient Medications  Medication Sig Dispense Refill   amLODipine (NORVASC) 5 MG tablet Take 5 mg by mouth daily.     atorvastatin (LIPITOR) 10 MG tablet Take 10 mg by mouth at bedtime.     cholecalciferol (VITAMIN D3) 25 MCG (1000 UNIT) tablet Take 1,000 Units by mouth daily.     cloNIDine (CATAPRES) 0.2 MG tablet Take 0.2 mg by mouth 2 (two) times daily.     cyclobenzaprine (FLEXERIL) 5 MG tablet Take 5 mg by mouth at bedtime as needed for muscle spasms. (Patient not taking: Reported on 03/23/2022)     diphenhydrAMINE (BENADRYL) 50 MG tablet Take 1 tablet (50 mg total) by mouth at bedtime as needed for itching. Take 1 tablet by mouth 1 hour prior to scan 1 tablet 0   ferrous sulfate 325 (65 FE) MG tablet Take 1 tablet (325 mg total) by mouth 2 (two) times daily with a meal. 30 tablet 0   hydrochlorothiazide (HYDRODIURIL) 12.5 MG tablet Take 1 tablet by mouth daily.     KLOR-CON M10 10 MEQ tablet TAKE 1 TABLET BY MOUTH TWICE A DAY 180 tablet 1   labetalol (NORMODYNE) 200 MG tablet Take 200 mg by mouth 2 (two) times daily.     lidocaine-prilocaine (EMLA) cream Apply to affected area once 30 g 3   losartan (COZAAR) 100 MG tablet Take 0.5 tablets by mouth daily.     losartan-hydrochlorothiazide (HYZAAR) 100-25 MG tablet Take 1 tablet by mouth daily. (Patient not taking: Reported on 02/24/2023)     Multiple Vitamins-Minerals (MULTIVITAMIN MEN 50+ PO) Take 1 tablet by mouth daily.     predniSONE (DELTASONE) 50 MG tablet Take 1 tablet 13  hour, 7 hours, and 1 hour before the scan 3 tablet 0   No current facility-administered medications for this visit.    PHYSICAL EXAMINATION: ECOG PERFORMANCE STATUS: 1 - Symptomatic but completely ambulatory  Vitals:   06/22/23 1241  BP: (!) 111/55  Pulse: (!) 56  Resp: 18  Temp: 98 F (36.7 C)  SpO2: 100%    Wt Readings from Last 3 Encounters:  06/22/23 230 lb 8 oz (104.6 kg)  03/23/23 218 lb 6.4 oz (99.1 kg)  02/24/23 216 lb (98 kg)     GENERAL:alert, no distress and comfortable SKIN: skin color normal, no rashes or significant lesions EYES: normal, Conjunctiva are pink and non-injected, sclera clear  NEURO: alert & oriented x 3 with fluent speech  LABORATORY DATA:  I have reviewed the data as listed    Latest Ref Rng & Units 06/15/2023   11:47 AM 03/23/2023   11:09 AM 12/01/2022  10:36 AM  CBC  WBC 4.0 - 10.5 K/uL 11.9  7.0  5.7   Hemoglobin 13.0 - 17.0 g/dL 44.0  10.2  72.5   Hematocrit 39.0 - 52.0 % 40.0  36.8  37.2   Platelets 150 - 400 K/uL 230  177  201         Latest Ref Rng & Units 06/15/2023   11:47 AM 03/23/2023   11:09 AM 12/01/2022   10:36 AM  CMP  Glucose 70 - 99 mg/dL 366  440  90   BUN 8 - 23 mg/dL 15  12  12    Creatinine 0.61 - 1.24 mg/dL 3.47  4.25  9.56   Sodium 135 - 145 mmol/L 138  139  137   Potassium 3.5 - 5.1 mmol/L 4.0  3.3  3.5   Chloride 98 - 111 mmol/L 102  104  103   CO2 22 - 32 mmol/L 29  31  31    Calcium 8.9 - 10.3 mg/dL 9.4  8.7  9.1   Total Protein 6.5 - 8.1 g/dL 8.3  7.5  6.9   Total Bilirubin 0.3 - 1.2 mg/dL 0.7  1.0  0.7   Alkaline Phos 38 - 126 U/L 85  68  86   AST 15 - 41 U/L 17  18  23    ALT 0 - 44 U/L 17  17  23        RADIOGRAPHIC STUDIES: I have personally reviewed the radiological images as listed and agreed with the findings in the report. No results found.    Orders Placed This Encounter  Procedures   Multiple Myeloma Panel (SPEP&IFE w/QIG)    Standing Status:   Future    Number of Occurrences:   1     Standing Expiration Date:   06/21/2024   Kappa/lambda light chains    Standing Status:   Future    Number of Occurrences:   1    Standing Expiration Date:   06/21/2024   All questions were answered. The patient knows to call the clinic with any problems, questions or concerns. No barriers to learning was detected. The total time spent in the appointment was 30 minutes.     Malachy Mood, MD 06/22/2023

## 2023-06-22 NOTE — Assessment & Plan Note (Addendum)
pT3N2aMx, at least stage IIIB, MSS -Colonoscopy performed on 10/13/21 for IDA and abdominal pain showed 4 cm mass near hepatic flexure. Path confirmed adenocarcinoma. -Baseline CEA was elevated at 25.9 on 10/13/21 -PET scan on 11/09/21 showed: hypermetabolism to colon mass and adjacent mildly enlarged ileocolonic mesenteric lymph nodes. Otherwise, no definitive evidence of additional spread. -partial colectomy on 11/17/21 with Dr. Maisie Fus showed 5.6 cm invasive moderately differentiated adenocarcinoma invading into pericolic adipose. Margins negative, but metastatic carcinoma in 6 lymph nodes (6/15). Two additional tubular adenomas without high-grade dysplasia noted. MMR normal and MSI stable.  -given his multiple (6) positive lymph nodes, he completed 12 cycles adjuvant chemotherapy with FOLFOX 12/23/21 - 06/23/22. He tolerated well overall with mild side effects. 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 -Surveillance CT scan from June 15, 2023 showed no definitive evidence of cancer recurrence, there is small nonspecific bone lesions especially in the pelvic bone, stable from previous PET scan and did not FDG avid, will consider SPEP test to rule out multiple myeloma. -He is clinically doing well, no clinical suspicion for recurrence.  Will continue surveillance.

## 2023-06-22 NOTE — Progress Notes (Signed)
Open in error

## 2023-06-23 LAB — KAPPA/LAMBDA LIGHT CHAINS
Kappa free light chain: 41.3 mg/L — ABNORMAL HIGH (ref 3.3–19.4)
Kappa, lambda light chain ratio: 1.66 — ABNORMAL HIGH (ref 0.26–1.65)
Lambda free light chains: 24.9 mg/L (ref 5.7–26.3)

## 2023-06-23 LAB — CEA (ACCESS): CEA (CHCC): <1 ng/mL (ref 0.00–5.00)

## 2023-06-25 ENCOUNTER — Other Ambulatory Visit: Payer: Self-pay

## 2023-06-27 LAB — MULTIPLE MYELOMA PANEL, SERUM
Albumin SerPl Elph-Mcnc: 3.1 g/dL (ref 2.9–4.4)
Albumin/Glob SerPl: 0.9 (ref 0.7–1.7)
Alpha 1: 0.3 g/dL (ref 0.0–0.4)
Alpha2 Glob SerPl Elph-Mcnc: 0.7 g/dL (ref 0.4–1.0)
B-Globulin SerPl Elph-Mcnc: 1 g/dL (ref 0.7–1.3)
Gamma Glob SerPl Elph-Mcnc: 1.6 g/dL (ref 0.4–1.8)
Globulin, Total: 3.6 g/dL (ref 2.2–3.9)
IgA: 280 mg/dL (ref 61–437)
IgG (Immunoglobin G), Serum: 1657 mg/dL — ABNORMAL HIGH (ref 603–1613)
IgM (Immunoglobulin M), Srm: 173 mg/dL — ABNORMAL HIGH (ref 15–143)
Total Protein ELP: 6.7 g/dL (ref 6.0–8.5)

## 2023-07-31 ENCOUNTER — Other Ambulatory Visit: Payer: Self-pay

## 2023-09-24 ENCOUNTER — Other Ambulatory Visit: Payer: Self-pay

## 2023-10-12 ENCOUNTER — Other Ambulatory Visit: Payer: Medicare HMO

## 2023-10-12 ENCOUNTER — Ambulatory Visit: Payer: Medicare HMO | Admitting: Hematology

## 2023-10-13 ENCOUNTER — Inpatient Hospital Stay: Payer: Medicare HMO | Attending: Nurse Practitioner

## 2023-10-13 ENCOUNTER — Inpatient Hospital Stay: Payer: Medicare HMO | Admitting: Nurse Practitioner

## 2023-10-13 ENCOUNTER — Telehealth: Payer: Self-pay

## 2023-10-13 NOTE — Telephone Encounter (Signed)
LM for pt to call the office to reschedule today's no show appt.

## 2023-10-23 ENCOUNTER — Encounter: Payer: Self-pay | Admitting: Hematology

## 2023-11-06 ENCOUNTER — Encounter: Payer: Self-pay | Admitting: Hematology

## 2023-11-07 ENCOUNTER — Other Ambulatory Visit: Payer: Self-pay

## 2023-11-09 ENCOUNTER — Encounter: Payer: Self-pay | Admitting: Hematology

## 2023-11-13 ENCOUNTER — Other Ambulatory Visit: Payer: Medicare HMO

## 2023-11-13 ENCOUNTER — Ambulatory Visit: Payer: Medicare HMO | Admitting: Nurse Practitioner

## 2023-11-15 NOTE — Assessment & Plan Note (Addendum)
pT3N2aMx, at least stage IIIB, MSS -Colonoscopy performed on 10/13/21 for IDA and abdominal pain showed 4 cm mass near hepatic flexure. Path confirmed adenocarcinoma. -Baseline CEA was elevated at 25.9 on 10/13/21 -PET scan on 11/09/21 showed: hypermetabolism to colon mass and adjacent mildly enlarged ileocolonic mesenteric lymph nodes. Otherwise, no definitive evidence of additional spread. -partial colectomy on 11/17/21 with Dr. Maisie Fus showed 5.6 cm invasive moderately differentiated adenocarcinoma invading into pericolic adipose. Margins negative, but metastatic carcinoma in 6 lymph nodes (6/15). Two additional tubular adenomas without high-grade dysplasia noted. MMR normal and MSI stable.  -given his multiple (6) positive lymph nodes, he completed 12 cycles adjuvant chemotherapy with FOLFOX 12/23/21 - 06/23/22. He tolerated well overall with mild side effects. 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 -Surveillance CT scan from June 15, 2023 showed no definitive evidence of cancer recurrence, there is small nonspecific bone lesions especially in the pelvic bone, stable from previous PET scan and did not FDG avid, will consider SPEP test to rule out multiple myeloma. --05/2023 -kappa free light chains were elevated at 41.5; lambda free light chains were normal at 24.9; kappa/lambda free light chain ratio was elevated at 1.66.  Multiple myeloma panel was negative for M protein spike. -He is clinically doing well, no clinical suspicion for recurrence.  Will continue surveillance.

## 2023-11-15 NOTE — Progress Notes (Signed)
Patient Care Team: Loura Back, NP as PCP - General (Nurse Practitioner) Malachy Mood, MD as Consulting Physician (Oncology)  Clinic Day:  11/16/2023  Referring physician: Loura Back, NP  ASSESSMENT & PLAN:   Assessment & Plan: Cancer of right colon (HCC) pT3N2aMx, at least stage IIIB, MSS -Colonoscopy performed on 10/13/21 for IDA and abdominal pain showed 4 cm mass near hepatic flexure. Path confirmed adenocarcinoma. -Baseline CEA was elevated at 25.9 on 10/13/21 -PET scan on 11/09/21 showed: hypermetabolism to colon mass and adjacent mildly enlarged ileocolonic mesenteric lymph nodes. Otherwise, no definitive evidence of additional spread. -partial colectomy on 11/17/21 with Dr. Maisie Fus showed 5.6 cm invasive moderately differentiated adenocarcinoma invading into pericolic adipose. Margins negative, but metastatic carcinoma in 6 lymph nodes (6/15). Two additional tubular adenomas without high-grade dysplasia noted. MMR normal and MSI stable.  -given his multiple (6) positive lymph nodes, he completed 12 cycles adjuvant chemotherapy with FOLFOX 12/23/21 - 06/23/22. He tolerated well overall with mild side effects. 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 -Surveillance CT scan from June 15, 2023 showed no definitive evidence of cancer recurrence, there is small nonspecific bone lesions especially in the pelvic bone, stable from previous PET scan and did not FDG avid, will consider SPEP test to rule out multiple myeloma. --05/2023 -kappa free light chains were elevated at 41.5; lambda free light chains were normal at 24.9; kappa/lambda free light chain ratio was elevated at 1.66.  Multiple myeloma panel was negative for M protein spike. -He is clinically doing well, no clinical suspicion for recurrence.  Will continue surveillance.   Plan: Labs reviewed  -CBC showing WBC 6.9; Hgb 11.3; Hct 37.2; Plt 192; Anc 4.3 -CMP - K 3.5; glucose 252; BUN 13; Creatinine 1.25; eGFR > 60; Ca 8.8;  LFTs normal.   -Reviewed multiple myeloma workup from August 2024.  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  Will continue to monitor. Clinically, no evidence of recurrence. Scheduled for port flush every 2 months. Port flush, labs, and follow-up in 4 months.  The patient understands the plans discussed today and is in agreement with them.  He knows to contact our office if he develops concerns prior to his next appointment.  I provided 25 minutes of face-to-face time during this encounter and > 50% was spent counseling as documented under my assessment and plan.    Carlean Jews, NP  [facility] - A DEPT [facility] [address] [place] [address] Dept: [phone] Dept Fax: [phone]   No orders of the defined types were placed in this encounter.     CHIEF COMPLAINT:  CC: Cancer of right colon  Current Treatment: Surveillance  INTERVAL HISTORY:  George Watkins is here today for repeat clinical assessment.  He was last seen by Dr. Mosetta Putt on 06/22/2023.  Clinically doing well.  Does have chronic low back pain.  Back pain managed with Tylenol.  He denies chest pain, chest pressure, or shortness of breath. He denies headaches or visual disturbances. He denies abdominal pain, nausea, vomiting, or changes in bowel or bladder habits.  He denies fevers or chills. He denies pain. His appetite is good. His weight has increased 14 pounds over last 6 months .  I have reviewed the past medical history, past surgical history, social history and family history with the patient and they are unchanged from previous note.  ALLERGIES:  is  allergic to iodinated contrast media and sulfa antibiotics.  MEDICATIONS:  Current Outpatient Medications  Medication Sig Dispense Refill   amLODipine (NORVASC) 5 MG tablet Take 5 mg by mouth daily.     atorvastatin (LIPITOR) 10 MG tablet Take 10 mg by  mouth at bedtime.     cholecalciferol (VITAMIN D3) 25 MCG (1000 UNIT) tablet Take 1,000 Units by mouth daily.     cloNIDine (CATAPRES) 0.2 MG tablet Take 0.2 mg by mouth 2 (two) times daily.     hydrochlorothiazide (HYDRODIURIL) 12.5 MG tablet Take 1 tablet by mouth daily.     KLOR-CON M10 10 MEQ tablet TAKE 1 TABLET BY MOUTH TWICE A DAY 180 tablet 1   labetalol (NORMODYNE) 200 MG tablet Take 200 mg by mouth 2 (two) times daily.     losartan (COZAAR) 100 MG tablet Take 0.5 tablets by mouth daily.     Multiple Vitamins-Minerals (MULTIVITAMIN MEN 50+ PO) Take 1 tablet by mouth daily.     cyclobenzaprine (FLEXERIL) 5 MG tablet Take 5 mg by mouth at bedtime as needed for muscle spasms. (Patient not taking: Reported on 03/23/2022)     diphenhydrAMINE (BENADRYL) 50 MG tablet Take 1 tablet (50 mg total) by mouth at bedtime as needed for itching. Take 1 tablet by mouth 1 hour prior to scan (Patient not taking: Reported on 11/16/2023) 1 tablet 0   ferrous sulfate 325 (65 FE) MG tablet Take 1 tablet (325 mg total) by mouth 2 (two) times daily with a meal. (Patient not taking: Reported on 11/16/2023) 30 tablet 0   lidocaine-prilocaine (EMLA) cream Apply to affected area once 30 g 3   losartan-hydrochlorothiazide (HYZAAR) 100-25 MG tablet Take 1 tablet by mouth daily. (Patient not taking: Reported on 02/24/2023)     predniSONE (DELTASONE) 50 MG tablet Take 1 tablet 13 hour, 7 hours, and 1 hour before the scan (Patient not taking: Reported on 11/16/2023) 3 tablet 0   No current facility-administered medications for this visit.    HISTORY OF PRESENT ILLNESS:   Oncology History Overview Note   Cancer Staging  Colorectal cancer [facility]) Staging form: Colon and Rectum, AJCC 8th Edition - Pathologic stage from 11/17/2021: Stage IIIB (pT3, pN2a, cM0) - Signed by Malachy Mood, MD on 12/03/2021    Cancer of right colon [facility])  10/13/2021 Procedure   Upper GI endoscopy by Dr. Christella Hartigan for IDA and abdominal pain  Findings:   - The esophagus was normal. - The stomach was normal. - The examined duodenum was normal.   10/13/2021 Procedure   Colonoscopy Indications: Iron deficiency  - A fungating and ulcerated partially obstructing, clearly malignant mass was found at the hepatic flexure. The mass was circumferential and measured 4cm in length. Biopsies were taken with a cold forceps for histology and then the mucosa just distal to the mass was tattooed with an injection of Spot (carbon black). jar 1 Findings: - Two pedunculated polyps were found in the mid transverse colon. The polyps were 8 to 11 mm in size. These polyps were removed with a hot snare. Resection and retrieval were complete. jar 2 - Five pedunculated and sessile polyps were found in the distal transverse colon. The polyps were 4 to 14 mm in size. Two were removed with hot snare, three were removed with cold snare. Resection and retrieval were complete. jar 3 - A 20 mm polyp was found in the distal transverse colon. The polyp was pedunculated. The polyp was removed with a hot snare. Resection and  retrieval were complete. jar 4 - The exam was otherwise without abnormality on direct and retroflexion views.   10/13/2021 Pathology Results   REPORT OF SURGICAL PATHOLOGY Accession: [id number]  Diagnosis 1. Hepatic Flexure Biopsy - ADENOCARCINOMA, SEE NOTE   10/21/2021 Imaging   CT CHEST ABDOMEN PELVIS W CONTRAST  IMPRESSION: 1. There is a circumferential mass of the mid ascending colon measuring approximately 5 cm in length, in keeping with newly diagnosed primary colon malignancy. 2. Adjacent to the colon mass, there is mesenteric fat stranding suggesting invasion beyond the muscularis as well as abnormally enlarged mesocolonic lymph nodes. 3. Mildly prominent, asymmetric right external iliac lymph nodes, nonspecific although suspicious for early nodal metastatic disease. 4. Asymmetrically prominent left axillary lymph nodes,  nonspecific, these nodes less favored to reflect metastatic disease given location in the absence of other evidence of metastatic disease above the diaphragm. Both external iliac and axillary lymph nodes could be characterized for abnormal metabolic activity by PET-CT. 5. No evidence of organ metastatic disease in the chest, abdomen, or pelvis. 6. Exophytic soft tissue attenuation lesion of the anterior midportion of the left kidney measuring 1.2 cm. Partially exophytic soft tissue attenuation lesion of the posteroinferior pole of the left kidney measuring 1.9 x 1.7 cm. These may reflect hemorrhagic or proteinaceous cysts but are incompletely characterized and incidental renal cell carcinoma is not excluded. Recommend initial renal ultrasound to establish definitively cystic character, multiphasic contrast enhanced CT or MRI may however be further required to exclude malignancy. 7. Prostatomegaly with thickening of the decompressed urinary bladder wall, likely due to chronic outlet obstruction.   Aortic Atherosclerosis (ICD10-I70.0).   10/28/2021 Initial Diagnosis   Colorectal cancer (HCC)   11/17/2021 Cancer Staging   Staging form: Colon and Rectum, AJCC 8th Edition - Pathologic stage from 11/17/2021: Stage IIIB (pT3, pN2a, cM0) - Signed by Malachy Mood, MD on 12/03/2021 Histologic grading system: 4 grade system Histologic grade (G): G2 Residual tumor (R): R0 - None   11/17/2021 Definitive Surgery   FINAL MICROSCOPIC DIAGNOSIS:   A. COLON, RIGHT, RESECTION:  Invasive moderately differentiated adenocarcinoma invading into  pericolic adipose (pT3)  Tumor measures 5.2 x 3.6 x 0.8 cm  Margins free of carcinoma and adenomatous change  Six of fifteen pericolic lymph nodes with metastatic carcinoma (6/15, pN2a)  Two additional tubular adenomas without high-grade dysplasia, largest measuring 1.3 cm  Benign appendix   ADDENDUM:  Mismatch Repair Protein (IHC)  SUMMARY INTERPRETATION:  NORMAL  Microsatellite Instability (MSI) Result: STABLE   12/23/2021 -  Chemotherapy   Patient is on Treatment Plan : COLORECTAL FOLFOX q14d x 6 months     11/25/2022 Imaging    IMPRESSION: 1. No definitive findings to suggest metastatic disease in the chest, abdomen or pelvis. Prominent borderline enlarged lymph nodes in the portacaval and hepatoduodenal ligament, stable compared to the prior examination, nonspecific. Continued attention on follow-up studies is recommended. 2. Multiple prominent borderline enlarged left axillary lymph nodes, stable compared to prior studies, nonspecific but favored to be benign given their stability in size. 3. Colonic diverticulosis without evidence of acute diverticulitis at this time. 4. Aortic atherosclerosis. 5. Additional incidental findings, as above.       REVIEW OF SYSTEMS:   Constitutional: Denies fevers, chills or abnormal weight loss Eyes: Denies blurriness of vision Ears, nose, mouth, throat, and face: Denies mucositis or sore throat Respiratory: Denies cough, dyspnea or wheezes Cardiovascular: Denies palpitation, chest discomfort or lower extremity swelling Gastrointestinal:  Denies nausea, heartburn or change in  bowel habits Skin: Denies abnormal skin rashes Lymphatics: Denies new lymphadenopathy or easy bruising Neurological:Denies numbness, tingling or new weaknesses Behavioral/Psych: Mood is stable, no new changes  All other systems were reviewed with the patient and are negative.   VITALS:   Today's Vitals   11/16/23 1202 11/16/23 1208  BP: (!) 133/93   Pulse: (!) 56   Resp: 16   Temp: 98 F (36.7 C)   TempSrc: Temporal   SpO2: 100%   Weight: 244 lb 12.8 oz (111 kg)   PainSc:  8    Body mass index is 35.63 kg/m.   Wt Readings from Last 3 Encounters:  11/16/23 244 lb 12.8 oz (111 kg)  06/22/23 230 lb 8 oz (104.6 kg)  03/23/23 218 lb 6.4 oz (99.1 kg)    Body mass index is 35.63 kg/m.  Performance status  (ECOG): 0 - Asymptomatic  PHYSICAL EXAM:   GENERAL:alert, no distress and comfortable SKIN: skin color, texture, turgor are normal, no rashes or significant lesions EYES: normal, Conjunctiva are pink and non-injected, sclera clear OROPHARYNX:no exudate, no erythema and lips, buccal mucosa, and tongue normal  NECK: supple, thyroid normal size, non-tender, without nodularity LYMPH:  no palpable lymphadenopathy in the cervical, axillary or inguinal LUNGS: clear to auscultation and percussion with normal breathing effort HEART: regular rate & rhythm and no murmurs and no lower extremity edema ABDOMEN:abdomen soft, non-tender and normal bowel sounds Musculoskeletal:no cyanosis of digits and no clubbing  NEURO: alert & oriented x 3 with fluent speech, no focal motor/sensory deficits  LABORATORY DATA:  I have reviewed the data as listed    Component Value Date/Time   NA 136 11/16/2023 1119   K 3.5 11/16/2023 1119   CL 99 11/16/2023 1119   CO2 30 11/16/2023 1119   GLUCOSE 252 (H) 11/16/2023 1119   BUN 13 11/16/2023 1119   CREATININE 1.25 (H) 11/16/2023 1119   CALCIUM 8.8 (L) 11/16/2023 1119   PROT 7.0 11/16/2023 1119   ALBUMIN 3.5 11/16/2023 1119   AST 16 11/16/2023 1119   ALT 14 11/16/2023 1119   ALKPHOS 81 11/16/2023 1119   BILITOT 1.0 11/16/2023 1119   GFRNONAA >60 11/16/2023 1119   GFRAA >60 05/22/2020 1752    Lab Results  Component Value Date   WBC 6.9 11/16/2023   NEUTROABS 4.3 11/16/2023   HGB 11.3 (L) 11/16/2023   HCT 37.2 (L) 11/16/2023   MCV 87.1 11/16/2023   PLT 192 11/16/2023

## 2023-11-16 ENCOUNTER — Inpatient Hospital Stay (HOSPITAL_BASED_OUTPATIENT_CLINIC_OR_DEPARTMENT_OTHER): Payer: Medicare PPO | Admitting: Nurse Practitioner

## 2023-11-16 ENCOUNTER — Other Ambulatory Visit: Payer: Self-pay | Admitting: Hematology

## 2023-11-16 ENCOUNTER — Inpatient Hospital Stay: Payer: Medicare PPO | Attending: Nurse Practitioner

## 2023-11-16 VITALS — BP 133/93 | HR 56 | Temp 98.0°F | Resp 16 | Wt 244.8 lb

## 2023-11-16 DIAGNOSIS — E611 Iron deficiency: Secondary | ICD-10-CM | POA: Diagnosis not present

## 2023-11-16 DIAGNOSIS — C19 Malignant neoplasm of rectosigmoid junction: Secondary | ICD-10-CM

## 2023-11-16 DIAGNOSIS — D500 Iron deficiency anemia secondary to blood loss (chronic): Secondary | ICD-10-CM

## 2023-11-16 DIAGNOSIS — Z9049 Acquired absence of other specified parts of digestive tract: Secondary | ICD-10-CM | POA: Diagnosis not present

## 2023-11-16 DIAGNOSIS — C182 Malignant neoplasm of ascending colon: Secondary | ICD-10-CM | POA: Diagnosis not present

## 2023-11-16 DIAGNOSIS — Z9221 Personal history of antineoplastic chemotherapy: Secondary | ICD-10-CM | POA: Insufficient documentation

## 2023-11-16 DIAGNOSIS — Z95828 Presence of other vascular implants and grafts: Secondary | ICD-10-CM

## 2023-11-16 DIAGNOSIS — Z85038 Personal history of other malignant neoplasm of large intestine: Secondary | ICD-10-CM | POA: Diagnosis present

## 2023-11-16 LAB — CEA (ACCESS): CEA (CHCC): <1 ng/mL (ref 0.00–5.00)

## 2023-11-16 LAB — CBC WITH DIFFERENTIAL (CANCER CENTER ONLY)
Abs Immature Granulocytes: 0.02 10*3/uL (ref 0.00–0.07)
Basophils Absolute: 0 10*3/uL (ref 0.0–0.1)
Basophils Relative: 0 %
Eosinophils Absolute: 0.3 10*3/uL (ref 0.0–0.5)
Eosinophils Relative: 4 %
HCT: 37.2 % — ABNORMAL LOW (ref 39.0–52.0)
Hemoglobin: 11.3 g/dL — ABNORMAL LOW (ref 13.0–17.0)
Immature Granulocytes: 0 %
Lymphocytes Relative: 23 %
Lymphs Abs: 1.6 10*3/uL (ref 0.7–4.0)
MCH: 26.5 pg (ref 26.0–34.0)
MCHC: 30.4 g/dL (ref 30.0–36.0)
MCV: 87.1 fL (ref 80.0–100.0)
Monocytes Absolute: 0.7 10*3/uL (ref 0.1–1.0)
Monocytes Relative: 10 %
Neutro Abs: 4.3 10*3/uL (ref 1.7–7.7)
Neutrophils Relative %: 63 %
Platelet Count: 192 10*3/uL (ref 150–400)
RBC: 4.27 MIL/uL (ref 4.22–5.81)
RDW: 13.1 % (ref 11.5–15.5)
WBC Count: 6.9 10*3/uL (ref 4.0–10.5)
nRBC: 0 % (ref 0.0–0.2)

## 2023-11-16 LAB — CMP (CANCER CENTER ONLY)
ALT: 14 U/L (ref 0–44)
AST: 16 U/L (ref 15–41)
Albumin: 3.5 g/dL (ref 3.5–5.0)
Alkaline Phosphatase: 81 U/L (ref 38–126)
Anion gap: 7 (ref 5–15)
BUN: 13 mg/dL (ref 8–23)
CO2: 30 mmol/L (ref 22–32)
Calcium: 8.8 mg/dL — ABNORMAL LOW (ref 8.9–10.3)
Chloride: 99 mmol/L (ref 98–111)
Creatinine: 1.25 mg/dL — ABNORMAL HIGH (ref 0.61–1.24)
GFR, Estimated: >60 mL/min (ref >60)
Glucose, Bld: 252 mg/dL — ABNORMAL HIGH (ref 70–99)
Potassium: 3.5 mmol/L (ref 3.5–5.1)
Sodium: 136 mmol/L (ref 135–145)
Total Bilirubin: 1 mg/dL (ref 0.0–1.2)
Total Protein: 7 g/dL (ref 6.5–8.1)

## 2023-11-16 LAB — FERRITIN: Ferritin: 175 ng/mL (ref 24–336)

## 2023-11-16 MED ORDER — SODIUM CHLORIDE 0.9% FLUSH
10.0000 mL | Freq: Once | INTRAVENOUS | Status: AC
  Administered 2023-11-16: 10 mL

## 2023-11-16 MED ORDER — LIDOCAINE-PRILOCAINE 2.5-2.5 % EX CREA: TOPICAL_CREAM | CUTANEOUS | 3 refills | Status: DC

## 2023-11-16 MED ORDER — HEPARIN SOD (PORK) LOCK FLUSH 100 UNIT/ML IV SOLN
500.0000 [IU] | Freq: Once | INTRAVENOUS | Status: AC
  Administered 2023-11-16: 500 [IU]

## 2023-11-17 ENCOUNTER — Other Ambulatory Visit: Payer: Self-pay

## 2023-11-19 ENCOUNTER — Encounter: Payer: Self-pay | Admitting: Hematology

## 2023-11-19 ENCOUNTER — Encounter: Payer: Self-pay | Admitting: Nurse Practitioner

## 2023-12-02 ENCOUNTER — Other Ambulatory Visit: Payer: Self-pay

## 2024-01-04 ENCOUNTER — Inpatient Hospital Stay: Payer: Medicare PPO | Attending: Nurse Practitioner

## 2024-01-04 DIAGNOSIS — Z95828 Presence of other vascular implants and grafts: Secondary | ICD-10-CM

## 2024-01-04 DIAGNOSIS — C182 Malignant neoplasm of ascending colon: Secondary | ICD-10-CM

## 2024-01-04 DIAGNOSIS — Z85038 Personal history of other malignant neoplasm of large intestine: Secondary | ICD-10-CM | POA: Diagnosis present

## 2024-01-04 MED ORDER — SODIUM CHLORIDE 0.9% FLUSH
10.0000 mL | Freq: Once | INTRAVENOUS | Status: AC
  Administered 2024-01-04: 10 mL

## 2024-01-04 MED ORDER — HEPARIN SOD (PORK) LOCK FLUSH 100 UNIT/ML IV SOLN
500.0000 [IU] | Freq: Once | INTRAVENOUS | Status: AC
Start: 2024-01-04 — End: 2024-01-04
  Administered 2024-01-04: 500 [IU]

## 2024-03-07 ENCOUNTER — Inpatient Hospital Stay (HOSPITAL_BASED_OUTPATIENT_CLINIC_OR_DEPARTMENT_OTHER): Payer: Medicare PPO | Admitting: Nurse Practitioner

## 2024-03-07 ENCOUNTER — Other Ambulatory Visit: Payer: Self-pay | Admitting: Nurse Practitioner

## 2024-03-07 ENCOUNTER — Inpatient Hospital Stay: Payer: Medicare PPO | Attending: Nurse Practitioner

## 2024-03-07 DIAGNOSIS — Z85038 Personal history of other malignant neoplasm of large intestine: Secondary | ICD-10-CM | POA: Insufficient documentation

## 2024-03-07 DIAGNOSIS — Z95828 Presence of other vascular implants and grafts: Secondary | ICD-10-CM

## 2024-03-07 DIAGNOSIS — C19 Malignant neoplasm of rectosigmoid junction: Secondary | ICD-10-CM

## 2024-03-07 DIAGNOSIS — C182 Malignant neoplasm of ascending colon: Secondary | ICD-10-CM

## 2024-03-07 DIAGNOSIS — D472 Monoclonal gammopathy: Secondary | ICD-10-CM

## 2024-03-07 LAB — COMPREHENSIVE METABOLIC PANEL WITH GFR
ALT: 29 U/L (ref 0–44)
AST: 27 U/L (ref 15–41)
Albumin: 3.5 g/dL (ref 3.5–5.0)
Alkaline Phosphatase: 93 U/L (ref 38–126)
Anion gap: 3 — ABNORMAL LOW (ref 5–15)
BUN: 10 mg/dL (ref 8–23)
CO2: 33 mmol/L — ABNORMAL HIGH (ref 22–32)
Calcium: 8.6 mg/dL — ABNORMAL LOW (ref 8.9–10.3)
Chloride: 105 mmol/L (ref 98–111)
Creatinine, Ser: 1.17 mg/dL (ref 0.61–1.24)
GFR, Estimated: >60 mL/min (ref >60)
Glucose, Bld: 112 mg/dL — ABNORMAL HIGH (ref 70–99)
Potassium: 3.3 mmol/L — ABNORMAL LOW (ref 3.5–5.1)
Sodium: 141 mmol/L (ref 135–145)
Total Bilirubin: 1 mg/dL (ref 0.0–1.2)
Total Protein: 6.9 g/dL (ref 6.5–8.1)

## 2024-03-07 LAB — CBC WITH DIFFERENTIAL (CANCER CENTER ONLY)
Abs Immature Granulocytes: 0.03 10*3/uL (ref 0.00–0.07)
Basophils Absolute: 0 10*3/uL (ref 0.0–0.1)
Basophils Relative: 0 %
Eosinophils Absolute: 0.1 10*3/uL (ref 0.0–0.5)
Eosinophils Relative: 2 %
HCT: 34.8 % — ABNORMAL LOW (ref 39.0–52.0)
Hemoglobin: 10.9 g/dL — ABNORMAL LOW (ref 13.0–17.0)
Immature Granulocytes: 0 %
Lymphocytes Relative: 23 %
Lymphs Abs: 2 10*3/uL (ref 0.7–4.0)
MCH: 26.8 pg (ref 26.0–34.0)
MCHC: 31.3 g/dL (ref 30.0–36.0)
MCV: 85.7 fL (ref 80.0–100.0)
Monocytes Absolute: 0.8 10*3/uL (ref 0.1–1.0)
Monocytes Relative: 9 %
Neutro Abs: 5.7 10*3/uL (ref 1.7–7.7)
Neutrophils Relative %: 66 %
Platelet Count: 189 10*3/uL (ref 150–400)
RBC: 4.06 MIL/uL — ABNORMAL LOW (ref 4.22–5.81)
RDW: 14.1 % (ref 11.5–15.5)
WBC Count: 8.6 10*3/uL (ref 4.0–10.5)
nRBC: 0 % (ref 0.0–0.2)

## 2024-03-07 LAB — CEA (ACCESS): CEA (CHCC): <1 ng/mL (ref 0.00–5.00)

## 2024-03-07 MED ORDER — SODIUM CHLORIDE 0.9% FLUSH
10.0000 mL | Freq: Once | INTRAVENOUS | Status: AC
  Administered 2024-03-07: 10 mL

## 2024-03-07 MED ORDER — HEPARIN SOD (PORK) LOCK FLUSH 100 UNIT/ML IV SOLN
500.0000 [IU] | Freq: Once | INTRAVENOUS | Status: AC
  Administered 2024-03-07: 500 [IU]

## 2024-03-07 NOTE — Progress Notes (Deleted)
 Patient Care Team: Hershell Lose, NP as PCP - General (Nurse Practitioner) Sonja [place], MD as Consulting Physician (Oncology)  Clinic Day:  03/07/2024  Referring physician: Hershell Lose, NP  ASSESSMENT & PLAN:   Assessment & Plan: No problem-specific Assessment & Plan notes found for this encounter.    The patient understands the plans discussed today and is in agreement with them.  He knows to contact our office if he develops concerns prior to his next appointment.  I provided *** minutes of face-to-face time during this encounter and > 50% was spent counseling as documented under my assessment and plan.    Sharyon Deis, NP  [facility] - A DEPT OF [facility] [address] [place] [address] Dept: [phone] Dept Fax: [phone]   No orders of the defined types were placed in this encounter.     CHIEF COMPLAINT:  CC: cancer of right colon   Current Treatment:  ***  INTERVAL HISTORY:  George Watkins is here today for repeat clinical assessment. He denies fevers or chills. He denies pain. His appetite is good. His weight {Weight change:10426}.  I have reviewed the past medical history, past surgical history, social history and family history with the patient and they are unchanged from previous note.  ALLERGIES:  is allergic to iodinated contrast media and sulfa antibiotics.  MEDICATIONS:  Current Outpatient Medications  Medication Sig Dispense Refill   amLODipine  (NORVASC ) 5 MG tablet Take 5 mg by mouth daily.     atorvastatin (LIPITOR) 10 MG tablet Take 10 mg by mouth at bedtime.     cholecalciferol (VITAMIN D3) 25 MCG (1000 UNIT) tablet Take 1,000 Units by mouth daily.     cloNIDine  (CATAPRES ) 0.2 MG tablet Take 0.2 mg by mouth 2 (two) times daily.     cyclobenzaprine  (FLEXERIL ) 5 MG tablet Take 5 mg by mouth at bedtime as needed for muscle spasms. (Patient not taking: Reported on 03/23/2022)      diphenhydrAMINE  (BENADRYL ) 50 MG tablet Take 1 tablet (50 mg total) by mouth at bedtime as needed for itching. Take 1 tablet by mouth 1 hour prior to scan (Patient not taking: Reported on 11/16/2023) 1 tablet 0   ferrous sulfate  325 (65 FE) MG tablet Take 1 tablet (325 mg total) by mouth 2 (two) times daily with a meal. (Patient not taking: Reported on 11/16/2023) 30 tablet 0   hydrochlorothiazide  (HYDRODIURIL ) 12.5 MG tablet Take 1 tablet by mouth daily.     KLOR-CON  M10 10 MEQ tablet TAKE 1 TABLET BY MOUTH TWICE A DAY 180 tablet 1   labetalol  (NORMODYNE ) 200 MG tablet Take 200 mg by mouth 2 (two) times daily.     lidocaine -prilocaine  (EMLA ) cream Apply to affected area once 30 g 3   losartan  (COZAAR ) 100 MG tablet Take 0.5 tablets by mouth daily.     losartan -hydrochlorothiazide  (HYZAAR) 100-25 MG tablet Take 1 tablet by mouth daily. (Patient not taking: Reported on 02/24/2023)     Multiple Vitamins-Minerals (MULTIVITAMIN MEN 50+ PO) Take 1 tablet by mouth daily.     predniSONE  (DELTASONE ) 50 MG tablet Take 1 tablet 13 hour, 7 hours, and 1 hour before the scan (Patient not taking: Reported on 11/16/2023) 3 tablet 0   No current facility-administered medications for this visit.    HISTORY OF PRESENT ILLNESS:   Oncology History Overview Note   Cancer Staging  Colorectal cancer [facility]) Staging form: Colon and Rectum,  AJCC 8th Edition - Pathologic stage from 11/17/2021: Stage IIIB (pT3, pN2a, cM0) - Signed by Sonja [place], MD on 12/03/2021    Cancer of right colon [facility])  10/13/2021 Procedure   Upper GI endoscopy by Dr. Howard Macho for IDA and abdominal pain  Findings:  - The esophagus was normal. - The stomach was normal. - The examined duodenum was normal.   10/13/2021 Procedure   Colonoscopy Indications: Iron deficiency  - A fungating and ulcerated partially obstructing, clearly malignant mass was found at the hepatic flexure. The mass was circumferential and measured 4cm in length. Biopsies  were taken with a cold forceps for histology and then the mucosa just distal to the mass was tattooed with an injection of Spot (carbon black). jar 1 Findings: - Two pedunculated polyps were found in the mid transverse colon. The polyps were 8 to 11 mm in size. These polyps were removed with a hot snare. Resection and retrieval were complete. jar 2 - Five pedunculated and sessile polyps were found in the distal transverse colon. The polyps were 4 to 14 mm in size. Two were removed with hot snare, three were removed with cold snare. Resection and retrieval were complete. jar 3 - A 20 mm polyp was found in the distal transverse colon. The polyp was pedunculated. The polyp was removed with a hot snare. Resection and retrieval were complete. jar 4 - The exam was otherwise without abnormality on direct and retroflexion views.   10/13/2021 Pathology Results   REPORT OF SURGICAL PATHOLOGY Accession: [id number]  Diagnosis 1. Hepatic Flexure Biopsy - ADENOCARCINOMA, SEE NOTE   10/21/2021 Imaging   CT CHEST ABDOMEN PELVIS W CONTRAST  IMPRESSION: 1. There is a circumferential mass of the mid ascending colon measuring approximately 5 cm in length, in keeping with newly diagnosed primary colon malignancy. 2. Adjacent to the colon mass, there is mesenteric fat stranding suggesting invasion beyond the muscularis as well as abnormally enlarged mesocolonic lymph nodes. 3. Mildly prominent, asymmetric right external iliac lymph nodes, nonspecific although suspicious for early nodal metastatic disease. 4. Asymmetrically prominent left axillary lymph nodes, nonspecific, these nodes less favored to reflect metastatic disease given location in the absence of other evidence of metastatic disease above the diaphragm. Both external iliac and axillary lymph nodes could be characterized for abnormal metabolic activity by PET-CT. 5. No evidence of organ metastatic disease in the chest, abdomen,  or pelvis. 6. Exophytic soft tissue attenuation lesion of the anterior midportion of the left kidney measuring 1.2 cm. Partially exophytic soft tissue attenuation lesion of the posteroinferior pole of the left kidney measuring 1.9 x 1.7 cm. These may reflect hemorrhagic or proteinaceous cysts but are incompletely characterized and incidental renal cell carcinoma is not excluded. Recommend initial renal ultrasound to establish definitively cystic character, multiphasic contrast enhanced CT or MRI may however be further required to exclude malignancy. 7. Prostatomegaly with thickening of the decompressed urinary bladder wall, likely due to chronic outlet obstruction.   Aortic Atherosclerosis (ICD10-I70.0).   10/28/2021 Initial Diagnosis   Colorectal cancer (HCC)   11/17/2021 Cancer Staging   Staging form: Colon and Rectum, AJCC 8th Edition - Pathologic stage from 11/17/2021: Stage IIIB (pT3, pN2a, cM0) - Signed by Sonja Hendley, MD on 12/03/2021 Histologic grading system: 4 grade system Histologic grade (G): G2 Residual tumor (R): R0 - None   11/17/2021 Definitive Surgery   FINAL MICROSCOPIC DIAGNOSIS:   A. COLON, RIGHT, RESECTION:  Invasive moderately differentiated adenocarcinoma invading into  pericolic adipose (pT3)  Tumor  measures 5.2 x 3.6 x 0.8 cm  Margins free of carcinoma and adenomatous change  Six of fifteen pericolic lymph nodes with metastatic carcinoma (6/15, pN2a)  Two additional tubular adenomas without high-grade dysplasia, largest measuring 1.3 cm  Benign appendix   ADDENDUM:  Mismatch Repair Protein (IHC)  SUMMARY INTERPRETATION: NORMAL  Microsatellite Instability (MSI) Result: STABLE   12/23/2021 -  Chemotherapy   Patient is on Treatment Plan : COLORECTAL FOLFOX q14d x 6 months     11/25/2022 Imaging    IMPRESSION: 1. No definitive findings to suggest metastatic disease in the chest, abdomen or pelvis. Prominent borderline enlarged lymph nodes in the  portacaval and hepatoduodenal ligament, stable compared to the prior examination, nonspecific. Continued attention on follow-up studies is recommended. 2. Multiple prominent borderline enlarged left axillary lymph nodes, stable compared to prior studies, nonspecific but favored to be benign given their stability in size. 3. Colonic diverticulosis without evidence of acute diverticulitis at this time. 4. Aortic atherosclerosis. 5. Additional incidental findings, as above.       REVIEW OF SYSTEMS:   Constitutional: Denies fevers, chills or abnormal weight loss Eyes: Denies blurriness of vision Ears, nose, mouth, throat, and face: Denies mucositis or sore throat Respiratory: Denies cough, dyspnea or wheezes Cardiovascular: Denies palpitation, chest discomfort or lower extremity swelling Gastrointestinal:  Denies nausea, heartburn or change in bowel habits Skin: Denies abnormal skin rashes Lymphatics: Denies new lymphadenopathy or easy bruising Neurological:Denies numbness, tingling or new weaknesses Behavioral/Psych: Mood is stable, no new changes  All other systems were reviewed with the patient and are negative.   VITALS:   There were no vitals taken for this visit.  Wt Readings from Last 3 Encounters:  11/16/23 244 lb 12.8 oz (111 kg)  06/22/23 230 lb 8 oz (104.6 kg)  03/23/23 218 lb 6.4 oz (99.1 kg)    There is no height or weight on file to calculate BMI.  Performance status (ECOG): {CHL ONC [id number]  PHYSICAL EXAM:   GENERAL:alert, no distress and comfortable SKIN: skin color, texture, turgor are normal, no rashes or significant lesions EYES: normal, Conjunctiva are pink and non-injected, sclera clear OROPHARYNX:no exudate, no erythema and lips, buccal mucosa, and tongue normal  NECK: supple, thyroid normal size, non-tender, without nodularity LYMPH:  no palpable lymphadenopathy in the cervical, axillary or inguinal LUNGS: clear to auscultation and percussion  with normal breathing effort HEART: regular rate & rhythm and no murmurs and no lower extremity edema ABDOMEN:abdomen soft, non-tender and normal bowel sounds Musculoskeletal:no cyanosis of digits and no clubbing  NEURO: alert & oriented x 3 with fluent speech, no focal motor/sensory deficits  LABORATORY DATA:  I have reviewed the data as listed    Component Value Date/Time   NA 136 11/16/2023 1119   K 3.5 11/16/2023 1119   CL 99 11/16/2023 1119   CO2 30 11/16/2023 1119   GLUCOSE 252 (H) 11/16/2023 1119   BUN 13 11/16/2023 1119   CREATININE 1.25 (H) 11/16/2023 1119   CALCIUM  8.8 (L) 11/16/2023 1119   PROT 7.0 11/16/2023 1119   ALBUMIN 3.5 11/16/2023 1119   AST 16 11/16/2023 1119   ALT 14 11/16/2023 1119   ALKPHOS 81 11/16/2023 1119   BILITOT 1.0 11/16/2023 1119   GFRNONAA >60 11/16/2023 1119   GFRAA >60 05/22/2020 1752    No results found for: "SPEP", "UPEP"  Lab Results  Component Value Date   WBC 6.9 11/16/2023   NEUTROABS 4.3 11/16/2023   HGB 11.3 (L) 11/16/2023  HCT 37.2 (L) 11/16/2023   MCV 87.1 11/16/2023   PLT 192 11/16/2023      Chemistry      Component Value Date/Time   NA 136 11/16/2023 1119   K 3.5 11/16/2023 1119   CL 99 11/16/2023 1119   CO2 30 11/16/2023 1119   BUN 13 11/16/2023 1119   CREATININE 1.25 (H) 11/16/2023 1119      Component Value Date/Time   CALCIUM  8.8 (L) 11/16/2023 1119   ALKPHOS 81 11/16/2023 1119   AST 16 11/16/2023 1119   ALT 14 11/16/2023 1119   BILITOT 1.0 11/16/2023 1119       RADIOGRAPHIC STUDIES: I have personally reviewed the radiological images as listed and agreed with the findings in the report. No results found.

## 2024-03-07 NOTE — Progress Notes (Unsigned)
 Patient Care Team: Hershell Lose, NP as PCP - General (Nurse Practitioner) Sonja Barnwell, MD as Consulting Physician (Oncology)  Clinic Day:  03/07/2024  Referring physician: No ref. provider found  ASSESSMENT & PLAN:   Assessment & Plan: No problem-specific Assessment & Plan notes found for this encounter.    The patient understands the plans discussed today and is in agreement with them.  He knows to contact our office if he develops concerns prior to his next appointment.  I provided *** minutes of face-to-face time during this encounter and > 50% was spent counseling as documented under my assessment and plan.    Sharyon Deis, NP  [facility] MED ONC - A DEPT OF [facility] [address] [place] [address] Dept: [phone] Dept Fax: [phone]   Orders Placed This Encounter  Procedures   CEA (Access)    Standing Status:   Future    Expected Date:   03/07/2024    Expiration Date:   03/07/2025   CBC with Differential (Cancer Center Only)    Standing Status:   Future    Expected Date:   03/07/2024    Expiration Date:   03/07/2025   Kappa/lambda light chains    Standing Status:   Future    Expected Date:   03/07/2024    Expiration Date:   03/07/2025   Comprehensive metabolic panel with GFR    Standing Status:   Future    Expected Date:   03/07/2024    Expiration Date:   03/07/2025   Multiple Myeloma Panel (SPEP&IFE w/QIG)    Standing Status:   Future    Expected Date:   03/07/2024    Expiration Date:   03/07/2025      CHIEF COMPLAINT:  CC: ***  Current Treatment:  ***  INTERVAL HISTORY:  George Watkins is here today for repeat clinical assessment. He denies fevers or chills. He denies pain. His appetite is good. His weight {Weight change:10426}.  I have reviewed the past medical history, past surgical history, social history and family history with the patient and they are unchanged from previous  note.  ALLERGIES:  is allergic to iodinated contrast media and sulfa antibiotics.  MEDICATIONS:  Current Outpatient Medications  Medication Sig Dispense Refill   amLODipine  (NORVASC ) 5 MG tablet Take 5 mg by mouth daily.     atorvastatin (LIPITOR) 10 MG tablet Take 10 mg by mouth at bedtime.     cholecalciferol (VITAMIN D3) 25 MCG (1000 UNIT) tablet Take 1,000 Units by mouth daily.     cloNIDine  (CATAPRES ) 0.2 MG tablet Take 0.2 mg by mouth 2 (two) times daily.     cyclobenzaprine  (FLEXERIL ) 5 MG tablet Take 5 mg by mouth at bedtime as needed for muscle spasms. (Patient not taking: Reported on 03/23/2022)     diphenhydrAMINE  (BENADRYL ) 50 MG tablet Take 1 tablet (50 mg total) by mouth at bedtime as needed for itching. Take 1 tablet by mouth 1 hour prior to scan (Patient not taking: Reported on 11/16/2023) 1 tablet 0   ferrous sulfate  325 (65 FE) MG tablet Take 1 tablet (325 mg total) by mouth 2 (two) times daily with a meal. (Patient not taking: Reported on 11/16/2023) 30 tablet 0   hydrochlorothiazide  (HYDRODIURIL ) 12.5 MG tablet Take 1 tablet by mouth daily.     KLOR-CON  M10 10 MEQ tablet TAKE 1 TABLET BY MOUTH TWICE A DAY 180 tablet 1   labetalol  (  NORMODYNE ) 200 MG tablet Take 200 mg by mouth 2 (two) times daily.     lidocaine -prilocaine  (EMLA ) cream Apply to affected area once 30 g 3   losartan  (COZAAR ) 100 MG tablet Take 0.5 tablets by mouth daily.     losartan -hydrochlorothiazide  (HYZAAR) 100-25 MG tablet Take 1 tablet by mouth daily. (Patient not taking: Reported on 02/24/2023)     Multiple Vitamins-Minerals (MULTIVITAMIN MEN 50+ PO) Take 1 tablet by mouth daily.     predniSONE  (DELTASONE ) 50 MG tablet Take 1 tablet 13 hour, 7 hours, and 1 hour before the scan (Patient not taking: Reported on 11/16/2023) 3 tablet 0   No current facility-administered medications for this visit.    HISTORY OF PRESENT ILLNESS:   Oncology History Overview Note   Cancer Staging  Colorectal cancer  [facility]) Staging form: Colon and Rectum, AJCC 8th Edition - Pathologic stage from 11/17/2021: Stage IIIB (pT3, pN2a, cM0) - Signed by Sonja [place]-R[place], MD on 12/03/2021    Cancer of right colon [facility])  10/13/2021 Procedure   Upper GI endoscopy by Dr. Howard Macho for IDA and abdominal pain  Findings:  - The esophagus was normal. - The stomach was normal. - The examined duodenum was normal.   10/13/2021 Procedure   Colonoscopy Indications: Iron deficiency  - A fungating and ulcerated partially obstructing, clearly malignant mass was found at the hepatic flexure. The mass was circumferential and measured 4cm in length. Biopsies were taken with a cold forceps for histology and then the mucosa just distal to the mass was tattooed with an injection of Spot (carbon black). jar 1 Findings: - Two pedunculated polyps were found in the mid transverse colon. The polyps were 8 to 11 mm in size. These polyps were removed with a hot snare. Resection and retrieval were complete. jar 2 - Five pedunculated and sessile polyps were found in the distal transverse colon. The polyps were 4 to 14 mm in size. Two were removed with hot snare, three were removed with cold snare. Resection and retrieval were complete. jar 3 - A 20 mm polyp was found in the distal transverse colon. The polyp was pedunculated. The polyp was removed with a hot snare. Resection and retrieval were complete. jar 4 - The exam was otherwise without abnormality on direct and retroflexion views.   10/13/2021 Pathology Results   REPORT OF SURGICAL PATHOLOGY Accession: [id number]  Diagnosis 1. Hepatic Flexure Biopsy - ADENOCARCINOMA, SEE NOTE   10/21/2021 Imaging   CT CHEST ABDOMEN PELVIS W CONTRAST  IMPRESSION: 1. There is a circumferential mass of the mid ascending colon measuring approximately 5 cm in length, in keeping with newly diagnosed primary colon malignancy. 2. Adjacent to the colon mass, there is mesenteric fat  stranding suggesting invasion beyond the muscularis as well as abnormally enlarged mesocolonic lymph nodes. 3. Mildly prominent, asymmetric right external iliac lymph nodes, nonspecific although suspicious for early nodal metastatic disease. 4. Asymmetrically prominent left axillary lymph nodes, nonspecific, these nodes less favored to reflect metastatic disease given location in the absence of other evidence of metastatic disease above the diaphragm. Both external iliac and axillary lymph nodes could be characterized for abnormal metabolic activity by PET-CT. 5. No evidence of organ metastatic disease in the chest, abdomen, or pelvis. 6. Exophytic soft tissue attenuation lesion of the anterior midportion of the left kidney measuring 1.2 cm. Partially exophytic soft tissue attenuation lesion of the posteroinferior pole of the left kidney measuring 1.9 x 1.7 cm. These may reflect hemorrhagic or proteinaceous cysts  but are incompletely characterized and incidental renal cell carcinoma is not excluded. Recommend initial renal ultrasound to establish definitively cystic character, multiphasic contrast enhanced CT or MRI may however be further required to exclude malignancy. 7. Prostatomegaly with thickening of the decompressed urinary bladder wall, likely due to chronic outlet obstruction.   Aortic Atherosclerosis (ICD10-I70.0).   10/28/2021 Initial Diagnosis   Colorectal cancer (HCC)   11/17/2021 Cancer Staging   Staging form: Colon and Rectum, AJCC 8th Edition - Pathologic stage from 11/17/2021: Stage IIIB (pT3, pN2a, cM0) - Signed by Sonja [place], MD on 12/03/2021 Histologic grading system: 4 grade system Histologic grade (G): G2 Residual tumor (R): R0 - None   11/17/2021 Definitive Surgery   FINAL MICROSCOPIC DIAGNOSIS:   A. COLON, RIGHT, RESECTION:  Invasive moderately differentiated adenocarcinoma invading into  pericolic adipose (pT3)  Tumor measures 5.2 x 3.6 x 0.8 cm  Margins  free of carcinoma and adenomatous change  Six of fifteen pericolic lymph nodes with metastatic carcinoma (6/15, pN2a)  Two additional tubular adenomas without high-grade dysplasia, largest measuring 1.3 cm  Benign appendix   ADDENDUM:  Mismatch Repair Protein (IHC)  SUMMARY INTERPRETATION: NORMAL  Microsatellite Instability (MSI) Result: STABLE   12/23/2021 -  Chemotherapy   Patient is on Treatment Plan : COLORECTAL FOLFOX q14d x 6 months     11/25/2022 Imaging    IMPRESSION: 1. No definitive findings to suggest metastatic disease in the chest, abdomen or pelvis. Prominent borderline enlarged lymph nodes in the portacaval and hepatoduodenal ligament, stable compared to the prior examination, nonspecific. Continued attention on follow-up studies is recommended. 2. Multiple prominent borderline enlarged left axillary lymph nodes, stable compared to prior studies, nonspecific but favored to be benign given their stability in size. 3. Colonic diverticulosis without evidence of acute diverticulitis at this time. 4. Aortic atherosclerosis. 5. Additional incidental findings, as above.       REVIEW OF SYSTEMS:   Constitutional: Denies fevers, chills or abnormal weight loss Eyes: Denies blurriness of vision Ears, nose, mouth, throat, and face: Denies mucositis or sore throat Respiratory: Denies cough, dyspnea or wheezes Cardiovascular: Denies palpitation, chest discomfort or lower extremity swelling Gastrointestinal:  Denies nausea, heartburn or change in bowel habits Skin: Denies abnormal skin rashes Lymphatics: Denies new lymphadenopathy or easy bruising Neurological:Denies numbness, tingling or new weaknesses Behavioral/Psych: Mood is stable, no new changes  All other systems were reviewed with the patient and are negative.   VITALS:   There were no vitals taken for this visit.  Wt Readings from Last 3 Encounters:  11/16/23 244 lb 12.8 oz (111 kg)  06/22/23 230 lb 8 oz  (104.6 kg)  03/23/23 218 lb 6.4 oz (99.1 kg)    There is no height or weight on file to calculate BMI.  Performance status (ECOG): {CHL ONC [id number]  PHYSICAL EXAM:   GENERAL:alert, no distress and comfortable SKIN: skin color, texture, turgor are normal, no rashes or significant lesions EYES: normal, Conjunctiva are pink and non-injected, sclera clear OROPHARYNX:no exudate, no erythema and lips, buccal mucosa, and tongue normal  NECK: supple, thyroid normal size, non-tender, without nodularity LYMPH:  no palpable lymphadenopathy in the cervical, axillary or inguinal LUNGS: clear to auscultation and percussion with normal breathing effort HEART: regular rate & rhythm and no murmurs and no lower extremity edema ABDOMEN:abdomen soft, non-tender and normal bowel sounds Musculoskeletal:no cyanosis of digits and no clubbing  NEURO: alert & oriented x 3 with fluent speech, no focal motor/sensory deficits  LABORATORY DATA:  I  have reviewed the data as listed    Component Value Date/Time   NA 136 11/16/2023 1119   K 3.5 11/16/2023 1119   CL 99 11/16/2023 1119   CO2 30 11/16/2023 1119   GLUCOSE 252 (H) 11/16/2023 1119   BUN 13 11/16/2023 1119   CREATININE 1.25 (H) 11/16/2023 1119   CALCIUM  8.8 (L) 11/16/2023 1119   PROT 7.0 11/16/2023 1119   ALBUMIN 3.5 11/16/2023 1119   AST 16 11/16/2023 1119   ALT 14 11/16/2023 1119   ALKPHOS 81 11/16/2023 1119   BILITOT 1.0 11/16/2023 1119   GFRNONAA >60 11/16/2023 1119   GFRAA >60 05/22/2020 1752    No results found for: "SPEP", "UPEP"  Lab Results  Component Value Date   WBC 6.9 11/16/2023   NEUTROABS 4.3 11/16/2023   HGB 11.3 (L) 11/16/2023   HCT 37.2 (L) 11/16/2023   MCV 87.1 11/16/2023   PLT 192 11/16/2023      Chemistry      Component Value Date/Time   NA 136 11/16/2023 1119   K 3.5 11/16/2023 1119   CL 99 11/16/2023 1119   CO2 30 11/16/2023 1119   BUN 13 11/16/2023 1119   CREATININE 1.25 (H) 11/16/2023 1119       Component Value Date/Time   CALCIUM  8.8 (L) 11/16/2023 1119   ALKPHOS 81 11/16/2023 1119   AST 16 11/16/2023 1119   ALT 14 11/16/2023 1119   BILITOT 1.0 11/16/2023 1119       RADIOGRAPHIC STUDIES: I have personally reviewed the radiological images as listed and agreed with the findings in the report. No results found.

## 2024-03-08 ENCOUNTER — Other Ambulatory Visit: Payer: Self-pay

## 2024-03-08 LAB — KAPPA/LAMBDA LIGHT CHAINS
Kappa free light chain: 48.4 mg/L — ABNORMAL HIGH (ref 3.3–19.4)
Kappa, lambda light chain ratio: 1.69 — ABNORMAL HIGH (ref 0.26–1.65)
Lambda free light chains: 28.6 mg/L — ABNORMAL HIGH (ref 5.7–26.3)

## 2024-03-11 LAB — MULTIPLE MYELOMA PANEL, SERUM
Albumin SerPl Elph-Mcnc: 3.3 g/dL (ref 2.9–4.4)
Albumin/Glob SerPl: 1.1 (ref 0.7–1.7)
Alpha 1: 0.2 g/dL (ref 0.0–0.4)
Alpha2 Glob SerPl Elph-Mcnc: 0.7 g/dL (ref 0.4–1.0)
B-Globulin SerPl Elph-Mcnc: 0.9 g/dL (ref 0.7–1.3)
Gamma Glob SerPl Elph-Mcnc: 1.5 g/dL (ref 0.4–1.8)
Globulin, Total: 3.3 g/dL (ref 2.2–3.9)
IgA: 298 mg/dL (ref 61–437)
IgG (Immunoglobin G), Serum: 1574 mg/dL (ref 603–1613)
IgM (Immunoglobulin M), Srm: 233 mg/dL — ABNORMAL HIGH (ref 15–143)
Total Protein ELP: 6.6 g/dL (ref 6.0–8.5)

## 2024-04-25 ENCOUNTER — Ambulatory Visit: Admitting: Podiatry

## 2024-04-25 ENCOUNTER — Encounter: Payer: Self-pay | Admitting: Podiatry

## 2024-04-25 DIAGNOSIS — B353 Tinea pedis: Secondary | ICD-10-CM

## 2024-04-25 DIAGNOSIS — L84 Corns and callosities: Secondary | ICD-10-CM

## 2024-04-25 DIAGNOSIS — G620 Drug-induced polyneuropathy: Secondary | ICD-10-CM

## 2024-04-25 DIAGNOSIS — M79675 Pain in left toe(s): Secondary | ICD-10-CM | POA: Diagnosis not present

## 2024-04-25 DIAGNOSIS — M79674 Pain in right toe(s): Secondary | ICD-10-CM

## 2024-04-25 DIAGNOSIS — B351 Tinea unguium: Secondary | ICD-10-CM | POA: Diagnosis not present

## 2024-04-25 DIAGNOSIS — T451X5A Adverse effect of antineoplastic and immunosuppressive drugs, initial encounter: Secondary | ICD-10-CM | POA: Diagnosis not present

## 2024-04-25 MED ORDER — KETOCONAZOLE 2 % EX CREA: 1.0000 | TOPICAL_CREAM | Freq: Two times a day (BID) | CUTANEOUS | 2 refills | Status: AC

## 2024-04-25 NOTE — Progress Notes (Signed)
  Subjective:  Patient ID: George Watkins, male    DOB: [birth year],  MRN: [id number]  Chief Complaint  Patient presents with   Callouses    Rm23 Painful callous left foot and requesting nails to be trimmed/not diabetic    73 y.o. male presents with the above complaint. History confirmed with patient.  Still dealing with peripheral neuropathy..  His nails are thickened elongated brown-yellow discolored, he also has a large painful callus on the left foot.  Has a new issue of itching dry skin on the bottom of the foot.  Objective:  Physical Exam: warm, good capillary refill, no trophic changes or ulcerative lesions, normal DP and PT pulses, and abnormal sensory exam with loss of protective sensation to midfoot, subjective paresthesias.  Diffuse moccasin style tinea pedis bilateral Left Foot: dystrophic yellowed discolored nail plates with subungual debris and large porokeratosis submetatarsal 2 Right Foot: dystrophic yellowed discolored nail plates with subungual debris   Assessment:   1. Pain due to onychomycosis of toenails of both feet   2. Callus of foot   3. Chemotherapy-induced peripheral neuropathy (HCC)   4. Tinea pedis of both feet      Plan:  Patient was evaluated and treated and all questions answered.  Discussed the etiology and treatment options for the condition in detail with the patient.  Likely would not be an adequate or effective candidate for topical or oral therapy for nail fungus recommended debridement of the nails today. Sharp and mechanical debridement performed of all painful and mycotic nails today. Nails debrided in length and thickness using a nail nipper to level of comfort. Discussed treatment options including appropriate shoe gear. Follow up as needed for painful nails.   All symptomatic hyperkeratoses were safely debrided with a sterile #15 blade to patient's level of comfort without incident. We discussed preventative and palliative care of these lesions  including supportive and accommodative shoegear, padding, prefabricated and custom molded accommodative orthoses, use of a pumice stone and lotions/creams daily.  Due to his significant neuropathy professional debridement is medically necessary.     Has a new issue of tinea pedis bilateral.  Discussed the etiology and treatment options for tinea pedis.  Discussed topical and oral treatment.  Recommended topical treatment with 2% ketoconazole cream.  This was sent to the patient's pharmacy.  Also discussed appropriate foot hygiene, use of antifungal spray such as Tinactin in shoes, as well as cleaning her foot surfaces such as showers and bathroom floors with bleach.  Return in about 3 months (around 07/26/2024) for RFC.

## 2024-04-26 ENCOUNTER — Other Ambulatory Visit: Payer: Self-pay

## 2024-05-02 ENCOUNTER — Encounter

## 2024-05-02 ENCOUNTER — Other Ambulatory Visit: Payer: Self-pay | Admitting: Nurse Practitioner

## 2024-05-02 ENCOUNTER — Inpatient Hospital Stay: Attending: Nurse Practitioner

## 2024-05-02 ENCOUNTER — Other Ambulatory Visit: Payer: Self-pay

## 2024-05-02 DIAGNOSIS — Z9221 Personal history of antineoplastic chemotherapy: Secondary | ICD-10-CM | POA: Insufficient documentation

## 2024-05-02 DIAGNOSIS — C182 Malignant neoplasm of ascending colon: Secondary | ICD-10-CM

## 2024-05-02 DIAGNOSIS — C19 Malignant neoplasm of rectosigmoid junction: Secondary | ICD-10-CM

## 2024-05-02 DIAGNOSIS — D500 Iron deficiency anemia secondary to blood loss (chronic): Secondary | ICD-10-CM

## 2024-05-02 DIAGNOSIS — Z85038 Personal history of other malignant neoplasm of large intestine: Secondary | ICD-10-CM | POA: Diagnosis present

## 2024-05-02 DIAGNOSIS — D472 Monoclonal gammopathy: Secondary | ICD-10-CM

## 2024-05-02 LAB — CMP (CANCER CENTER ONLY)
ALT: 20 U/L (ref 0–44)
AST: 22 U/L (ref 15–41)
Albumin: 3.5 g/dL (ref 3.5–5.0)
Alkaline Phosphatase: 70 U/L (ref 38–126)
Anion gap: 3 — ABNORMAL LOW (ref 5–15)
BUN: 11 mg/dL (ref 8–23)
CO2: 34 mmol/L — ABNORMAL HIGH (ref 22–32)
Calcium: 9.2 mg/dL (ref 8.9–10.3)
Chloride: 104 mmol/L (ref 98–111)
Creatinine: 1.16 mg/dL (ref 0.61–1.24)
GFR, Estimated: >60 mL/min (ref >60)
Glucose, Bld: 130 mg/dL — ABNORMAL HIGH (ref 70–99)
Potassium: 3.1 mmol/L — ABNORMAL LOW (ref 3.5–5.1)
Sodium: 141 mmol/L (ref 135–145)
Total Bilirubin: 1 mg/dL (ref 0.0–1.2)
Total Protein: 7.4 g/dL (ref 6.5–8.1)

## 2024-05-02 LAB — CBC WITH DIFFERENTIAL (CANCER CENTER ONLY)
Abs Immature Granulocytes: 0.03 K/uL (ref 0.00–0.07)
Basophils Absolute: 0 K/uL (ref 0.0–0.1)
Basophils Relative: 1 %
Eosinophils Absolute: 0.1 K/uL (ref 0.0–0.5)
Eosinophils Relative: 1 %
HCT: 35.1 % — ABNORMAL LOW (ref 39.0–52.0)
Hemoglobin: 11.1 g/dL — ABNORMAL LOW (ref 13.0–17.0)
Immature Granulocytes: 0 %
Lymphocytes Relative: 20 %
Lymphs Abs: 1.7 K/uL (ref 0.7–4.0)
MCH: 26.9 pg (ref 26.0–34.0)
MCHC: 31.6 g/dL (ref 30.0–36.0)
MCV: 85 fL (ref 80.0–100.0)
Monocytes Absolute: 0.7 K/uL (ref 0.1–1.0)
Monocytes Relative: 8 %
Neutro Abs: 6.1 K/uL (ref 1.7–7.7)
Neutrophils Relative %: 70 %
Platelet Count: 233 K/uL (ref 150–400)
RBC: 4.13 MIL/uL — ABNORMAL LOW (ref 4.22–5.81)
RDW: 13.2 % (ref 11.5–15.5)
WBC Count: 8.8 K/uL (ref 4.0–10.5)
nRBC: 0 % (ref 0.0–0.2)

## 2024-05-02 LAB — CEA (ACCESS): CEA (CHCC): <1 ng/mL (ref 0.00–5.00)

## 2024-05-05 ENCOUNTER — Ambulatory Visit: Payer: Self-pay | Admitting: Hematology

## 2024-06-10 ENCOUNTER — Encounter: Payer: Self-pay | Admitting: Hematology

## 2024-06-10 ENCOUNTER — Telehealth: Payer: Self-pay

## 2024-06-10 NOTE — Progress Notes (Signed)
 Patient Care Team: Leontine Cramp, NP as PCP - General (Nurse Practitioner) Lanny Callander, MD as Consulting Physician (Oncology)  Clinic Day:  06/10/2024  Referring physician: Leontine Cramp, NP  ASSESSMENT & PLAN:   Assessment & Plan: No problem-specific Assessment & Plan notes found for this encounter.    The patient understands the plans discussed today and is in agreement with them.  He knows to contact our office if he develops concerns prior to his next appointment.  I provided  minutes of face-to-face time during this encounter and > 50% was spent counseling as documented under my assessment and plan.    George FORBES Lessen, NP  [facility] - A DEPT OF [facility] [address] [place] [address] Dept: [phone] Dept Fax: [phone]   No orders of the defined types were placed in this encounter.     CHIEF COMPLAINT:  CC:   Current Treatment:    INTERVAL HISTORY:  George Watkins is here today for repeat clinical assessment. He denies fevers or chills. He denies pain. His appetite is good. His weight .  I have reviewed the past medical history, past surgical history, social history and family history with the patient and they are unchanged from previous note.  ALLERGIES:  is allergic to iodinated contrast media and sulfa antibiotics.  MEDICATIONS:  Current Outpatient Medications  Medication Sig Dispense Refill   amLODipine  (NORVASC ) 5 MG tablet Take 5 mg by mouth daily.     atorvastatin (LIPITOR) 10 MG tablet Take 10 mg by mouth at bedtime.     cholecalciferol (VITAMIN D3) 25 MCG (1000 UNIT) tablet Take 1,000 Units by mouth daily.     cloNIDine  (CATAPRES ) 0.2 MG tablet Take 0.2 mg by mouth 2 (two) times daily.     cyclobenzaprine  (FLEXERIL ) 5 MG tablet Take 5 mg by mouth at bedtime as needed for muscle spasms. (Patient not taking: Reported on 04/25/2024)     diphenhydrAMINE  (BENADRYL ) 50 MG tablet Take 1 tablet  (50 mg total) by mouth at bedtime as needed for itching. Take 1 tablet by mouth 1 hour prior to scan (Patient not taking: Reported on 04/25/2024) 1 tablet 0   ferrous sulfate  325 (65 FE) MG tablet Take 1 tablet (325 mg total) by mouth 2 (two) times daily with a meal. (Patient not taking: Reported on 04/25/2024) 30 tablet 0   hydrochlorothiazide  (HYDRODIURIL ) 12.5 MG tablet Take 1 tablet by mouth daily.     ketoconazole  (NIZORAL ) 2 % cream Apply 1 Application topically 2 (two) times daily. 60 g 2   KLOR-CON  M10 10 MEQ tablet TAKE 1 TABLET BY MOUTH TWICE A DAY 180 tablet 1   labetalol  (NORMODYNE ) 200 MG tablet Take 200 mg by mouth 2 (two) times daily.     lidocaine -prilocaine  (EMLA ) cream Apply to affected area once 30 g 3   losartan  (COZAAR ) 100 MG tablet Take 0.5 tablets by mouth daily.     losartan -hydrochlorothiazide  (HYZAAR) 100-25 MG tablet Take 1 tablet by mouth daily. (Patient not taking: Reported on 04/25/2024)     Multiple Vitamins-Minerals (MULTIVITAMIN MEN 50+ PO) Take 1 tablet by mouth daily.     predniSONE  (DELTASONE ) 50 MG tablet Take 1 tablet 13 hour, 7 hours, and 1 hour before the scan (Patient not taking: Reported on 04/25/2024) 3 tablet 0   No current facility-administered medications for this visit.    HISTORY OF PRESENT ILLNESS:   Oncology History Overview Note  Cancer Staging  Colorectal cancer [facility]) Staging form: Colon and Rectum, AJCC 8th Edition - Pathologic stage from 11/17/2021: Stage IIIB (pT3, pN2a, cM0) - Signed by Lanny Callander, MD on 12/03/2021    Cancer of right colon [facility])  10/13/2021 Procedure   Upper GI endoscopy by Dr. Teressa for IDA and abdominal pain  Findings:  - The esophagus was normal. - The stomach was normal. - The examined duodenum was normal.   10/13/2021 Procedure   Colonoscopy Indications: Iron deficiency  - A fungating and ulcerated partially obstructing, clearly malignant mass was found at the hepatic flexure. The mass was circumferential and  measured 4cm in length. Biopsies were taken with a cold forceps for histology and then the mucosa just distal to the mass was tattooed with an injection of Spot (carbon black). jar 1 Findings: - Two pedunculated polyps were found in the mid transverse colon. The polyps were 8 to 11 mm in size. These polyps were removed with a hot snare. Resection and retrieval were complete. jar 2 - Five pedunculated and sessile polyps were found in the distal transverse colon. The polyps were 4 to 14 mm in size. Two were removed with hot snare, three were removed with cold snare. Resection and retrieval were complete. jar 3 - A 20 mm polyp was found in the distal transverse colon. The polyp was pedunculated. The polyp was removed with a hot snare. Resection and retrieval were complete. jar 4 - The exam was otherwise without abnormality on direct and retroflexion views.   10/13/2021 Pathology Results   REPORT OF SURGICAL PATHOLOGY Accession: [id number]  Diagnosis 1. Hepatic Flexure Biopsy - ADENOCARCINOMA, SEE NOTE   10/21/2021 Imaging   CT CHEST ABDOMEN PELVIS W CONTRAST  IMPRESSION: 1. There is a circumferential mass of the mid ascending colon measuring approximately 5 cm in length, in keeping with newly diagnosed primary colon malignancy. 2. Adjacent to the colon mass, there is mesenteric fat stranding suggesting invasion beyond the muscularis as well as abnormally enlarged mesocolonic lymph nodes. 3. Mildly prominent, asymmetric right external iliac lymph nodes, nonspecific although suspicious for early nodal metastatic disease. 4. Asymmetrically prominent left axillary lymph nodes, nonspecific, these nodes less favored to reflect metastatic disease given location in the absence of other evidence of metastatic disease above the diaphragm. Both external iliac and axillary lymph nodes could be characterized for abnormal metabolic activity by PET-CT. 5. No evidence of organ metastatic disease  in the chest, abdomen, or pelvis. 6. Exophytic soft tissue attenuation lesion of the anterior midportion of the left kidney measuring 1.2 cm. Partially exophytic soft tissue attenuation lesion of the posteroinferior pole of the left kidney measuring 1.9 x 1.7 cm. These may reflect hemorrhagic or proteinaceous cysts but are incompletely characterized and incidental renal cell carcinoma is not excluded. Recommend initial renal ultrasound to establish definitively cystic character, multiphasic contrast enhanced CT or MRI may however be further required to exclude malignancy. 7. Prostatomegaly with thickening of the decompressed urinary bladder wall, likely due to chronic outlet obstruction.   Aortic Atherosclerosis (ICD10-I70.0).   10/28/2021 Initial Diagnosis   Colorectal cancer (HCC)   11/17/2021 Cancer Staging   Staging form: Colon and Rectum, AJCC 8th Edition - Pathologic stage from 11/17/2021: Stage IIIB (pT3, pN2a, cM0) - Signed by Lanny Callander, MD on 12/03/2021 Histologic grading system: 4 grade system Histologic grade (G): G2 Residual tumor (R): R0 - None   11/17/2021 Definitive Surgery   FINAL MICROSCOPIC DIAGNOSIS:   A. 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 No definitive findings to suggest metastatic disease in the chest, abdomen or pelvis. Prominent borderline enlarged lymph  nodes in the portacaval and hepatoduodenal ligament, stable compared to the prior examination, nonspecific. Continued attention on follow-up studies is recommended. 2. Multiple prominent borderline enlarged left axillary lymph nodes, stable compared to prior studies, nonspecific but favored to be benign given their stability in size. 3. Colonic diverticulosis without evidence of acute diverticulitis at this time. 4. Aortic atherosclerosis. 5. Additional incidental findings, as above.       REVIEW OF SYSTEMS:   Constitutional: Denies fevers, chills or abnormal weight loss Eyes: Denies blurriness of vision Ears, nose, mouth, throat, and face: Denies mucositis or sore throat Respiratory: Denies cough, dyspnea or wheezes Cardiovascular: Denies palpitation, chest discomfort or lower extremity swelling Gastrointestinal:  Denies nausea, heartburn or change in bowel habits Skin: Denies abnormal skin rashes Lymphatics: Denies new lymphadenopathy or easy bruising Neurological:Denies numbness, tingling or new weaknesses Behavioral/Psych: Mood is stable, no new changes  All other systems were reviewed with the patient and are negative.   VITALS:  There were no vitals taken for this visit.  Wt Readings from Last 3 Encounters:  11/16/23 244 lb 12.8 oz (111 kg)  06/22/23 230 lb 8 oz (104.6 kg)  03/23/23 218 lb 6.4 oz (99.1 kg)    There is no height or weight on file to calculate BMI.  Performance status (ECOG):   PHYSICAL EXAM:   GENERAL:alert, no distress and comfortable SKIN: skin color, texture, turgor are normal, no rashes or significant lesions EYES: normal, Conjunctiva are pink and non-injected, sclera clear OROPHARYNX:no exudate, no erythema and lips, buccal mucosa, and tongue normal  NECK: supple, thyroid normal size, non-tender, without nodularity LYMPH:  no palpable lymphadenopathy in the cervical, axillary or inguinal LUNGS: clear to auscultation and percussion with  normal breathing effort HEART: regular rate & rhythm and no murmurs and no lower extremity edema ABDOMEN:abdomen soft, non-tender and normal bowel sounds Musculoskeletal:no cyanosis of digits and no clubbing  NEURO: alert & oriented x 3 with fluent speech, no focal motor/sensory deficits  LABORATORY DATA:  I have reviewed the data as listed    Component Value Date/Time   NA 141 05/02/2024 1153   K 3.1 (L) 05/02/2024 1153   CL 104 05/02/2024 1153   CO2 34 (H) 05/02/2024 1153   GLUCOSE 130 (H) 05/02/2024 1153   BUN 11 05/02/2024 1153   CREATININE 1.16 05/02/2024 1153   CALCIUM  9.2 05/02/2024 1153   PROT 7.4 05/02/2024 1153   ALBUMIN 3.5 05/02/2024 1153   AST 22 05/02/2024 1153   ALT 20 05/02/2024 1153   ALKPHOS 70 05/02/2024 1153   BILITOT 1.0 05/02/2024 1153   GFRNONAA >60 05/02/2024 1153   GFRAA >60 05/22/2020 1752    No results found for: SPEP, UPEP  Lab Results  Component Value Date   WBC 8.8 05/02/2024   NEUTROABS 6.1  05/02/2024   HGB 11.1 (L) 05/02/2024   HCT 35.1 (L) 05/02/2024   MCV 85.0 05/02/2024   PLT 233 05/02/2024      Chemistry      Component Value Date/Time   NA 141 05/02/2024 1153   K 3.1 (L) 05/02/2024 1153   CL 104 05/02/2024 1153   CO2 34 (H) 05/02/2024 1153   BUN 11 05/02/2024 1153   CREATININE 1.16 05/02/2024 1153      Component Value Date/Time   CALCIUM  9.2 05/02/2024 1153   ALKPHOS 70 05/02/2024 1153   AST 22 05/02/2024 1153   ALT 20 05/02/2024 1153   BILITOT 1.0 05/02/2024 1153       RADIOGRAPHIC STUDIES: I have personally reviewed the radiological images as listed and agreed with the findings in the report. No results found.

## 2024-06-10 NOTE — Telephone Encounter (Signed)
 Opened by mistake.

## 2024-06-11 ENCOUNTER — Other Ambulatory Visit: Payer: Self-pay

## 2024-06-11 DIAGNOSIS — C182 Malignant neoplasm of ascending colon: Secondary | ICD-10-CM

## 2024-06-11 DIAGNOSIS — C19 Malignant neoplasm of rectosigmoid junction: Secondary | ICD-10-CM

## 2024-06-11 NOTE — Progress Notes (Signed)
 Verbal order w/readback order from Dr. Lanny for [employer] to be drawn on 06/27/2024 or next lab visit.  Order placed in [employer] and in [employer] portal.  [employer] kit and requisition given to [employer].

## 2024-06-27 ENCOUNTER — Encounter: Payer: Self-pay | Admitting: Hematology

## 2024-06-27 ENCOUNTER — Inpatient Hospital Stay: Attending: Nurse Practitioner

## 2024-06-27 ENCOUNTER — Inpatient Hospital Stay: Attending: Nurse Practitioner | Admitting: Hematology

## 2024-06-27 VITALS — BP 178/92 | HR 85 | Temp 98.2°F | Resp 16 | Ht 69.5 in | Wt 233.6 lb

## 2024-06-27 DIAGNOSIS — Z08 Encounter for follow-up examination after completed treatment for malignant neoplasm: Secondary | ICD-10-CM | POA: Insufficient documentation

## 2024-06-27 DIAGNOSIS — D500 Iron deficiency anemia secondary to blood loss (chronic): Secondary | ICD-10-CM

## 2024-06-27 DIAGNOSIS — C182 Malignant neoplasm of ascending colon: Secondary | ICD-10-CM

## 2024-06-27 DIAGNOSIS — Z9049 Acquired absence of other specified parts of digestive tract: Secondary | ICD-10-CM | POA: Insufficient documentation

## 2024-06-27 DIAGNOSIS — Z95828 Presence of other vascular implants and grafts: Secondary | ICD-10-CM

## 2024-06-27 DIAGNOSIS — D890 Polyclonal hypergammaglobulinemia: Secondary | ICD-10-CM | POA: Insufficient documentation

## 2024-06-27 DIAGNOSIS — Z85038 Personal history of other malignant neoplasm of large intestine: Secondary | ICD-10-CM | POA: Insufficient documentation

## 2024-06-27 DIAGNOSIS — Z9221 Personal history of antineoplastic chemotherapy: Secondary | ICD-10-CM | POA: Diagnosis not present

## 2024-06-27 DIAGNOSIS — D472 Monoclonal gammopathy: Secondary | ICD-10-CM

## 2024-06-27 DIAGNOSIS — I10 Essential (primary) hypertension: Secondary | ICD-10-CM | POA: Insufficient documentation

## 2024-06-27 DIAGNOSIS — M899 Disorder of bone, unspecified: Secondary | ICD-10-CM | POA: Insufficient documentation

## 2024-06-27 DIAGNOSIS — C19 Malignant neoplasm of rectosigmoid junction: Secondary | ICD-10-CM

## 2024-06-27 LAB — CBC WITH DIFFERENTIAL (CANCER CENTER ONLY)
Abs Immature Granulocytes: 0.03 K/uL (ref 0.00–0.07)
Basophils Absolute: 0.1 K/uL (ref 0.0–0.1)
Basophils Relative: 1 %
Eosinophils Absolute: 0.2 K/uL (ref 0.0–0.5)
Eosinophils Relative: 2 %
HCT: 38 % — ABNORMAL LOW (ref 39.0–52.0)
Hemoglobin: 12.1 g/dL — ABNORMAL LOW (ref 13.0–17.0)
Immature Granulocytes: 0 %
Lymphocytes Relative: 22 %
Lymphs Abs: 1.7 K/uL (ref 0.7–4.0)
MCH: 26.8 pg (ref 26.0–34.0)
MCHC: 31.8 g/dL (ref 30.0–36.0)
MCV: 84.3 fL (ref 80.0–100.0)
Monocytes Absolute: 0.8 K/uL (ref 0.1–1.0)
Monocytes Relative: 11 %
Neutro Abs: 4.9 K/uL (ref 1.7–7.7)
Neutrophils Relative %: 64 %
Platelet Count: 234 K/uL (ref 150–400)
RBC: 4.51 MIL/uL (ref 4.22–5.81)
RDW: 13.2 % (ref 11.5–15.5)
WBC Count: 7.7 K/uL (ref 4.0–10.5)
nRBC: 0 % (ref 0.0–0.2)

## 2024-06-27 LAB — CMP (CANCER CENTER ONLY)
ALT: 22 U/L (ref 0–44)
AST: 23 U/L (ref 15–41)
Albumin: 3.6 g/dL (ref 3.5–5.0)
Alkaline Phosphatase: 78 U/L (ref 38–126)
Anion gap: 4 — ABNORMAL LOW (ref 5–15)
BUN: 12 mg/dL (ref 8–23)
CO2: 33 mmol/L — ABNORMAL HIGH (ref 22–32)
Calcium: 9 mg/dL (ref 8.9–10.3)
Chloride: 102 mmol/L (ref 98–111)
Creatinine: 1.11 mg/dL (ref 0.61–1.24)
GFR, Estimated: >60 mL/min (ref >60)
Glucose, Bld: 173 mg/dL — ABNORMAL HIGH (ref 70–99)
Potassium: 3.3 mmol/L — ABNORMAL LOW (ref 3.5–5.1)
Sodium: 139 mmol/L (ref 135–145)
Total Bilirubin: 0.8 mg/dL (ref 0.0–1.2)
Total Protein: 7.9 g/dL (ref 6.5–8.1)

## 2024-06-27 LAB — MISCELLANEOUS TEST

## 2024-06-27 MED ORDER — SODIUM CHLORIDE 0.9% FLUSH
10.0000 mL | Freq: Once | INTRAVENOUS | Status: AC
  Administered 2024-06-27: 10 mL

## 2024-06-27 NOTE — Progress Notes (Signed)
 [facility]   Telephone:([phone] Fax:([phone]   Clinic Follow up Note   Patient Care Team: Leontine Cramp, NP as PCP - General (Nurse Practitioner) Lanny Callander, MD as Consulting Physician (Oncology)  Date of Service:  06/27/2024  CHIEF COMPLAINT: f/u of colon cancer  CURRENT THERAPY:  Cancer surveillance  Oncology History   Cancer of right colon (HCC) pT3N2aMx, at least stage IIIB, MSS -Colonoscopy performed on 10/13/21 for IDA and abdominal pain showed 4 cm mass near hepatic flexure. Path confirmed adenocarcinoma. -Baseline CEA was elevated at 25.9 on 10/13/21 -PET scan on 11/09/21 showed: hypermetabolism to colon mass and adjacent mildly enlarged ileocolonic mesenteric lymph nodes. Otherwise, no definitive evidence of additional spread. -partial colectomy on 11/17/21 with Dr. Debby showed 5.6 cm invasive moderately differentiated adenocarcinoma invading into pericolic adipose. Margins negative, but metastatic carcinoma in 6 lymph nodes (6/15). Two additional tubular adenomas without high-grade dysplasia noted. MMR normal and MSI stable.  -given his multiple (6) positive lymph nodes, he completed 12 cycles adjuvant chemotherapy with FOLFOX 12/23/21 - 06/23/22. He tolerated well overall with mild side effects. 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 -Surveillance CT scan from June 15, 2023 showed no definitive evidence of cancer recurrence, there is small nonspecific bone lesions especially in the pelvic bone, stable from previous PET scan and not FDG avid, SPEP showed polyclonal increase in multiple immunoglobin -He is clinically doing well, no clinical suspicion for recurrence.  Will continue surveillance.   Assessment & Plan Malignant neoplasm of ascending colon, in surveillance Currently under surveillance for colon cancer. Last colonoscopy was in May 2024, with the next one due in two years. Blood counts are stable with hemoglobin at 12.1, and CEA  tumor marker is normal. Previous CT scan showed bone lesions, but blood tests for multiple myeloma were not indicative of malignancy, suggesting benign bone lesions. - Order CT scan within the next 2-3 weeks to confirm colon cancer status and determine if port removal is appropriate. - Schedule port removal after CT scan results are clear.  Benign bone lesions Previous CT scan showed bone lesions. Blood tests indicated polyclonal gammopathy, suggesting benign bone lesions rather than a malignant process. - No further testing for multiple myeloma needed unless new symptoms arise.   Essential hypertension Ran out of Levetirol for two days. - Resume Levetirol as soon as possible. - Contact primary care physician for medication refill if needed.  Plan - He is clinically doing well, lab reviewed. - He is due for surveillance CT scan, I ordered today, will get it done in the next 2 to 3 weeks. - If CT scan negative, I will message his surgeon Dr. Debby for port removal - Lab and follow-up in 6 months - Patient had ctDNA Signatera today   SUMMARY OF ONCOLOGIC HISTORY: Oncology History Overview Note   Cancer Staging  Colorectal cancer [facility]) Staging form: Colon and Rectum, AJCC 8th Edition - Pathologic stage from 11/17/2021: Stage IIIB (pT3, pN2a, cM0) - Signed by Lanny Callander, MD on 12/03/2021    Cancer of right colon [facility])  10/13/2021 Procedure   Upper GI endoscopy by Dr. Teressa for IDA and abdominal pain  Findings:  - The esophagus was normal. - The stomach was normal. - The examined duodenum was normal.   10/13/2021 Procedure   Colonoscopy Indications: Iron deficiency  - A fungating and ulcerated partially obstructing, clearly malignant mass was found at the hepatic flexure. The mass was circumferential and measured 4cm in  length. Biopsies were taken with a cold forceps for histology and then the mucosa just distal to the mass was tattooed with an injection of Spot (carbon  black). jar 1 Findings: - Two pedunculated polyps were found in the mid transverse colon. The polyps were 8 to 11 mm in size. These polyps were removed with a hot snare. Resection and retrieval were complete. jar 2 - Five pedunculated and sessile polyps were found in the distal transverse colon. The polyps were 4 to 14 mm in size. Two were removed with hot snare, three were removed with cold snare. Resection and retrieval were complete. jar 3 - A 20 mm polyp was found in the distal transverse colon. The polyp was pedunculated. The polyp was removed with a hot snare. Resection and retrieval were complete. jar 4 - The exam was otherwise without abnormality on direct and retroflexion views.   10/13/2021 Pathology Results   REPORT OF SURGICAL PATHOLOGY Accession: [id number]  Diagnosis 1. Hepatic Flexure Biopsy - ADENOCARCINOMA, SEE NOTE   10/21/2021 Imaging   CT CHEST ABDOMEN PELVIS W CONTRAST  IMPRESSION: 1. There is a circumferential mass of the mid ascending colon measuring approximately 5 cm in length, in keeping with newly diagnosed primary colon malignancy. 2. Adjacent to the colon mass, there is mesenteric fat stranding suggesting invasion beyond the muscularis as well as abnormally enlarged mesocolonic lymph nodes. 3. Mildly prominent, asymmetric right external iliac lymph nodes, nonspecific although suspicious for early nodal metastatic disease. 4. Asymmetrically prominent left axillary lymph nodes, nonspecific, these nodes less favored to reflect metastatic disease given location in the absence of other evidence of metastatic disease above the diaphragm. Both external iliac and axillary lymph nodes could be characterized for abnormal metabolic activity by PET-CT. 5. No evidence of organ metastatic disease in the chest, abdomen, or pelvis. 6. Exophytic soft tissue attenuation lesion of the anterior midportion of the left kidney measuring 1.2 cm. Partially exophytic soft  tissue attenuation lesion of the posteroinferior pole of the left kidney measuring 1.9 x 1.7 cm. These may reflect hemorrhagic or proteinaceous cysts but are incompletely characterized and incidental renal cell carcinoma is not excluded. Recommend initial renal ultrasound to establish definitively cystic character, multiphasic contrast enhanced CT or MRI may however be further required to exclude malignancy. 7. Prostatomegaly with thickening of the decompressed urinary bladder wall, likely due to chronic outlet obstruction.   Aortic Atherosclerosis (ICD10-I70.0).   10/28/2021 Initial Diagnosis   Colorectal cancer (HCC)   11/17/2021 Cancer Staging   Staging form: Colon and Rectum, AJCC 8th Edition - Pathologic stage from 11/17/2021: Stage IIIB (pT3, pN2a, cM0) - Signed by Lanny Callander, MD on 12/03/2021 Histologic grading system: 4 grade system Histologic grade (G): G2 Residual tumor (R): R0 - None   11/17/2021 Definitive Surgery   FINAL MICROSCOPIC DIAGNOSIS:   A. COLON, RIGHT, RESECTION:  Invasive moderately differentiated adenocarcinoma invading into  pericolic adipose (pT3)  Tumor measures 5.2 x 3.6 x 0.8 cm  Margins free of carcinoma and adenomatous change  Six of fifteen pericolic lymph nodes with metastatic carcinoma (6/15, pN2a)  Two additional tubular adenomas without high-grade dysplasia, largest measuring 1.3 cm  Benign appendix   ADDENDUM:  Mismatch Repair Protein (IHC)  SUMMARY INTERPRETATION: NORMAL  Microsatellite Instability (MSI) Result: STABLE   12/23/2021 -  Chemotherapy   Patient is on Treatment Plan : COLORECTAL FOLFOX q14d x 6 months     11/25/2022 Imaging    IMPRESSION: 1. No definitive findings to suggest metastatic disease in  the chest, abdomen or pelvis. Prominent borderline enlarged lymph nodes in the portacaval and hepatoduodenal ligament, stable compared to the prior examination, nonspecific. Continued attention on follow-up studies is recommended. 2.  Multiple prominent borderline enlarged left axillary lymph nodes, stable compared to prior studies, nonspecific but favored to be benign given their stability in size. 3. Colonic diverticulosis without evidence of acute diverticulitis at this time. 4. Aortic atherosclerosis. 5. Additional incidental findings, as above.      Discussed the use of AI scribe software for clinical note transcription with the patient, who gave verbal consent to proceed.  History of Present Illness George Watkins is a 73 year old male with colon cancer who presents for follow-up.  He experiences occasional stomachaches approximately once every eight days, with normal bowel movements and no recent changes in gastrointestinal symptoms. He inquires about the effects of gallbladder removal on digestion, noting occasional constipation and bloating post-surgery.  He recalls a previous CT scan showing bone lesions, with subsequent blood tests conducted to rule out multiple myeloma. He has a past allergic reaction to a contrast agent used in imaging, causing itching, but he tolerates the contrast in subsequent procedures.  He feels cold at times, both at home and during the visit, but denies fever. He humorously attributes these symptoms to 'menopause'.     All other systems were reviewed with the patient and are negative.  MEDICAL HISTORY:  Past Medical History:  Diagnosis Date   Anemia    Arthritis    Colon cancer (HCC)    Coronary artery disease    GERD (gastroesophageal reflux disease)    Heart murmur    Hiatal hernia    Hypertension    Pneumonia    Sleep apnea     SURGICAL HISTORY: Past Surgical History:  Procedure Laterality Date   ANKLE SURGERY     CIRCUMCISION     COLONOSCOPY     HERNIA REPAIR     PORTACATH PLACEMENT N/A 12/09/2021   Procedure: INSERTION PORT-A-CATH with ultrasound;  Surgeon: Debby Hila, MD;  Location: [facility];  Service: General;  Laterality: N/A;    I have reviewed the  social history and family history with the patient and they are unchanged from previous note.  ALLERGIES:  is allergic to iodinated contrast media and sulfa antibiotics.  MEDICATIONS:  Current Outpatient Medications  Medication Sig Dispense Refill   amLODipine  (NORVASC ) 5 MG tablet Take 5 mg by mouth daily.     atorvastatin (LIPITOR) 10 MG tablet Take 10 mg by mouth at bedtime.     cholecalciferol (VITAMIN D3) 25 MCG (1000 UNIT) tablet Take 1,000 Units by mouth daily.     cloNIDine  (CATAPRES ) 0.2 MG tablet Take 0.2 mg by mouth 2 (two) times daily.     cyclobenzaprine  (FLEXERIL ) 5 MG tablet Take 5 mg by mouth at bedtime as needed for muscle spasms.     ferrous sulfate  325 (65 FE) MG tablet Take 1 tablet (325 mg total) by mouth 2 (two) times daily with a meal. 30 tablet 0   hydrochlorothiazide  (HYDRODIURIL ) 12.5 MG tablet Take 1 tablet by mouth daily.     ketoconazole  (NIZORAL ) 2 % cream Apply 1 Application topically 2 (two) times daily. 60 g 2   KLOR-CON  M10 10 MEQ tablet TAKE 1 TABLET BY MOUTH TWICE A DAY 180 tablet 1   labetalol  (NORMODYNE ) 200 MG tablet Take 200 mg by mouth 2 (two) times daily.     lidocaine -prilocaine  (EMLA ) cream Apply to affected  area once 30 g 3   losartan  (COZAAR ) 100 MG tablet Take 0.5 tablets by mouth daily.     losartan -hydrochlorothiazide  (HYZAAR) 100-25 MG tablet Take 1 tablet by mouth daily.     Multiple Vitamins-Minerals (MULTIVITAMIN MEN 50+ PO) Take 1 tablet by mouth daily.     diphenhydrAMINE  (BENADRYL ) 50 MG tablet Take 1 tablet (50 mg total) by mouth at bedtime as needed for itching. Take 1 tablet by mouth 1 hour prior to scan (Patient not taking: Reported on 06/27/2024) 1 tablet 0   predniSONE  (DELTASONE ) 50 MG tablet Take 1 tablet 13 hour, 7 hours, and 1 hour before the scan (Patient not taking: Reported on 06/27/2024) 3 tablet 0   No current facility-administered medications for this visit.    PHYSICAL EXAMINATION: ECOG PERFORMANCE STATUS: 0 -  Asymptomatic  Vitals:   06/27/24 1100 06/27/24 1133  BP: (!) 180/90 (!) 178/92  Pulse: 85   Resp: 16   Temp: 98.2 F (36.8 C)   SpO2: 99%    Wt Readings from Last 3 Encounters:  06/27/24 233 lb 9.6 oz (106 kg)  11/16/23 244 lb 12.8 oz (111 kg)  06/22/23 230 lb 8 oz (104.6 kg)     GENERAL:alert, no distress and comfortable SKIN: skin color, texture, turgor are normal, no rashes or significant lesions EYES: normal, Conjunctiva are pink and non-injected, sclera clear NECK: supple, thyroid normal size, non-tender, without nodularity LYMPH:  no palpable lymphadenopathy in the cervical, axillary  LUNGS: clear to auscultation and percussion with normal breathing effort HEART: regular rate & rhythm and no murmurs and no lower extremity edema ABDOMEN:abdomen soft, non-tender and normal bowel sounds Musculoskeletal:no cyanosis of digits and no clubbing  NEURO: alert & oriented x 3 with fluent speech, no focal motor/sensory deficits  Physical Exam CARDIOVASCULAR: Heart sounds normal. EXTREMITIES: No edema in extremities.  LABORATORY DATA:  I have reviewed the data as listed    Latest Ref Rng & Units 06/27/2024   10:50 AM 05/02/2024   11:53 AM 03/07/2024   11:13 AM  CBC  WBC 4.0 - 10.5 K/uL 7.7  8.8  8.6   Hemoglobin 13.0 - 17.0 g/dL 87.8  88.8  89.0   Hematocrit 39.0 - 52.0 % 38.0  35.1  34.8   Platelets 150 - 400 K/uL 234  233  189         Latest Ref Rng & Units 05/02/2024   11:53 AM 03/07/2024   11:13 AM 11/16/2023   11:19 AM  CMP  Glucose 70 - 99 mg/dL 869  887  747   BUN 8 - 23 mg/dL 11  10  13    Creatinine 0.61 - 1.24 mg/dL 8.83  8.82  8.74   Sodium 135 - 145 mmol/L 141  141  136   Potassium 3.5 - 5.1 mmol/L 3.1  3.3  3.5   Chloride 98 - 111 mmol/L 104  105  99   CO2 22 - 32 mmol/L 34  33  30   Calcium  8.9 - 10.3 mg/dL 9.2  8.6  8.8   Total Protein 6.5 - 8.1 g/dL 7.4  6.9  7.0   Total Bilirubin 0.0 - 1.2 mg/dL 1.0  1.0  1.0   Alkaline Phos 38 - 126 U/L 70  93  81    AST 15 - 41 U/L 22  27  16    ALT 0 - 44 U/L 20  29  14        RADIOGRAPHIC STUDIES: I have personally  reviewed the radiological images as listed and agreed with the findings in the report. No results found.    Orders Placed This Encounter  Procedures   CT CHEST ABDOMEN PELVIS W CONTRAST    Standing Status:   Future    Expected Date:   07/11/2024    Expiration Date:   06/27/2025    If indicated for the ordered procedure, I authorize the administration of contrast media per Radiology protocol:   Yes    Does the patient have a contrast media/X-ray dye allergy?:   No    Preferred imaging location?:   [facility]    If indicated for the ordered procedure, I authorize the administration of oral contrast media per Radiology protocol:   Yes   IR REMOVAL TUN ACCESS W/ PORT W/O FL MOD SED    Standing Status:   Future    Expiration Date:   06/27/2025    Reason for Exam (SYMPTOM  OR DIAGNOSIS REQUIRED):   no needed anymore    Preferred Imaging Location?:   [facility]   Miscellaneous test (send-out)   All questions were answered. The patient knows to call the clinic with any problems, questions or concerns. No barriers to learning was detected. The total time spent in the appointment was 25 minutes, including review of chart and various tests results, discussions about plan of care and coordination of care plan     Onita Mattock, MD 06/27/2024

## 2024-06-27 NOTE — Assessment & Plan Note (Signed)
 pT3N2aMx, at least stage IIIB, MSS -Colonoscopy performed on 10/13/21 for IDA and abdominal pain showed 4 cm mass near hepatic flexure. Path confirmed adenocarcinoma. -Baseline CEA was elevated at 25.9 on 10/13/21 -PET scan on 11/09/21 showed: hypermetabolism to colon mass and adjacent mildly enlarged ileocolonic mesenteric lymph nodes. Otherwise, no definitive evidence of additional spread. -partial colectomy on 11/17/21 with Dr. Debby showed 5.6 cm invasive moderately differentiated adenocarcinoma invading into pericolic adipose. Margins negative, but metastatic carcinoma in 6 lymph nodes (6/15). Two additional tubular adenomas without high-grade dysplasia noted. MMR normal and MSI stable.  -given his multiple (6) positive lymph nodes, he completed 12 cycles adjuvant chemotherapy with FOLFOX 12/23/21 - 06/23/22. He tolerated well overall with mild side effects. 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 -Surveillance CT scan from June 15, 2023 showed no definitive evidence of cancer recurrence, there is small nonspecific bone lesions especially in the pelvic bone, stable from previous PET scan and not FDG avid, SPEP showed polyclonal increase in multiple immunoglobin -He is clinically doing well, no clinical suspicion for recurrence.  Will continue surveillance.

## 2024-06-28 ENCOUNTER — Other Ambulatory Visit: Payer: Self-pay

## 2024-06-29 ENCOUNTER — Other Ambulatory Visit: Payer: Self-pay

## 2024-07-10 ENCOUNTER — Encounter (HOSPITAL_COMMUNITY): Payer: Self-pay

## 2024-07-10 ENCOUNTER — Other Ambulatory Visit: Payer: Self-pay

## 2024-07-10 ENCOUNTER — Ambulatory Visit (HOSPITAL_COMMUNITY)
Admission: RE | Admit: 2024-07-10 | Discharge: 2024-07-10 | Disposition: A | Discharge status: Home / Self Care | Source: Ambulatory Visit | Attending: Hematology | Admitting: Hematology

## 2024-07-10 DIAGNOSIS — C182 Malignant neoplasm of ascending colon: Secondary | ICD-10-CM | POA: Insufficient documentation

## 2024-07-10 MED ORDER — BARIUM SULFATE 2 % PO SUSP
900.0000 mL | Freq: Once | ORAL | Status: AC
  Administered 2024-07-10: 900 mL via ORAL

## 2024-07-11 ENCOUNTER — Other Ambulatory Visit: Payer: Self-pay

## 2024-07-12 ENCOUNTER — Telehealth: Payer: Self-pay

## 2024-07-12 ENCOUNTER — Other Ambulatory Visit: Payer: Self-pay

## 2024-07-12 MED ORDER — PREDNISONE 50 MG PO TABS: ORAL_TABLET | ORAL | 1 refills | Status: AC

## 2024-07-12 NOTE — Telephone Encounter (Signed)
 Received telephone call from the patient requesting Prednisone  Rx be resent to the [facility]. Contacted [facility] to verify.  Resent Prednisone  Rx. Let patient know Rx was sent. Let patient know that the Benadryl  50mg  tablets has to be purchased otc, per [facility]. Patient voiced understanding.

## 2024-07-15 ENCOUNTER — Encounter: Payer: Self-pay | Admitting: Hematology

## 2024-07-17 LAB — SIGNATERA
SIGNATERA MTM READOUT: 0 MTM/ml
SIGNATERA TEST RESULT: NEGATIVE

## 2024-07-18 ENCOUNTER — Encounter (HOSPITAL_COMMUNITY): Payer: Self-pay | Admitting: Hematology

## 2024-07-19 ENCOUNTER — Other Ambulatory Visit: Payer: Self-pay

## 2024-07-19 ENCOUNTER — Ambulatory Visit (HOSPITAL_COMMUNITY)
Admission: RE | Admit: 2024-07-19 | Discharge: 2024-07-19 | Disposition: A | Discharge status: Home / Self Care | Source: Ambulatory Visit | Attending: Hematology | Admitting: Hematology

## 2024-07-19 DIAGNOSIS — C182 Malignant neoplasm of ascending colon: Secondary | ICD-10-CM | POA: Diagnosis present

## 2024-07-19 MED ORDER — IOHEXOL 300 MG/ML  SOLN
100.0000 mL | Freq: Once | INTRAMUSCULAR | Status: AC | PRN
Start: 2024-07-19 — End: 2024-07-19
  Administered 2024-07-19: 100 mL via INTRAVENOUS

## 2024-07-19 MED ORDER — HEPARIN SOD (PORK) LOCK FLUSH 100 UNIT/ML IV SOLN
500.0000 [IU] | Freq: Once | INTRAVENOUS | Status: AC
  Administered 2024-07-19: 500 [IU] via INTRAVENOUS

## 2024-07-22 ENCOUNTER — Telehealth: Payer: Self-pay

## 2024-07-22 NOTE — Telephone Encounter (Signed)
 Spoke with pt via telephone to schedule a telephone visit to speak with Dr. Lanny to go over the pt's CT Scan results.  Pt scheduled to see Dr. Lanny on 07/25/2024 at 4pm.  Pt confirmed the appt date and time.

## 2024-07-25 ENCOUNTER — Inpatient Hospital Stay: Attending: Nurse Practitioner | Admitting: Hematology

## 2024-07-25 DIAGNOSIS — Z9221 Personal history of antineoplastic chemotherapy: Secondary | ICD-10-CM | POA: Insufficient documentation

## 2024-07-25 DIAGNOSIS — Z9049 Acquired absence of other specified parts of digestive tract: Secondary | ICD-10-CM | POA: Insufficient documentation

## 2024-07-25 DIAGNOSIS — Z85038 Personal history of other malignant neoplasm of large intestine: Secondary | ICD-10-CM | POA: Diagnosis present

## 2024-07-25 DIAGNOSIS — N2889 Other specified disorders of kidney and ureter: Secondary | ICD-10-CM | POA: Diagnosis not present

## 2024-07-25 DIAGNOSIS — R109 Unspecified abdominal pain: Secondary | ICD-10-CM | POA: Diagnosis not present

## 2024-07-25 DIAGNOSIS — C182 Malignant neoplasm of ascending colon: Secondary | ICD-10-CM

## 2024-07-25 NOTE — Assessment & Plan Note (Signed)
 pT3N2aMx, at least stage IIIB, MSS -Colonoscopy performed on 10/13/21 for IDA and abdominal pain showed 4 cm mass near hepatic flexure. Path confirmed adenocarcinoma. -Baseline CEA was elevated at 25.9 on 10/13/21 -PET scan on 11/09/21 showed: hypermetabolism to colon mass and adjacent mildly enlarged ileocolonic mesenteric lymph nodes. Otherwise, no definitive evidence of additional spread. -partial colectomy on 11/17/21 with Dr. Debby showed 5.6 cm invasive moderately differentiated adenocarcinoma invading into pericolic adipose. Margins negative, but metastatic carcinoma in 6 lymph nodes (6/15). Two additional tubular adenomas without high-grade dysplasia noted. MMR normal and MSI stable.  -given his multiple (6) positive lymph nodes, he completed 12 cycles adjuvant chemotherapy with FOLFOX 12/23/21 - 06/23/22. He tolerated well overall with mild side effects. 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 -Surveillance CT scan from June 15, 2023 showed no definitive evidence of cancer recurrence, there is small nonspecific bone lesions especially in the pelvic bone, stable from previous PET scan and not FDG avid, SPEP showed polyclonal increase in multiple immunoglobin -He is clinically doing well, no clinical suspicion for recurrence.  Will continue surveillance.

## 2024-07-26 ENCOUNTER — Ambulatory Visit: Admitting: Podiatry

## 2024-07-27 ENCOUNTER — Encounter: Payer: Self-pay | Admitting: Hematology

## 2024-07-27 NOTE — Progress Notes (Signed)
 [facility]   [phone] [phone]   Clinic Follow up Note   Patient Care Team: Leontine Cramp, NP as PCP - General (Nurse Practitioner) Lanny Callander, MD as Consulting Physician (Oncology) 07/25/2024  I connected with George Watkins on 07/27/24 at  4:00 PM EDT by telephone and verified that I am speaking with the correct person using two identifiers.   I discussed the limitations, risks, security and privacy concerns of performing an evaluation and management service by telephone and the availability of in person appointments. I also discussed with the patient that there may be a patient responsible charge related to this service. The patient expressed understanding and agreed to proceed.   Patient's location:  Home  Provider's location:  Office    CHIEF COMPLAINT: review CT results    CURRENT THERAPY: cancer surveillance   Oncology history Cancer of right colon (HCC) pT3N2aMx, at least stage IIIB, MSS -Colonoscopy performed on 10/13/21 for IDA and abdominal pain showed 4 cm mass near hepatic flexure. Path confirmed adenocarcinoma. -Baseline CEA was elevated at 25.9 on 10/13/21 -PET scan on 11/09/21 showed: hypermetabolism to colon mass and adjacent mildly enlarged ileocolonic mesenteric lymph nodes. Otherwise, no definitive evidence of additional spread. -partial colectomy on 11/17/21 with Dr. Debby showed 5.6 cm invasive moderately differentiated adenocarcinoma invading into pericolic adipose. Margins negative, but metastatic carcinoma in 6 lymph nodes (6/15). Two additional tubular adenomas without high-grade dysplasia noted. MMR normal and MSI stable.  -given his multiple (6) positive lymph nodes, he completed 12 cycles adjuvant chemotherapy with FOLFOX 12/23/21 - 06/23/22. He tolerated well overall with mild side effects. 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 -Surveillance CT scan from June 15, 2023 showed no definitive evidence of  cancer recurrence, there is small nonspecific bone lesions especially in the pelvic bone, stable from previous PET scan and not FDG avid, SPEP showed polyclonal increase in multiple immunoglobin -He is clinically doing well, no clinical suspicion for recurrence.  Will continue surveillance.   Assessment & Plan Right-sided colon cancer status post resection  -on surveillance for recurrence -recent surveillance CT scan shows slightly enlarged pelvic lymph nodes, largest measuring 1.5 cm on the right side, comparing to previous imaging. No significant change in size over the last few scans. PET scan from February 2024 was negative for malignancy. Recent Signatera test was negative. Given the stability and negative tests, the risk of recurrence is low. However, due to slight enlargement, further evaluation with a PET scan is warranted to rule out recurrence.  - Order PET scan within the next 2-3 weeks to evaluate pelvic lymph nodes. - If PET scan is negative, plan for port removal. - If PET scan is negative, schedule follow-up scan in 9-12 months.  Intermittent mild abdominal pain Intermittent mild abdominal pain localized around the navel, sometimes related to dietary intake. Not present daily and not associated with findings on recent imaging. Unlikely related to cancer recurrence. - Monitor symptoms and dietary triggers.  Left renal lesion under surveillance Left renal lesion measuring 2 cm has been stable since 2022 with no significant change in size. Likely benign given stability over three years. - Continue surveillance of left renal lesion.  Plan -I personally reviewed his recent CT scan images and discussed with pt about the findings -will obtain PET in next few weeks to further evaluate his pelvic adenopathy. If negative, will proceed with port removal by his surgeon Dr. Debby -lab and f/u in 6 months as scheduled  SUMMARY OF ONCOLOGIC HISTORY: Oncology History Overview Note    Cancer Staging  Colorectal cancer [facility]) Staging form: Colon and Rectum, AJCC 8th Edition - Pathologic stage from 11/17/2021: Stage IIIB (pT3, pN2a, cM0) - Signed by Lanny Callander, MD on 12/03/2021    Cancer of right colon [facility])  10/13/2021 Procedure   Upper GI endoscopy by Dr. Teressa for IDA and abdominal pain  Findings:  - The esophagus was normal. - The stomach was normal. - The examined duodenum was normal.   10/13/2021 Procedure   Colonoscopy Indications: Iron deficiency  - A fungating and ulcerated partially obstructing, clearly malignant mass was found at the hepatic flexure. The mass was circumferential and measured 4cm in length. Biopsies were taken with a cold forceps for histology and then the mucosa just distal to the mass was tattooed with an injection of Spot (carbon black). jar 1 Findings: - Two pedunculated polyps were found in the mid transverse colon. The polyps were 8 to 11 mm in size. These polyps were removed with a hot snare. Resection and retrieval were complete. jar 2 - Five pedunculated and sessile polyps were found in the distal transverse colon. The polyps were 4 to 14 mm in size. Two were removed with hot snare, three were removed with cold snare. Resection and retrieval were complete. jar 3 - A 20 mm polyp was found in the distal transverse colon. The polyp was pedunculated. The polyp was removed with a hot snare. Resection and retrieval were complete. jar 4 - The exam was otherwise without abnormality on direct and retroflexion views.   10/13/2021 Pathology Results   REPORT OF SURGICAL PATHOLOGY Accession: [id number]  Diagnosis 1. Hepatic Flexure Biopsy - ADENOCARCINOMA, SEE NOTE   10/21/2021 Imaging   CT CHEST ABDOMEN PELVIS W CONTRAST  IMPRESSION: 1. There is a circumferential mass of the mid ascending colon measuring approximately 5 cm in length, in keeping with newly diagnosed primary colon malignancy. 2. Adjacent to the colon mass, there is  mesenteric fat stranding suggesting invasion beyond the muscularis as well as abnormally enlarged mesocolonic lymph nodes. 3. Mildly prominent, asymmetric right external iliac lymph nodes, nonspecific although suspicious for early nodal metastatic disease. 4. Asymmetrically prominent left axillary lymph nodes, nonspecific, these nodes less favored to reflect metastatic disease given location in the absence of other evidence of metastatic disease above the diaphragm. Both external iliac and axillary lymph nodes could be characterized for abnormal metabolic activity by PET-CT. 5. No evidence of organ metastatic disease in the chest, abdomen, or pelvis. 6. Exophytic soft tissue attenuation lesion of the anterior midportion of the left kidney measuring 1.2 cm. Partially exophytic soft tissue attenuation lesion of the posteroinferior pole of the left kidney measuring 1.9 x 1.7 cm. These may reflect hemorrhagic or proteinaceous cysts but are incompletely characterized and incidental renal cell carcinoma is not excluded. Recommend initial renal ultrasound to establish definitively cystic character, multiphasic contrast enhanced CT or MRI may however be further required to exclude malignancy. 7. Prostatomegaly with thickening of the decompressed urinary bladder wall, likely due to chronic outlet obstruction.   Aortic Atherosclerosis (ICD10-I70.0).   10/28/2021 Initial Diagnosis   Colorectal cancer (HCC)   11/17/2021 Cancer Staging   Staging form: Colon and Rectum, AJCC 8th Edition - Pathologic stage from 11/17/2021: Stage IIIB (pT3, pN2a, cM0) - Signed by Lanny Callander, MD on 12/03/2021 Histologic grading system: 4 grade system Histologic grade (G): G2 Residual tumor (R): R0 - None   11/17/2021 Definitive Surgery   FINAL  MICROSCOPIC DIAGNOSIS:   A. COLON, RIGHT, RESECTION:  Invasive moderately differentiated adenocarcinoma invading into  pericolic adipose (pT3)  Tumor measures 5.2 x 3.6 x 0.8  cm  Margins free of carcinoma and adenomatous change  Six of fifteen pericolic lymph nodes with metastatic carcinoma (6/15, pN2a)  Two additional tubular adenomas without high-grade dysplasia, largest measuring 1.3 cm  Benign appendix   ADDENDUM:  Mismatch Repair Protein (IHC)  SUMMARY INTERPRETATION: NORMAL  Microsatellite Instability (MSI) Result: STABLE   12/23/2021 - 06/25/2022 Chemotherapy   Patient is on Treatment Plan : COLORECTAL FOLFOX q14d x 6 months     11/25/2022 Imaging    IMPRESSION: 1. No definitive findings to suggest metastatic disease in the chest, abdomen or pelvis. Prominent borderline enlarged lymph nodes in the portacaval and hepatoduodenal ligament, stable compared to the prior examination, nonspecific. Continued attention on follow-up studies is recommended. 2. Multiple prominent borderline enlarged left axillary lymph nodes, stable compared to prior studies, nonspecific but favored to be benign given their stability in size. 3. Colonic diverticulosis without evidence of acute diverticulitis at this time. 4. Aortic atherosclerosis. 5. Additional incidental findings, as above.     Discussed the use of AI scribe software for clinical note transcription with the patient, who gave verbal consent to proceed.  History of Present Illness George Watkins is a 73 year old male with colon cancer who presents for a follow-up to review a CT scan report.  The CT scan from July 19, 2024, shows no definitive evidence of cancer recurrence. A stable lesion in the left kidney, approximately two centimeters, has shown no significant change since 2022. Lymph nodes in the pelvic area are slightly enlarged, with the largest being 1.5 centimeters on the right side, showing minimal enlargement over the last few scans. A PET scan in February 2024 was negative for lymph nodes in the abdomen and pelvis. He experiences occasional abdominal pain around or below the navel, sometimes  related to food intake, but not daily.     REVIEW OF SYSTEMS:   Constitutional: Denies fevers, chills or abnormal weight loss Eyes: Denies blurriness of vision Ears, nose, mouth, throat, and face: Denies mucositis or sore throat Respiratory: Denies cough, dyspnea or wheezes Cardiovascular: Denies palpitation, chest discomfort or lower extremity swelling Gastrointestinal:  Denies nausea, heartburn or change in bowel habits Skin: Denies abnormal skin rashes Lymphatics: Denies new lymphadenopathy or easy bruising Neurological:Denies numbness, tingling or new weaknesses Behavioral/Psych: Mood is stable, no new changes  All other systems were reviewed with the patient and are negative.  MEDICAL HISTORY:  Past Medical History:  Diagnosis Date   Anemia    Arthritis    Colon cancer (HCC)    Coronary artery disease    GERD (gastroesophageal reflux disease)    Heart murmur    Hiatal hernia    Hypertension    Pneumonia    Sleep apnea     SURGICAL HISTORY: Past Surgical History:  Procedure Laterality Date   ANKLE SURGERY     CIRCUMCISION     COLONOSCOPY     HERNIA REPAIR     PORTACATH PLACEMENT N/A 12/09/2021   Procedure: INSERTION PORT-A-CATH with ultrasound;  Surgeon: Debby Hila, MD;  Location: [facility];  Service: General;  Laterality: N/A;    I have reviewed the social history and family history with the patient and they are unchanged from previous note.  ALLERGIES:  is allergic to iodinated contrast media and sulfa antibiotics.  MEDICATIONS:  Current Outpatient Medications  Medication Sig Dispense Refill   amLODipine  (NORVASC ) 5 MG tablet Take 5 mg by mouth daily.     atorvastatin (LIPITOR) 10 MG tablet Take 10 mg by mouth at bedtime.     cholecalciferol (VITAMIN D3) 25 MCG (1000 UNIT) tablet Take 1,000 Units by mouth daily.     cloNIDine  (CATAPRES ) 0.2 MG tablet Take 0.2 mg by mouth 2 (two) times daily.     cyclobenzaprine  (FLEXERIL ) 5 MG tablet Take 5 mg by mouth at  bedtime as needed for muscle spasms.     diphenhydrAMINE  (BENADRYL ) 50 MG tablet Take 1 tablet (50 mg total) by mouth at bedtime as needed for itching. Take 1 tablet by mouth 1 hour prior to scan (Patient not taking: Reported on 06/27/2024) 1 tablet 0   ferrous sulfate  325 (65 FE) MG tablet Take 1 tablet (325 mg total) by mouth 2 (two) times daily with a meal. 30 tablet 0   hydrochlorothiazide  (HYDRODIURIL ) 12.5 MG tablet Take 1 tablet by mouth daily.     ketoconazole  (NIZORAL ) 2 % cream Apply 1 Application topically 2 (two) times daily. 60 g 2   KLOR-CON  M10 10 MEQ tablet TAKE 1 TABLET BY MOUTH TWICE A DAY 180 tablet 1   labetalol  (NORMODYNE ) 200 MG tablet Take 200 mg by mouth 2 (two) times daily.     losartan  (COZAAR ) 100 MG tablet Take 0.5 tablets by mouth daily.     losartan -hydrochlorothiazide  (HYZAAR) 100-25 MG tablet Take 1 tablet by mouth daily.     Multiple Vitamins-Minerals (MULTIVITAMIN MEN 50+ PO) Take 1 tablet by mouth daily.     predniSONE  (DELTASONE ) 50 MG tablet Take 1 tablet 13 hour, 7 hours, and 1 hour before the scan 3 tablet 1   No current facility-administered medications for this visit.    PHYSICAL EXAMINATION: Not performed   LABORATORY DATA:  I have reviewed the data as listed    Latest Ref Rng & Units 06/27/2024   10:50 AM 05/02/2024   11:53 AM 03/07/2024   11:13 AM  CBC  WBC 4.0 - 10.5 K/uL 7.7  8.8  8.6   Hemoglobin 13.0 - 17.0 g/dL 87.8  88.8  89.0   Hematocrit 39.0 - 52.0 % 38.0  35.1  34.8   Platelets 150 - 400 K/uL 234  233  189         Latest Ref Rng & Units 06/27/2024   10:50 AM 05/02/2024   11:53 AM 03/07/2024   11:13 AM  CMP  Glucose 70 - 99 mg/dL 826  869  887   BUN 8 - 23 mg/dL 12  11  10    Creatinine 0.61 - 1.24 mg/dL 8.88  8.83  8.82   Sodium 135 - 145 mmol/L 139  141  141   Potassium 3.5 - 5.1 mmol/L 3.3  3.1  3.3   Chloride 98 - 111 mmol/L 102  104  105   CO2 22 - 32 mmol/L 33  34  33   Calcium  8.9 - 10.3 mg/dL 9.0  9.2  8.6   Total  Protein 6.5 - 8.1 g/dL 7.9  7.4  6.9   Total Bilirubin 0.0 - 1.2 mg/dL 0.8  1.0  1.0   Alkaline Phos 38 - 126 U/L 78  70  93   AST 15 - 41 U/L 23  22  27    ALT 0 - 44 U/L 22  20  29        RADIOGRAPHIC STUDIES: I have personally reviewed the  radiological images as listed and agreed with the findings in the report. No results found.     I discussed the assessment and treatment plan with the patient. The patient was provided an opportunity to ask questions and all were answered. The patient agreed with the plan and demonstrated an understanding of the instructions.   The patient was advised to call back or seek an in-person evaluation if the symptoms worsen or if the condition fails to improve as anticipated.  I provided 25 minutes of non face-to-face telephone visit time during this encounter, including review of chart and various tests results, discussions about plan of care and coordination of care plan.    Onita Mattock, MD 07/25/2024

## 2024-08-01 ENCOUNTER — Other Ambulatory Visit: Payer: Self-pay

## 2024-08-08 ENCOUNTER — Encounter (HOSPITAL_COMMUNITY)
Admission: RE | Admit: 2024-08-08 | Discharge: 2024-08-08 | Disposition: A | Discharge status: Home / Self Care | Source: Ambulatory Visit | Attending: Hematology | Admitting: Hematology

## 2024-08-08 DIAGNOSIS — C182 Malignant neoplasm of ascending colon: Secondary | ICD-10-CM | POA: Diagnosis present

## 2024-08-08 LAB — GLUCOSE, CAPILLARY: Glucose-Capillary: 135 mg/dL — ABNORMAL HIGH (ref 70–99)

## 2024-08-08 MED ORDER — FLUDEOXYGLUCOSE F - 18 (FDG) INJECTION
11.8000 | Freq: Once | INTRAVENOUS | Status: AC | PRN
  Administered 2024-08-08: 11.6 via INTRAVENOUS

## 2024-08-15 ENCOUNTER — Other Ambulatory Visit: Payer: Self-pay | Admitting: Hematology

## 2024-08-15 ENCOUNTER — Telehealth: Payer: Self-pay

## 2024-08-15 DIAGNOSIS — C182 Malignant neoplasm of ascending colon: Secondary | ICD-10-CM

## 2024-08-15 NOTE — Telephone Encounter (Addendum)
 Contacted patient via telephone call. Went over provider's comments from below.  Patient voiced understanding and agreed to proceed w/ biopsy. Let patient know that someone will be contacting him to get the biopsy scheduled.   ----- Message from Onita Mattock sent at 08/15/2024  4:01 PM EDT ----- Regarding: RE: Biopsy Review CT biopsy order is in, please schedule.  Baruch, please call pt and let him know that I recommend biopsy one of the hypermetabolic pelvic nodes which is slightly larger than last year on PET, no other new suspicious lesion. If he agrees with biopsy, please schedule and OV 3-4 days after biopsy  Jodie Onita ----- Message ----- From: Hughes Simmonds, MD Sent: 08/15/2024   2:31 PM EDT To: Onita Mattock, MD; Melissa Xayasine; Anders P Me# Subject: Biopsy Review                                  @Dr  Mattock. Pls place the BX order, if you haven't yet, and this approval will be posted for it.  ---  PROCEDURE / BIOPSY REVIEW Date: 08/15/24  Requested Biopsy site: R iliac chain LN Reason for request: HM on PET. Hx of colon CA Imaging review: Best seen on PET CT  Decision: Approved Imaging modality to perform: CT Schedule with: Moderate Sedation Schedule for: Any VIR  Additional comments:  @VIR : R iliac chain LN. Proposed trajectory on PET CT #4, 159 @Schedulers . CT R iliac chain LN BX. Mod sed  Please contact me with questions, concerns, or if issue pertaining to this request arise.  Simmonds Hughes, MD [facility] ----- Message ----- From: Mattock Onita, MD Sent: 08/14/2024  11:19 PM EDT To: Simmonds Hughes, MD; Anders SHAUNNA Scurry, RN  Jon,  When you have a chance, could you review his recent PET to see if you can biopsy his hypermetabolic right iliac node(s)? He had stage III colon cancer in 10/2021.  Jodie Onita

## 2024-08-16 ENCOUNTER — Encounter (HOSPITAL_COMMUNITY): Payer: Self-pay

## 2024-08-16 NOTE — Progress Notes (Signed)
 PROCEDURE / BIOPSY REVIEW  Date: 08/15/24   Requested Biopsy site: R iliac chain LN  Reason for request: HM on PET. Hx of colon CA  Imaging review: Best seen on PET CT   Decision: Approved  Imaging modality to perform: CT  Schedule with: Moderate Sedation  Schedule for: Any VIR   Additional comments:  @VIR : R iliac chain LN. Proposed trajectory on PET CT #4, 159  @Schedulers . CT R iliac chain LN BX. Mod sed   Please contact me with questions, concerns, or if issue pertaining to this request arise.   Thom Hall, MD  [facility]  [facility]

## 2024-08-23 ENCOUNTER — Other Ambulatory Visit: Payer: Self-pay

## 2024-08-23 DIAGNOSIS — Z01812 Encounter for preprocedural laboratory examination: Secondary | ICD-10-CM

## 2024-08-26 NOTE — H&P (Signed)
 Chief Complaint: Hx colon cancer, newly found enlarged right common iliac lymph node - IR consulted for image guided biopsy  Referring Provider(s): Lanny Callander, MD  Supervising Physician: Philip Cornet  Patient Status: [facility] - Out-pt  History of Present Illness: George Watkins is a 73 y.o. male with hx of metastatic colon cancer. First diagnosed via colonoscopy 2022. He underwent partial colectomy with adjuvant chemotherapy in 2023. Surveillance CT from 07/19/24 shows new minimally enlarged right iliac side chain lymph nodes. Follow up PET scan 08/09/24 shows indeterminate right common iliac lymph node. IR now consulted for image guided biopsy of lymph node to rule out malignancy reoccurrence.  Today patient without complaint. Has been NPO.  *** Patient is Full Code  Past Medical History:  Diagnosis Date   Anemia    Arthritis    Colon cancer (HCC)    Coronary artery disease    GERD (gastroesophageal reflux disease)    Heart murmur    Hiatal hernia    Hypertension    Pneumonia    Sleep apnea     Past Surgical History:  Procedure Laterality Date   ANKLE SURGERY     CIRCUMCISION     COLONOSCOPY     HERNIA REPAIR     PORTACATH PLACEMENT N/A 12/09/2021   Procedure: INSERTION PORT-A-CATH with ultrasound;  Surgeon: Debby Hila, MD;  Location: [facility] ORS;  Service: General;  Laterality: N/A;    Allergies: Iodinated contrast media and Sulfa antibiotics  Medications: Prior to Admission medications   Medication Sig Start Date End Date Taking? Authorizing Provider  amLODipine  (NORVASC ) 5 MG tablet Take 5 mg by mouth daily. 08/06/20   [provider]  atorvastatin (LIPITOR) 10 MG tablet Take 10 mg by mouth at bedtime.    [provider]  cholecalciferol (VITAMIN D3) 25 MCG (1000 UNIT) tablet Take 1,000 Units by mouth daily.    [provider]  cloNIDine  (CATAPRES ) 0.2 MG tablet Take 0.2 mg by mouth 2 (two) times daily.    [provider]   cyclobenzaprine  (FLEXERIL ) 5 MG tablet Take 5 mg by mouth at bedtime as needed for muscle spasms.    [provider]  diphenhydrAMINE  (BENADRYL ) 50 MG tablet Take 1 tablet (50 mg total) by mouth at bedtime as needed for itching. Take 1 tablet by mouth 1 hour prior to scan Patient not taking: Reported on 06/27/2024 06/07/23   Lanny Callander, MD  ferrous sulfate  325 (65 FE) MG tablet Take 1 tablet (325 mg total) by mouth 2 (two) times daily with a meal. 05/22/20   Freddi Hamilton, MD  hydrochlorothiazide  (HYDRODIURIL ) 12.5 MG tablet Take 1 tablet by mouth daily.    [provider]  ketoconazole  (NIZORAL ) 2 % cream Apply 1 Application topically 2 (two) times daily. 04/25/24   Silva Cornet SAUNDERS, DPM  KLOR-CON  M10 10 MEQ tablet TAKE 1 TABLET BY MOUTH TWICE A DAY 08/11/22   Lanny Callander, MD  labetalol  (NORMODYNE ) 200 MG tablet Take 200 mg by mouth 2 (two) times daily.    [provider]  losartan  (COZAAR ) 100 MG tablet Take 0.5 tablets by mouth daily.    [provider]  losartan -hydrochlorothiazide  (HYZAAR) 100-25 MG tablet Take 1 tablet by mouth daily.    [provider]  Multiple Vitamins-Minerals (MULTIVITAMIN MEN 50+ PO) Take 1 tablet by mouth daily.    [provider]  predniSONE  (DELTASONE ) 50 MG tablet Take 1 tablet 13 hour, 7 hours, and 1 hour before the scan  07/12/24   Lanny Callander, MD     Family History  Problem Relation Age of Onset   Healthy Mother    Healthy Father    Colon cancer Neg Hx    Colon polyps Neg Hx    Esophageal cancer Neg Hx    Rectal cancer Neg Hx    Stomach cancer Neg Hx     Social History   Socioeconomic History   Marital status: Married    Spouse name: Not on file   Number of children: Not on file   Years of education: Not on file   Highest education level: Not on file  Occupational History   Not on file  Tobacco Use   Smoking status: Some Days    Types: Cigars   Smokeless tobacco: Former  [employer]vertising [employer]   Vaping  status: Never Used  Substance and Sexual Activity   Alcohol use: Yes    Comment: rare   Drug use: Not Currently   Sexual activity: Not on file  Other Topics Concern   Not on file  Social History Narrative   Not on file   Social Drivers of Health   Financial Resource Strain: Not on file  Food Insecurity: Not on file  Transportation Needs: Not on file  Physical Activity: Not on file  Stress: Not on file  Social Connections: Not on file     Review of Systems: A 12 point ROS discussed and pertinent positives are indicated in the HPI above.  All other systems are negative.  Review of Systems  Vital Signs: There were no vitals taken for this visit.  Advance Care Plan: The advanced care place/surrogate decision maker was discussed at the time of visit and the patient did not wish to discuss or was not able to name a surrogate decision maker or provide an advance care plan.  Physical Exam  Imaging: NM PET Image Restag (PS) Skull Base To Thigh Result Date: 08/09/2024 CLINICAL DATA:  Subsequent treatment strategy for colon cancer. EXAM: NUCLEAR MEDICINE PET SKULL BASE TO THIGH TECHNIQUE: 11.6 mCi F-18 FDG was injected intravenously. Full-ring PET imaging was performed from the skull base to thigh after the radiotracer. CT data was obtained and used for attenuation correction and anatomic localization. Fasting blood glucose: 135 mg/dl COMPARISON:  PET-CT 7/70/7975 and CT scan 07/19/2024 FINDINGS: Mediastinal blood pool activity: SUV max 2.8 Liver activity: SUV max NA NECK: Small bilateral level IIa lymph nodes are once again observed, index node 0.8 cm in short axis on image 25 series 4 with maximum SUV 4.7, previously 0.9 cm with maximum SUV 3.1. Small level III and level V lymph nodes are likewise observed and similar to prior. Incidental CT findings: Bilateral common carotid atheromatous vascular calcifications. CHEST: Small clustered left axillary lymph nodes remain faintly metabolic.  Index left axillary node 0.8 cm in short axis on image 56 series 4 with maximum SUV 3.1, previously the same size with maximum SUV 4.7. Likely benign. Incidental CT findings: Right Port-A-Cath tip: SVC. Atheromatous vascular calcification of the aorta and branch vessels. Mild cardiomegaly. ABDOMEN/PELVIS: Scattered bowel activity presumed physiologic. The right common iliac lymph node of concern on recent CT measures 1.0 cm in short axis on image 152 series 4 (stable) with maximum SUV 4.2. Right external iliac node 0.7 cm in short axis on image 165 series 4 with maximum SUV 5.3. These are increased in size from prior PET-CT but stable from the CT examination from 07/19/2024. Nonspecific for reactive versus malignant etiology,  surveillance suggested. Incidental CT findings: 2.3 cm hyperdense left kidney lower pole exophytic lesion with internal density 63 Hounsfield units, photopenic on PET favoring a benign etiology such as complex cyst. Right hemicolectomy. Prostatomegaly. SKELETON: No significant abnormal hypermetabolic activity in this region. Incidental CT findings: Spondylosis. IMPRESSION: 1. The right common iliac lymph node of concern on recent CT measures 1.0 cm in short axis with maximum SUV 4.2. Right external iliac node 0.7 cm in short axis with maximum SUV 5.3. These are increased in size from prior PET-CT but stable from the CT examination from 07/19/2024. Nonspecific for reactive versus malignant etiology, surveillance suggested. 2. Small bilateral level IIA lymph nodes are once again observed, index node 0.8 cm in short axis with maximum SUV 4.7, previously 0.9 cm with maximum SUV 3.1. 3. Small clustered left axillary lymph nodes remain faintly metabolic, likely benign. 4. 2.3 cm hyperdense left kidney lower pole exophytic lesion with internal density 63 Hounsfield units, photopenic on PET favoring a benign etiology such as complex cyst. 5. Prostatomegaly. 6.  Aortic Atherosclerosis (ICD10-I70.0).  Electronically Signed   By: Ryan Salvage M.D.   On: 08/09/2024 12:34    Labs:  CBC: Recent Labs    11/16/23 1119 03/07/24 1113 05/02/24 1153 06/27/24 1050  WBC 6.9 8.6 8.8 7.7  HGB 11.3* 10.9* 11.1* 12.1*  HCT 37.2* 34.8* 35.1* 38.0*  PLT 192 189 233 234    COAGS: No results for input(s): INR, APTT in the last 8760 hours.  BMP: Recent Labs    11/16/23 1119 03/07/24 1113 05/02/24 1153 06/27/24 1050  NA 136 141 141 139  K 3.5 3.3* 3.1* 3.3*  CL 99 105 104 102  CO2 30 33* 34* 33*  GLUCOSE 252* 112* 130* 173*  BUN 13 10 11 12   CALCIUM  8.8* 8.6* 9.2 9.0  CREATININE 1.25* 1.17 1.16 1.11  GFRNONAA >60 >60 >60 >60    LIVER FUNCTION TESTS: Recent Labs    11/16/23 1119 03/07/24 1113 05/02/24 1153 06/27/24 1050  BILITOT 1.0 1.0 1.0 0.8  AST 16 27 22 23   ALT 14 29 20 22   ALKPHOS 81 93 70 78  PROT 7.0 6.9 7.4 7.9  ALBUMIN 3.5 3.5 3.5 3.6    TUMOR MARKERS: Recent Labs    11/16/23 1119 03/07/24 1113 05/02/24 1153  CEA <1.00 <1.00 <1.00    Assessment and Plan:  George Watkins is a 73 y.o. male with hx of metastatic colon cancer. First diagnosed via colonoscopy 2022. He underwent partial colectomy with adjuvant chemotherapy in 2023. Surveillance CT from 07/19/24 shows new minimally enlarged right iliac side chain lymph nodes. Follow up PET scan 08/09/24 shows indeterminate right common iliac lymph node. IR now consulted for image guided biopsy of lymph node to rule out malignancy reoccurrence.  Today patient without complaint. Has been NPO.  Risks and benefits of right iliac lymph node biopsy was discussed with the patient and/or patient's family including, but not limited to bleeding, infection, damage to adjacent structures or low yield requiring additional tests.  All of the questions were answered and there is agreement to proceed.  Consent signed and in chart.   Thank you for allowing our service to participate in George Watkins 's  care.  Electronically Signed: Kimble VEAR Clas, PA-C   08/26/2024, 4:08 PM      I spent a total of {New PWEU:[id number]} {New Out-Pt:[id number]}  {Established Out-Pt:[id number]} in face to face in clinical consultation, greater than 50% of which was counseling/coordinating care for  right iliac lymph node biopsy.

## 2024-08-27 ENCOUNTER — Other Ambulatory Visit: Payer: Self-pay

## 2024-08-27 ENCOUNTER — Ambulatory Visit (HOSPITAL_COMMUNITY)
Admission: RE | Admit: 2024-08-27 | Discharge: 2024-08-27 | Disposition: A | Discharge status: Home / Self Care | Source: Ambulatory Visit | Attending: Hematology | Admitting: Hematology

## 2024-08-27 DIAGNOSIS — Z85038 Personal history of other malignant neoplasm of large intestine: Secondary | ICD-10-CM | POA: Insufficient documentation

## 2024-08-27 DIAGNOSIS — R590 Localized enlarged lymph nodes: Secondary | ICD-10-CM | POA: Diagnosis not present

## 2024-08-27 DIAGNOSIS — Z01812 Encounter for preprocedural laboratory examination: Secondary | ICD-10-CM | POA: Diagnosis present

## 2024-08-27 DIAGNOSIS — C182 Malignant neoplasm of ascending colon: Secondary | ICD-10-CM

## 2024-08-27 DIAGNOSIS — D472 Monoclonal gammopathy: Secondary | ICD-10-CM | POA: Insufficient documentation

## 2024-08-27 DIAGNOSIS — Z9221 Personal history of antineoplastic chemotherapy: Secondary | ICD-10-CM | POA: Diagnosis not present

## 2024-08-27 LAB — CBC
HCT: 40 % (ref 39.0–52.0)
Hemoglobin: 12.4 g/dL — ABNORMAL LOW (ref 13.0–17.0)
MCH: 26.8 pg (ref 26.0–34.0)
MCHC: 31 g/dL (ref 30.0–36.0)
MCV: 86.4 fL (ref 80.0–100.0)
Platelets: 218 K/uL (ref 150–400)
RBC: 4.63 MIL/uL (ref 4.22–5.81)
RDW: 13.4 % (ref 11.5–15.5)
WBC: 6.1 K/uL (ref 4.0–10.5)
nRBC: 0 % (ref 0.0–0.2)

## 2024-08-27 LAB — PROTIME-INR
INR: 1.1 (ref 0.8–1.2)
Prothrombin Time: 14.3 s (ref 11.4–15.2)

## 2024-08-27 MED ORDER — FENTANYL CITRATE (PF) 100 MCG/2ML IJ SOLN
INTRAMUSCULAR | Status: AC | PRN
  Administered 2024-08-27 (×2): 50 ug via INTRAVENOUS

## 2024-08-27 MED ORDER — MIDAZOLAM HCL 2 MG/2ML IJ SOLN
INTRAMUSCULAR | Status: AC
  Filled 2024-08-27: qty 2

## 2024-08-27 MED ORDER — MIDAZOLAM HCL (PF) 2 MG/2ML IJ SOLN
INTRAMUSCULAR | Status: AC | PRN
  Administered 2024-08-27 (×2): 1 mg via INTRAVENOUS

## 2024-08-27 MED ORDER — SODIUM CHLORIDE 0.9 % IV SOLN: INTRAVENOUS | Status: DC

## 2024-08-27 MED ORDER — LIDOCAINE 1 % OPTIME INJ - NO CHARGE
10.0000 mL | Freq: Once | INTRAMUSCULAR | Status: AC
  Administered 2024-08-27: 10 mL via INTRADERMAL
  Filled 2024-08-27: qty 10

## 2024-08-27 MED ORDER — FENTANYL CITRATE (PF) 100 MCG/2ML IJ SOLN
INTRAMUSCULAR | Status: AC
  Filled 2024-08-27: qty 2

## 2024-08-27 NOTE — Procedures (Signed)
 Interventional Radiology Procedure Note  Procedure: CT Guided Biopsy of right iliac lymph node  Complications: None  Estimated Blood Loss: < 10 mL  Findings: 18 G core biopsy of right iliac lymph node performed under CT guidance.  Two core samples obtained and sent to Pathology.  George Watkins. [place], M.D Pager:  [phone]

## 2024-08-30 LAB — SURGICAL PATHOLOGY

## 2024-09-02 NOTE — Assessment & Plan Note (Signed)
 pT3N2aMx, at least stage IIIB, MSS -Colonoscopy performed on 10/13/21 for IDA and abdominal pain showed 4 cm mass near hepatic flexure. Path confirmed adenocarcinoma. -Baseline CEA was elevated at 25.9 on 10/13/21 -PET scan on 11/09/21 showed: hypermetabolism to colon mass and adjacent mildly enlarged ileocolonic mesenteric lymph nodes. Otherwise, no definitive evidence of additional spread. -partial colectomy on 11/17/21 with Dr. Debby showed 5.6 cm invasive moderately differentiated adenocarcinoma invading into pericolic adipose. Margins negative, but metastatic carcinoma in 6 lymph nodes (6/15). Two additional tubular adenomas without high-grade dysplasia noted. MMR normal and MSI stable.  -given his multiple (6) positive lymph nodes, he completed 12 cycles adjuvant chemotherapy with FOLFOX 12/23/21 - 06/23/22. He tolerated well overall with mild side effects. 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 -Surveillance CT scan from June 15, 2023 showed no definitive evidence of cancer recurrence, there is small nonspecific bone lesions especially in the pelvic bone, stable from previous PET scan and not FDG avid, SPEP showed polyclonal increase in multiple immunoglobin -PET 07/2024 showed a few hypermetabolic pelvic nodes, biopsy 88/02/7973 was negative for cancer. - Will continue surveillance.

## 2024-09-03 ENCOUNTER — Inpatient Hospital Stay: Attending: Nurse Practitioner | Admitting: Hematology

## 2024-09-03 DIAGNOSIS — Z9221 Personal history of antineoplastic chemotherapy: Secondary | ICD-10-CM | POA: Insufficient documentation

## 2024-09-03 DIAGNOSIS — Z85038 Personal history of other malignant neoplasm of large intestine: Secondary | ICD-10-CM | POA: Insufficient documentation

## 2024-09-03 DIAGNOSIS — C182 Malignant neoplasm of ascending colon: Secondary | ICD-10-CM

## 2024-09-03 DIAGNOSIS — Z79899 Other long term (current) drug therapy: Secondary | ICD-10-CM | POA: Insufficient documentation

## 2024-09-03 DIAGNOSIS — R591 Generalized enlarged lymph nodes: Secondary | ICD-10-CM | POA: Insufficient documentation

## 2024-09-03 DIAGNOSIS — I10 Essential (primary) hypertension: Secondary | ICD-10-CM | POA: Insufficient documentation

## 2024-09-03 NOTE — Progress Notes (Signed)
 [facility]   [phone] Fax:([phone]   Clinic Follow up Note   Patient Care Team: Leontine Cramp, NP as PCP - General (Nurse Practitioner) Lanny Callander, MD as Consulting Physician (Oncology) 09/03/2024  I connected with George Watkins on 09/03/24 at  8:50 AM EST by telephone and verified that I am speaking with the correct person using two identifiers.   I discussed the limitations, risks, security and privacy concerns of performing an evaluation and management service by telephone and the availability of in person appointments. I also discussed with the patient that there may be a patient responsible charge related to this service. The patient expressed understanding and agreed to proceed.   Patient's location: Hospital (due to his wife's hospitalization) Provider's location:  Office    CHIEF COMPLAINT: Review biopsy results   CURRENT THERAPY: Cancer surveillance  Oncology history Cancer of right colon (HCC) pT3N2aMx, at least stage IIIB, MSS -Colonoscopy performed on 10/13/21 for IDA and abdominal pain showed 4 cm mass near hepatic flexure. Path confirmed adenocarcinoma. -Baseline CEA was elevated at 25.9 on 10/13/21 -PET scan on 11/09/21 showed: hypermetabolism to colon mass and adjacent mildly enlarged ileocolonic mesenteric lymph nodes. Otherwise, no definitive evidence of additional spread. -partial colectomy on 11/17/21 with Dr. Debby showed 5.6 cm invasive moderately differentiated adenocarcinoma invading into pericolic adipose. Margins negative, but metastatic carcinoma in 6 lymph nodes (6/15). Two additional tubular adenomas without high-grade dysplasia noted. MMR normal and MSI stable.  -given his multiple (6) positive lymph nodes, he completed 12 cycles adjuvant chemotherapy with FOLFOX 12/23/21 - 06/23/22. He tolerated well overall with mild side effects. 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 -Surveillance CT scan from June 15, 2023 showed no definitive evidence of cancer recurrence, there is small nonspecific bone lesions especially in the pelvic bone, stable from previous PET scan and not FDG avid, SPEP showed polyclonal increase in multiple immunoglobin -PET 07/2024 showed a few hypermetabolic pelvic nodes, biopsy 88/02/7973 was negative for cancer. - Will continue surveillance.   Assessment & Plan Colon cancer, status post treatment, no evidence of disease Stage 3B colon cancer diagnosed in 2022, currently with no evidence of disease. Recent lymph node biopsy showed no cancer cells, only reactive changes and increased plasma cells, likely benign. Previous workup for MGUS and multiple myeloma was negative. No additional tests like bone marrow biopsy are needed at this time. - Will schedule a scan next year to confirm stability of disease. - Will plan for an additional year of follow-up with lab tests if scan is stable. - Will coordinate port removal with Dr. Dasie, considering current circumstances with [name].  Reactive lymphadenopathy Lymph node biopsy showed reactive changes with increased plasma cells, likely benign. Previous workup for MGUS and multiple myeloma was negative. The cause of reactive lymph nodes is unclear, possibly due to inflammation. - Continue to monitor lymphadenopathy without additional testing at this time.  Essential hypertension Hypertension managed with multiple medications including amlodipine . - Continue current antihypertensive regimen.  Plan - I reviewed his lymph node biopsy results, which is negative for malignancy.  Biopsy showed increased plasma cells, his previous workup was negative for MGUS or multiple myeloma. - Will schedule his port flush next week and again in 8 weeks - Will see him back in March or 2026 as scheduled.   SUMMARY OF ONCOLOGIC HISTORY: Oncology History Overview Note   Cancer Staging  Colorectal cancer [facility]) Staging form: Colon and Rectum, AJCC 8th  Edition -  Pathologic stage from 11/17/2021: Stage IIIB (pT3, pN2a, cM0) - Signed by Lanny Callander, MD on 12/03/2021    Cancer of right colon [facility])  10/13/2021 Procedure   Upper GI endoscopy by Dr. Teressa for IDA and abdominal pain  Findings:  - The esophagus was normal. - The stomach was normal. - The examined duodenum was normal.   10/13/2021 Procedure   Colonoscopy Indications: Iron deficiency  - A fungating and ulcerated partially obstructing, clearly malignant mass was found at the hepatic flexure. The mass was circumferential and measured 4cm in length. Biopsies were taken with a cold forceps for histology and then the mucosa just distal to the mass was tattooed with an injection of Spot (carbon black). jar 1 Findings: - Two pedunculated polyps were found in the mid transverse colon. The polyps were 8 to 11 mm in size. These polyps were removed with a hot snare. Resection and retrieval were complete. jar 2 - Five pedunculated and sessile polyps were found in the distal transverse colon. The polyps were 4 to 14 mm in size. Two were removed with hot snare, three were removed with cold snare. Resection and retrieval were complete. jar 3 - A 20 mm polyp was found in the distal transverse colon. The polyp was pedunculated. The polyp was removed with a hot snare. Resection and retrieval were complete. jar 4 - The exam was otherwise without abnormality on direct and retroflexion views.   10/13/2021 Pathology Results   REPORT OF SURGICAL PATHOLOGY Accession: [id number]  Diagnosis 1. Hepatic Flexure Biopsy - ADENOCARCINOMA, SEE NOTE   10/21/2021 Imaging   CT CHEST ABDOMEN PELVIS W CONTRAST  IMPRESSION: 1. There is a circumferential mass of the mid ascending colon measuring approximately 5 cm in length, in keeping with newly diagnosed primary colon malignancy. 2. Adjacent to the colon mass, there is mesenteric fat stranding suggesting invasion beyond the muscularis as well as  abnormally enlarged mesocolonic lymph nodes. 3. Mildly prominent, asymmetric right external iliac lymph nodes, nonspecific although suspicious for early nodal metastatic disease. 4. Asymmetrically prominent left axillary lymph nodes, nonspecific, these nodes less favored to reflect metastatic disease given location in the absence of other evidence of metastatic disease above the diaphragm. Both external iliac and axillary lymph nodes could be characterized for abnormal metabolic activity by PET-CT. 5. No evidence of organ metastatic disease in the chest, abdomen, or pelvis. 6. Exophytic soft tissue attenuation lesion of the anterior midportion of the left kidney measuring 1.2 cm. Partially exophytic soft tissue attenuation lesion of the posteroinferior pole of the left kidney measuring 1.9 x 1.7 cm. These may reflect hemorrhagic or proteinaceous cysts but are incompletely characterized and incidental renal cell carcinoma is not excluded. Recommend initial renal ultrasound to establish definitively cystic character, multiphasic contrast enhanced CT or MRI may however be further required to exclude malignancy. 7. Prostatomegaly with thickening of the decompressed urinary bladder wall, likely due to chronic outlet obstruction.   Aortic Atherosclerosis (ICD10-I70.0).   10/28/2021 Initial Diagnosis   Colorectal cancer (HCC)   11/17/2021 Cancer Staging   Staging form: Colon and Rectum, AJCC 8th Edition - Pathologic stage from 11/17/2021: Stage IIIB (pT3, pN2a, cM0) - Signed by Lanny Callander, MD on 12/03/2021 Histologic grading system: 4 grade system Histologic grade (G): G2 Residual tumor (R): R0 - None   11/17/2021 Definitive Surgery   FINAL MICROSCOPIC DIAGNOSIS:   A. COLON, RIGHT, RESECTION:  Invasive moderately differentiated adenocarcinoma invading into  pericolic adipose (pT3)  Tumor measures 5.2 x 3.6 x  0.8 cm  Margins free of carcinoma and adenomatous change  Six of fifteen  pericolic lymph nodes with metastatic carcinoma (6/15, pN2a)  Two additional tubular adenomas without high-grade dysplasia, largest measuring 1.3 cm  Benign appendix   ADDENDUM:  Mismatch Repair Protein (IHC)  SUMMARY INTERPRETATION: NORMAL  Microsatellite Instability (MSI) Result: STABLE   12/23/2021 - 06/25/2022 Chemotherapy   Patient is on Treatment Plan : COLORECTAL FOLFOX q14d x 6 months     11/25/2022 Imaging    IMPRESSION: 1. No definitive findings to suggest metastatic disease in the chest, abdomen or pelvis. Prominent borderline enlarged lymph nodes in the portacaval and hepatoduodenal ligament, stable compared to the prior examination, nonspecific. Continued attention on follow-up studies is recommended. 2. Multiple prominent borderline enlarged left axillary lymph nodes, stable compared to prior studies, nonspecific but favored to be benign given their stability in size. 3. Colonic diverticulosis without evidence of acute diverticulitis at this time. 4. Aortic atherosclerosis. 5. Additional incidental findings, as above.     Discussed the use of AI scribe software for clinical note transcription with the patient, who gave verbal consent to proceed.  History of Present Illness George Watkins is a 73 year old male with stage 3B colon cancer who presents for follow-up after a lymph node biopsy.  A recent lymph node biopsy was performed following his diagnosis of stage 3B colon cancer in 2022. He has a history of increased serum light chain levels, with previous workups for MGUS and multiple myeloma being negative. He experiences occasional stomach discomfort, particularly below the navel, which sometimes occurs depending on his diet, and nausea when consuming bananas or oranges. He denies issues with bowel movements. His current medications include amlodipine , a cholesterol medication, and vitamin D. He experiences difficulty with prolonged standing due to lower back  pain.     REVIEW OF SYSTEMS:   Constitutional: Denies fevers, chills or abnormal weight loss Eyes: Denies blurriness of vision Ears, nose, mouth, throat, and face: Denies mucositis or sore throat Respiratory: Denies cough, dyspnea or wheezes Cardiovascular: Denies palpitation, chest discomfort or lower extremity swelling Gastrointestinal:  Denies nausea, heartburn or change in bowel habits Skin: Denies abnormal skin rashes Lymphatics: Denies new lymphadenopathy or easy bruising Neurological:Denies numbness, tingling or new weaknesses Behavioral/Psych: Mood is stable, no new changes  All other systems were reviewed with the patient and are negative.  MEDICAL HISTORY:  Past Medical History:  Diagnosis Date   Anemia    Arthritis    Colon cancer (HCC)    Coronary artery disease    GERD (gastroesophageal reflux disease)    Heart murmur    Hiatal hernia    Hypertension    Pneumonia    Sleep apnea     SURGICAL HISTORY: Past Surgical History:  Procedure Laterality Date   ANKLE SURGERY     CIRCUMCISION     COLONOSCOPY     HERNIA REPAIR     PORTACATH PLACEMENT N/A 12/09/2021   Procedure: INSERTION PORT-A-CATH with ultrasound;  Surgeon: Debby Hila, MD;  Location: [facility];  Service: General;  Laterality: N/A;    I have reviewed the social history and family history with the patient and they are unchanged from previous note.  ALLERGIES:  is allergic to iodinated contrast media and sulfa antibiotics.  MEDICATIONS:  Current Outpatient Medications  Medication Sig Dispense Refill   amLODipine  (NORVASC ) 5 MG tablet Take 5 mg by mouth daily.     atorvastatin (LIPITOR) 10 MG tablet Take 10 mg by  mouth at bedtime.     cholecalciferol (VITAMIN D3) 25 MCG (1000 UNIT) tablet Take 1,000 Units by mouth daily.     cloNIDine  (CATAPRES ) 0.2 MG tablet Take 0.2 mg by mouth 2 (two) times daily.     cyclobenzaprine  (FLEXERIL ) 5 MG tablet Take 5 mg by mouth at bedtime as needed for muscle  spasms.     diphenhydrAMINE  (BENADRYL ) 50 MG tablet Take 1 tablet (50 mg total) by mouth at bedtime as needed for itching. Take 1 tablet by mouth 1 hour prior to scan (Patient not taking: Reported on 06/27/2024) 1 tablet 0   ferrous sulfate  325 (65 FE) MG tablet Take 1 tablet (325 mg total) by mouth 2 (two) times daily with a meal. 30 tablet 0   hydrochlorothiazide  (HYDRODIURIL ) 12.5 MG tablet Take 1 tablet by mouth daily.     ketoconazole  (NIZORAL ) 2 % cream Apply 1 Application topically 2 (two) times daily. 60 g 2   KLOR-CON  M10 10 MEQ tablet TAKE 1 TABLET BY MOUTH TWICE A DAY 180 tablet 1   labetalol  (NORMODYNE ) 200 MG tablet Take 200 mg by mouth 2 (two) times daily.     losartan  (COZAAR ) 100 MG tablet Take 0.5 tablets by mouth daily.     losartan -hydrochlorothiazide  (HYZAAR) 100-25 MG tablet Take 1 tablet by mouth daily.     Multiple Vitamins-Minerals (MULTIVITAMIN MEN 50+ PO) Take 1 tablet by mouth daily.     predniSONE  (DELTASONE ) 50 MG tablet Take 1 tablet 13 hour, 7 hours, and 1 hour before the scan 3 tablet 1   No current facility-administered medications for this visit.    PHYSICAL EXAMINATION: Not performed   LABORATORY DATA:  I have reviewed the data as listed    Latest Ref Rng & Units 08/27/2024    9:17 AM 06/27/2024   10:50 AM 05/02/2024   11:53 AM  CBC  WBC 4.0 - 10.5 K/uL 6.1  7.7  8.8   Hemoglobin 13.0 - 17.0 g/dL 87.5  87.8  88.8   Hematocrit 39.0 - 52.0 % 40.0  38.0  35.1   Platelets 150 - 400 K/uL 218  234  233         Latest Ref Rng & Units 06/27/2024   10:50 AM 05/02/2024   11:53 AM 03/07/2024   11:13 AM  CMP  Glucose 70 - 99 mg/dL 826  869  887   BUN 8 - 23 mg/dL 12  11  10    Creatinine 0.61 - 1.24 mg/dL 8.88  8.83  8.82   Sodium 135 - 145 mmol/L 139  141  141   Potassium 3.5 - 5.1 mmol/L 3.3  3.1  3.3   Chloride 98 - 111 mmol/L 102  104  105   CO2 22 - 32 mmol/L 33  34  33   Calcium  8.9 - 10.3 mg/dL 9.0  9.2  8.6   Total Protein 6.5 - 8.1 g/dL 7.9  7.4   6.9   Total Bilirubin 0.0 - 1.2 mg/dL 0.8  1.0  1.0   Alkaline Phos 38 - 126 U/L 78  70  93   AST 15 - 41 U/L 23  22  27    ALT 0 - 44 U/L 22  20  29        RADIOGRAPHIC STUDIES: I have personally reviewed the radiological images as listed and agreed with the findings in the report. No results found.     I discussed the assessment and treatment plan with the patient.  The patient was provided an opportunity to ask questions and all were answered. The patient agreed with the plan and demonstrated an understanding of the instructions.   The patient was advised to call back or seek an in-person evaluation if the symptoms worsen or if the condition fails to improve as anticipated.  I provided 15 minutes of non face-to-face telephone visit time during this encounter, including review of chart and various tests results, discussions about plan of care and coordination of care plan.    Onita Mattock, MD 09/03/24

## 2024-09-20 ENCOUNTER — Inpatient Hospital Stay

## 2024-09-20 DIAGNOSIS — R591 Generalized enlarged lymph nodes: Secondary | ICD-10-CM | POA: Diagnosis not present

## 2024-09-20 DIAGNOSIS — Z9221 Personal history of antineoplastic chemotherapy: Secondary | ICD-10-CM | POA: Diagnosis not present

## 2024-09-20 DIAGNOSIS — I10 Essential (primary) hypertension: Secondary | ICD-10-CM | POA: Diagnosis not present

## 2024-09-20 DIAGNOSIS — D472 Monoclonal gammopathy: Secondary | ICD-10-CM

## 2024-09-20 DIAGNOSIS — D500 Iron deficiency anemia secondary to blood loss (chronic): Secondary | ICD-10-CM

## 2024-09-20 DIAGNOSIS — C182 Malignant neoplasm of ascending colon: Secondary | ICD-10-CM

## 2024-09-20 DIAGNOSIS — C19 Malignant neoplasm of rectosigmoid junction: Secondary | ICD-10-CM

## 2024-09-20 DIAGNOSIS — Z79899 Other long term (current) drug therapy: Secondary | ICD-10-CM | POA: Diagnosis not present

## 2024-09-20 DIAGNOSIS — Z85038 Personal history of other malignant neoplasm of large intestine: Secondary | ICD-10-CM | POA: Diagnosis present

## 2024-09-20 LAB — CBC WITH DIFFERENTIAL (CANCER CENTER ONLY)
Abs Immature Granulocytes: 0.01 K/uL (ref 0.00–0.07)
Basophils Absolute: 0 K/uL (ref 0.0–0.1)
Basophils Relative: 1 %
Eosinophils Absolute: 0.1 K/uL (ref 0.0–0.5)
Eosinophils Relative: 2 %
HCT: 35.5 % — ABNORMAL LOW (ref 39.0–52.0)
Hemoglobin: 11.3 g/dL — ABNORMAL LOW (ref 13.0–17.0)
Immature Granulocytes: 0 %
Lymphocytes Relative: 23 %
Lymphs Abs: 1.3 K/uL (ref 0.7–4.0)
MCH: 26.8 pg (ref 26.0–34.0)
MCHC: 31.8 g/dL (ref 30.0–36.0)
MCV: 84.1 fL (ref 80.0–100.0)
Monocytes Absolute: 0.7 K/uL (ref 0.1–1.0)
Monocytes Relative: 11 %
Neutro Abs: 3.7 K/uL (ref 1.7–7.7)
Neutrophils Relative %: 63 %
Platelet Count: 185 K/uL (ref 150–400)
RBC: 4.22 MIL/uL (ref 4.22–5.81)
RDW: 13.2 % (ref 11.5–15.5)
WBC Count: 5.8 K/uL (ref 4.0–10.5)
nRBC: 0 % (ref 0.0–0.2)

## 2024-09-20 LAB — CMP (CANCER CENTER ONLY)
ALT: 30 U/L (ref 0–44)
AST: 34 U/L (ref 15–41)
Albumin: 3.7 g/dL (ref 3.5–5.0)
Alkaline Phosphatase: 87 U/L (ref 38–126)
Anion gap: 9 (ref 5–15)
BUN: 14 mg/dL (ref 8–23)
CO2: 27 mmol/L (ref 22–32)
Calcium: 8.8 mg/dL — ABNORMAL LOW (ref 8.9–10.3)
Chloride: 103 mmol/L (ref 98–111)
Creatinine: 1.07 mg/dL (ref 0.61–1.24)
GFR, Estimated: >60 mL/min (ref >60)
Glucose, Bld: 185 mg/dL — ABNORMAL HIGH (ref 70–99)
Potassium: 3.7 mmol/L (ref 3.5–5.1)
Sodium: 139 mmol/L (ref 135–145)
Total Bilirubin: 0.9 mg/dL (ref 0.0–1.2)
Total Protein: 7.4 g/dL (ref 6.5–8.1)

## 2024-09-23 LAB — CEA (ACCESS): CEA (CHCC): <1 ng/mL (ref 0.00–5.00)

## 2024-11-15 ENCOUNTER — Inpatient Hospital Stay: Attending: Nurse Practitioner

## 2024-11-15 DIAGNOSIS — D472 Monoclonal gammopathy: Secondary | ICD-10-CM

## 2024-11-15 DIAGNOSIS — Z85038 Personal history of other malignant neoplasm of large intestine: Secondary | ICD-10-CM | POA: Insufficient documentation

## 2024-11-15 DIAGNOSIS — C182 Malignant neoplasm of ascending colon: Secondary | ICD-10-CM

## 2024-11-15 DIAGNOSIS — D500 Iron deficiency anemia secondary to blood loss (chronic): Secondary | ICD-10-CM

## 2024-11-15 DIAGNOSIS — C19 Malignant neoplasm of rectosigmoid junction: Secondary | ICD-10-CM

## 2024-11-15 LAB — CMP (CANCER CENTER ONLY)
ALT: 29 U/L (ref 0–44)
AST: 32 U/L (ref 15–41)
Albumin: 3.9 g/dL (ref 3.5–5.0)
Alkaline Phosphatase: 98 U/L (ref 38–126)
Anion gap: 9 (ref 5–15)
BUN: 9 mg/dL (ref 8–23)
CO2: 29 mmol/L (ref 22–32)
Calcium: 9.1 mg/dL (ref 8.9–10.3)
Chloride: 104 mmol/L (ref 98–111)
Creatinine: 1.27 mg/dL — ABNORMAL HIGH (ref 0.61–1.24)
GFR, Estimated: 60 mL/min — ABNORMAL LOW
Glucose, Bld: 140 mg/dL — ABNORMAL HIGH (ref 70–99)
Potassium: 3.5 mmol/L (ref 3.5–5.1)
Sodium: 142 mmol/L (ref 135–145)
Total Bilirubin: 0.9 mg/dL (ref 0.0–1.2)
Total Protein: 7.8 g/dL (ref 6.5–8.1)

## 2024-11-15 LAB — CBC WITH DIFFERENTIAL (CANCER CENTER ONLY)
Abs Immature Granulocytes: 0.02 K/uL (ref 0.00–0.07)
Basophils Absolute: 0 K/uL (ref 0.0–0.1)
Basophils Relative: 1 %
Eosinophils Absolute: 0.2 K/uL (ref 0.0–0.5)
Eosinophils Relative: 2 %
HCT: 36.6 % — ABNORMAL LOW (ref 39.0–52.0)
Hemoglobin: 11.5 g/dL — ABNORMAL LOW (ref 13.0–17.0)
Immature Granulocytes: 0 %
Lymphocytes Relative: 18 %
Lymphs Abs: 1.2 K/uL (ref 0.7–4.0)
MCH: 26.6 pg (ref 26.0–34.0)
MCHC: 31.4 g/dL (ref 30.0–36.0)
MCV: 84.7 fL (ref 80.0–100.0)
Monocytes Absolute: 0.6 K/uL (ref 0.1–1.0)
Monocytes Relative: 10 %
Neutro Abs: 4.5 K/uL (ref 1.7–7.7)
Neutrophils Relative %: 69 %
Platelet Count: 213 K/uL (ref 150–400)
RBC: 4.32 MIL/uL (ref 4.22–5.81)
RDW: 13.5 % (ref 11.5–15.5)
WBC Count: 6.6 K/uL (ref 4.0–10.5)
nRBC: 0 % (ref 0.0–0.2)

## 2024-11-15 LAB — CEA (ACCESS): CEA (CHCC): <1 ng/mL (ref 0.00–5.00)

## 2024-11-18 ENCOUNTER — Ambulatory Visit: Payer: Self-pay | Admitting: Hematology

## 2024-11-18 NOTE — Telephone Encounter (Addendum)
 Called patient as per Dr. Lanny to relay lab results below, patient voiced full understanding and had no further questions at this time.   ----- Message from Onita Lanny, MD sent at 11/18/2024  2:52 PM EST ----- Please let pt know his lab results, overall stable mild anemia and otherwise unremarkable, thx  Onita Lanny

## 2024-12-26 ENCOUNTER — Other Ambulatory Visit

## 2024-12-26 ENCOUNTER — Ambulatory Visit: Admitting: Hematology
